# Patient Record
Sex: Male | Born: 1960 | Race: Black or African American | Hispanic: No | Marital: Married | Smoking: Never smoker
Health system: Southern US, Community
[De-identification: ages and names within clinical notes are randomized; demographics above are authoritative.]

## PROBLEM LIST (undated history)

## (undated) DIAGNOSIS — N400 Enlarged prostate without lower urinary tract symptoms: Secondary | ICD-10-CM

## (undated) DIAGNOSIS — G43909 Migraine, unspecified, not intractable, without status migrainosus: Secondary | ICD-10-CM

## (undated) DIAGNOSIS — C801 Malignant (primary) neoplasm, unspecified: Secondary | ICD-10-CM

---

## 1998-01-07 ENCOUNTER — Encounter: Payer: Self-pay | Admitting: Family Medicine

## 1998-01-07 ENCOUNTER — Ambulatory Visit (HOSPITAL_COMMUNITY): Admission: RE | Admit: 1998-01-07 | Discharge: 1998-01-07 | Payer: Self-pay | Admitting: Family Medicine

## 1998-01-17 ENCOUNTER — Ambulatory Visit (HOSPITAL_COMMUNITY): Admission: RE | Admit: 1998-01-17 | Discharge: 1998-01-17 | Payer: Self-pay | Admitting: Family Medicine

## 1998-01-17 ENCOUNTER — Encounter: Payer: Self-pay | Admitting: Family Medicine

## 1999-02-05 ENCOUNTER — Ambulatory Visit (HOSPITAL_COMMUNITY): Admission: RE | Admit: 1999-02-05 | Discharge: 1999-02-05 | Payer: Self-pay | Admitting: Family Medicine

## 2018-09-27 ENCOUNTER — Inpatient Hospital Stay (HOSPITAL_COMMUNITY)
Admission: EM | Admit: 2018-09-27 | Discharge: 2018-10-01 | DRG: 025 | Disposition: A | Discharge status: Home / Self Care | Payer: BC Managed Care – PPO | Attending: Neurosurgery | Admitting: Neurosurgery

## 2018-09-27 ENCOUNTER — Emergency Department (HOSPITAL_COMMUNITY): Payer: BC Managed Care – PPO

## 2018-09-27 ENCOUNTER — Encounter (HOSPITAL_COMMUNITY): Payer: Self-pay | Admitting: *Deleted

## 2018-09-27 ENCOUNTER — Inpatient Hospital Stay (HOSPITAL_COMMUNITY): Payer: BC Managed Care – PPO

## 2018-09-27 ENCOUNTER — Other Ambulatory Visit: Payer: Self-pay

## 2018-09-27 DIAGNOSIS — C719 Malignant neoplasm of brain, unspecified: Secondary | ICD-10-CM | POA: Diagnosis present

## 2018-09-27 DIAGNOSIS — Z20828 Contact with and (suspected) exposure to other viral communicable diseases: Secondary | ICD-10-CM | POA: Diagnosis present

## 2018-09-27 DIAGNOSIS — H53461 Homonymous bilateral field defects, right side: Secondary | ICD-10-CM | POA: Diagnosis present

## 2018-09-27 DIAGNOSIS — Z803 Family history of malignant neoplasm of breast: Secondary | ICD-10-CM

## 2018-09-27 DIAGNOSIS — N400 Enlarged prostate without lower urinary tract symptoms: Secondary | ICD-10-CM | POA: Diagnosis present

## 2018-09-27 DIAGNOSIS — C714 Malignant neoplasm of occipital lobe: Secondary | ICD-10-CM | POA: Diagnosis present

## 2018-09-27 DIAGNOSIS — G936 Cerebral edema: Secondary | ICD-10-CM | POA: Diagnosis present

## 2018-09-27 DIAGNOSIS — D496 Neoplasm of unspecified behavior of brain: Secondary | ICD-10-CM

## 2018-09-27 HISTORY — DX: Benign prostatic hyperplasia without lower urinary tract symptoms: N40.0

## 2018-09-27 LAB — APTT: aPTT: 33 seconds (ref 24–36)

## 2018-09-27 LAB — CBC WITH DIFFERENTIAL/PLATELET
Abs Immature Granulocytes: 0.04 10*3/uL (ref 0.00–0.07)
Basophils Absolute: 0 10*3/uL (ref 0.0–0.1)
Basophils Relative: 0 %
Eosinophils Absolute: 0 10*3/uL (ref 0.0–0.5)
Eosinophils Relative: 0 %
HCT: 42.4 % (ref 39.0–52.0)
Hemoglobin: 14.5 g/dL (ref 13.0–17.0)
Immature Granulocytes: 0 %
Lymphocytes Relative: 9 %
Lymphs Abs: 1 10*3/uL (ref 0.7–4.0)
MCH: 28.7 pg (ref 26.0–34.0)
MCHC: 34.2 g/dL (ref 30.0–36.0)
MCV: 83.8 fL (ref 80.0–100.0)
Monocytes Absolute: 0.6 10*3/uL (ref 0.1–1.0)
Monocytes Relative: 5 %
Neutro Abs: 9.1 10*3/uL — ABNORMAL HIGH (ref 1.7–7.7)
Neutrophils Relative %: 86 %
Platelets: 173 10*3/uL (ref 150–400)
RBC: 5.06 MIL/uL (ref 4.22–5.81)
RDW: 13.2 % (ref 11.5–15.5)
WBC: 10.7 10*3/uL — ABNORMAL HIGH (ref 4.0–10.5)
nRBC: 0 % (ref 0.0–0.2)

## 2018-09-27 LAB — BASIC METABOLIC PANEL WITH GFR
Anion gap: 11 (ref 5–15)
BUN: 11 mg/dL (ref 6–20)
CO2: 28 mmol/L (ref 22–32)
Calcium: 9 mg/dL (ref 8.9–10.3)
Chloride: 102 mmol/L (ref 98–111)
Creatinine, Ser: 1.28 mg/dL — ABNORMAL HIGH (ref 0.61–1.24)
GFR calc Af Amer: >60 mL/min
GFR calc non Af Amer: >60 mL/min
Glucose, Bld: 108 mg/dL — ABNORMAL HIGH (ref 70–99)
Potassium: 4.3 mmol/L (ref 3.5–5.1)
Sodium: 141 mmol/L (ref 135–145)

## 2018-09-27 LAB — C-REACTIVE PROTEIN: CRP: <0.8 mg/dL

## 2018-09-27 LAB — PROTIME-INR
INR: 1.2 (ref 0.8–1.2)
Prothrombin Time: 15.4 seconds — ABNORMAL HIGH (ref 11.4–15.2)

## 2018-09-27 LAB — SEDIMENTATION RATE: Sed Rate: 3 mm/hr (ref 0–16)

## 2018-09-27 IMAGING — CT CT CHEST WITH CONTRAST
2 of 5 series · 14 of 36 positions shown, 17 images · IV contrast (omnipaque)
Comparison: None.

CLINICAL DATA: Headache.  Left occipital mass.

EXAM:
CT CHEST, ABDOMEN, AND PELVIS WITH CONTRAST
TECHNIQUE: Multidetector CT imaging of the chest, abdomen and pelvis was
performed following the standard protocol during bolus
administration of intravenous contrast.
CONTRAST:  100mL OMNIPAQUE IOHEXOL 300 MG/ML  SOLN

[Series 3: cap with · axial · 0.73mm/px · z∈[-854,-254]mm · 11 of 137 slices shown, 14 images]
[im 9/137  mediastinal]
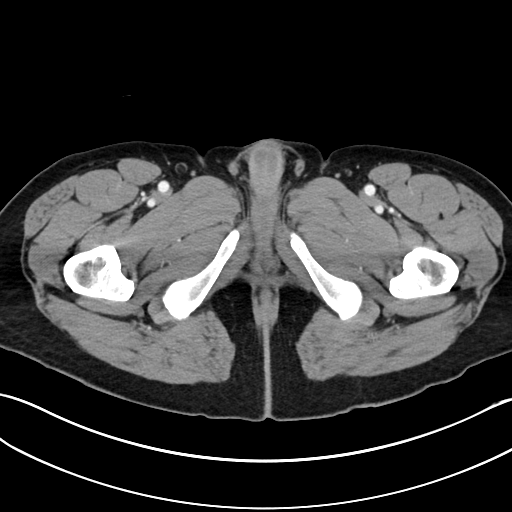
[im 9/137  lung]
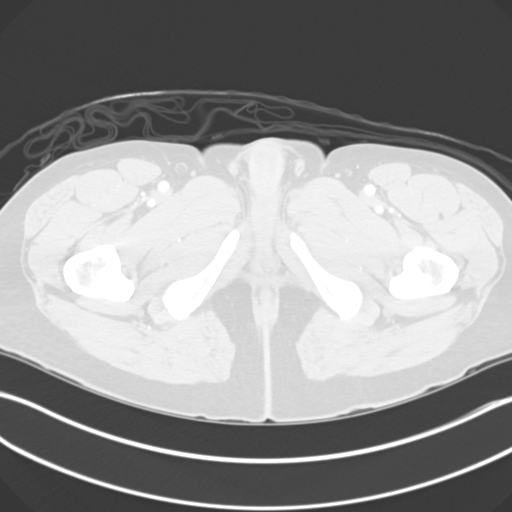
[im 25/137  lung]
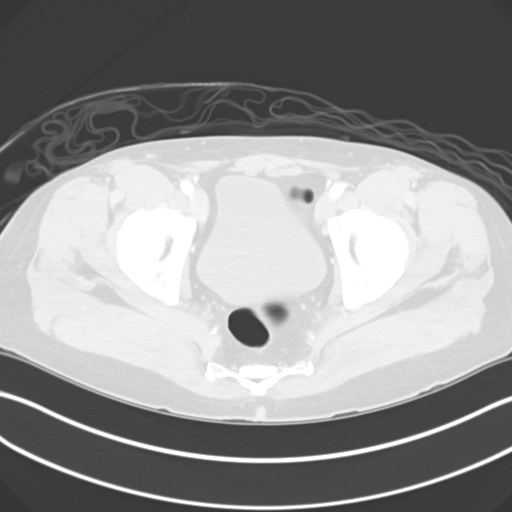
[im 33/137  lung]
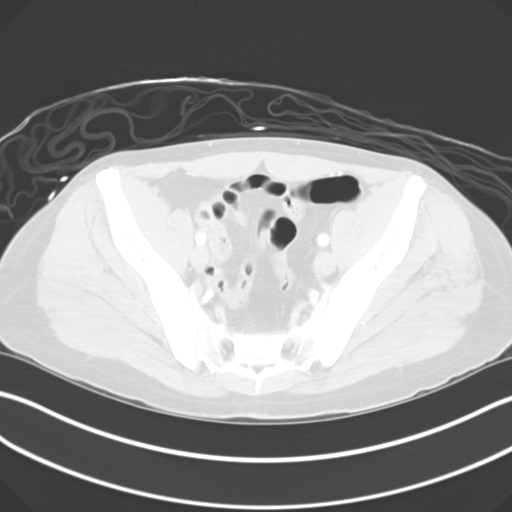
[im 49/137  lung]
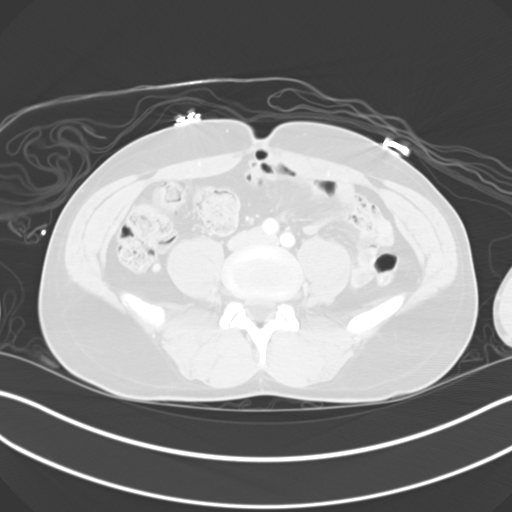
[im 57/137  mediastinal]
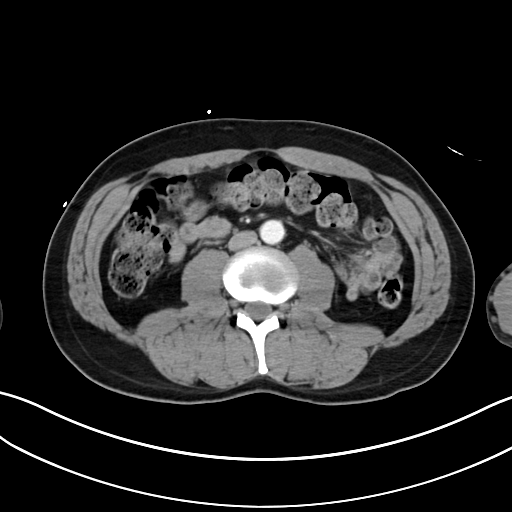
[im 57/137  lung]
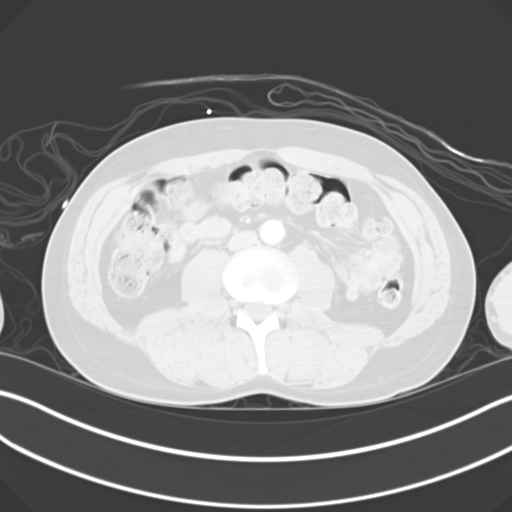
[im 73/137  lung]
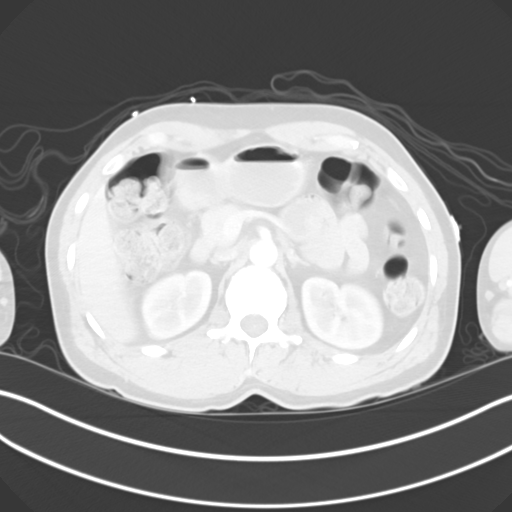
[im 81/137  lung]
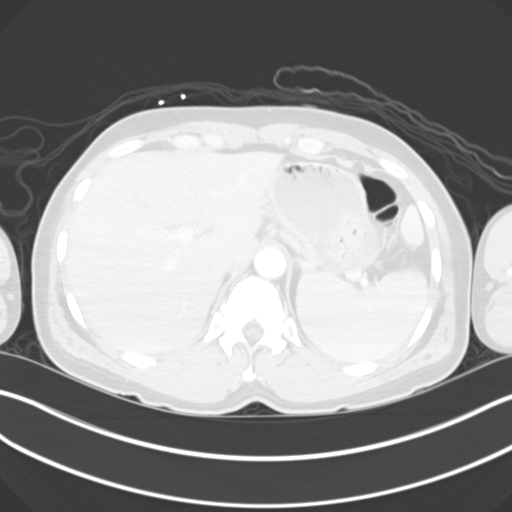
[im 89/137  lung]
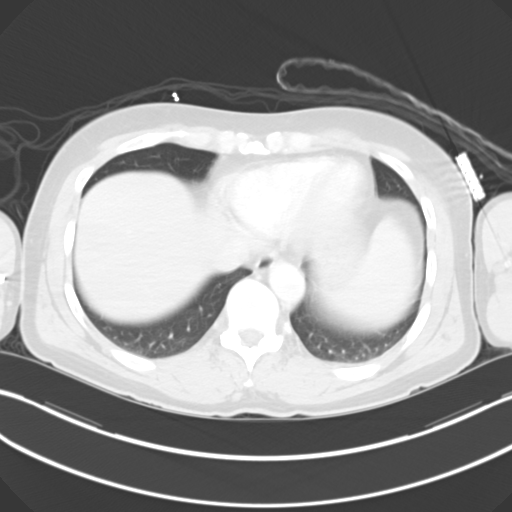
[im 105/137  mediastinal]
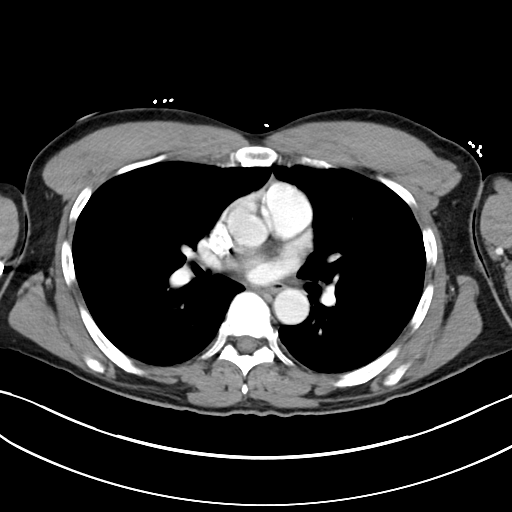
[im 105/137  lung]
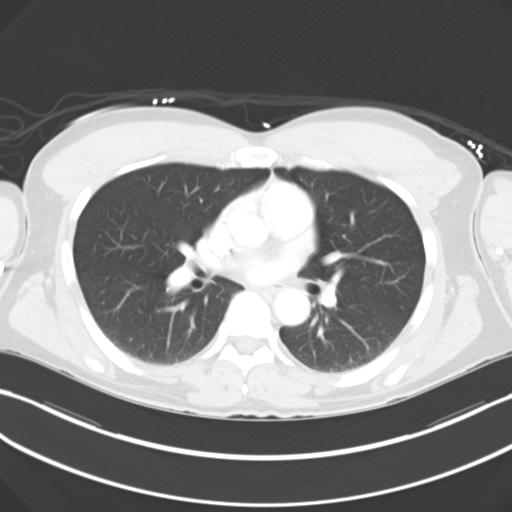
[im 113/137  lung]
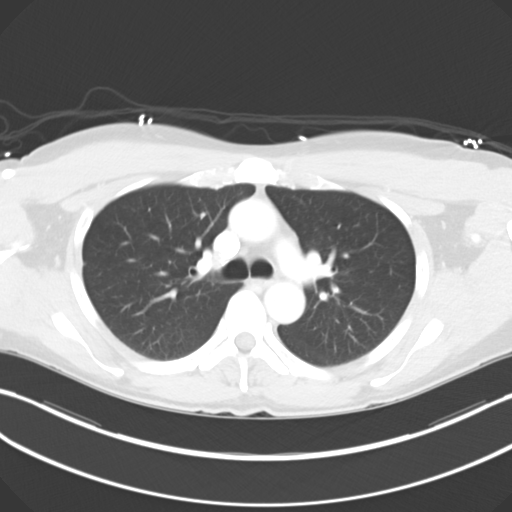
[im 129/137  lung]
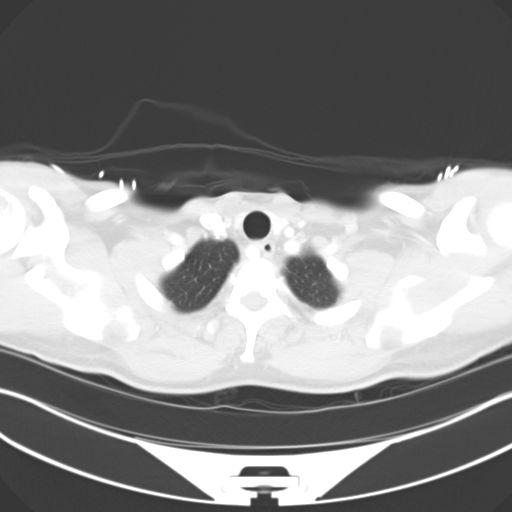

[Series 6: cor · coronal · 0.73mm/px · 3 of 88 slices shown]
[im 18/88  lung]
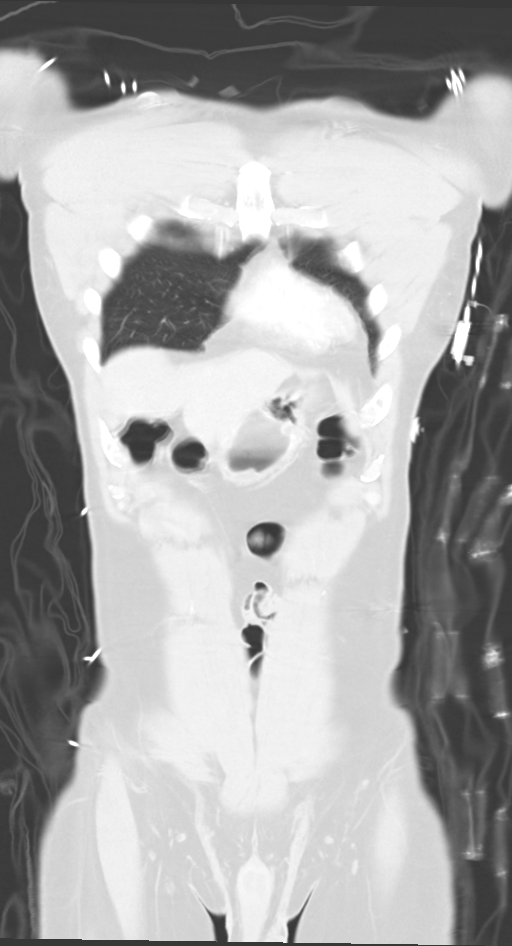
[im 35/88  lung]
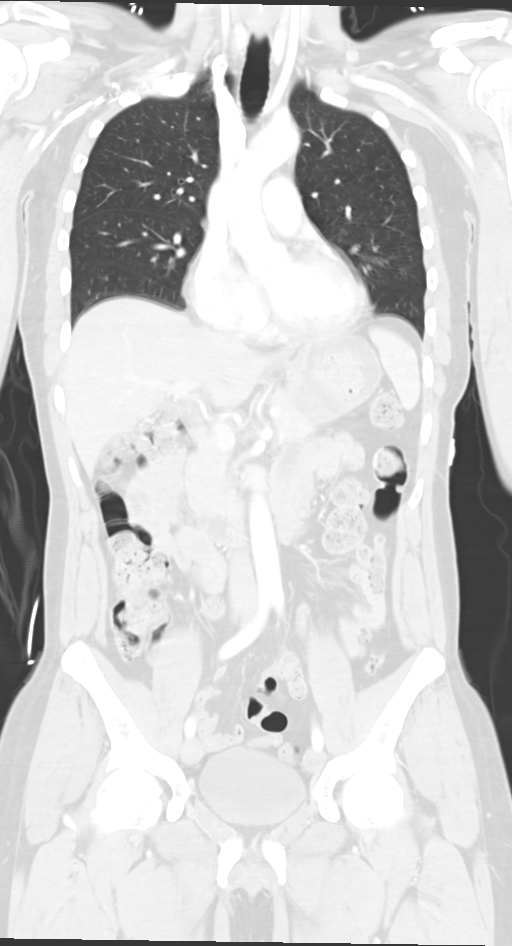
[im 53/88  lung]
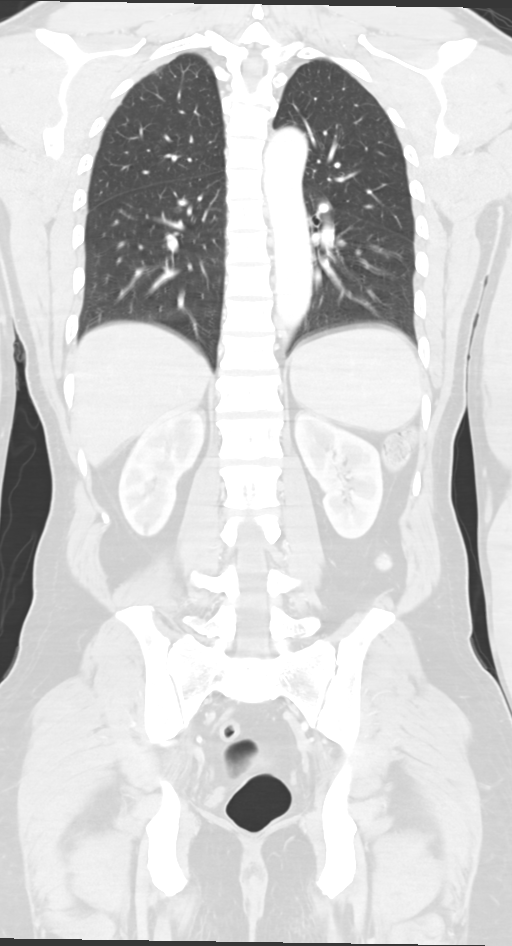

[14 of 36 positions shown; findings below may reference images not displayed]

FINDINGS: CT CHEST FINDINGS

Cardiovascular: Normal heart size.  No abnormal vascular finding.

Mediastinum/Nodes: No mass or adenopathy.

Lungs/Pleura: No pleural effusion. 3 mm subpleural nodule right
upper lobe image 13. 2 mm subpleural nodule right upper lobe image
26. No significant pulmonary finding.

Musculoskeletal: Normal

CT ABDOMEN PELVIS FINDINGS

Hepatobiliary: Normal

Pancreas: Normal

Spleen: Normal

Adrenals/Urinary Tract: Adrenal glands are normal. Kidneys are
normal. Bladder is normal.

Stomach/Bowel: No abnormal bowel finding.

Vascular/Lymphatic: Normal.  No atherosclerotic disease.

Reproductive: Normal

Other: No free fluid or air.

Musculoskeletal: Normal
IMPRESSION: Normal CT scan of the chest abdomen and pelvis.

## 2018-09-27 IMAGING — CT CT ABDOMEN AND PELVIS WITH CONTRAST
2 of 5 series · 15 of 46 positions shown, 17 images · IV contrast (omnipaque)
Comparison: None.

CLINICAL DATA: Headache.  Left occipital mass.

EXAM:
CT CHEST, ABDOMEN, AND PELVIS WITH CONTRAST
TECHNIQUE: Multidetector CT imaging of the chest, abdomen and pelvis was
performed following the standard protocol during bolus
administration of intravenous contrast.
CONTRAST:  100mL OMNIPAQUE IOHEXOL 300 MG/ML  SOLN

[Series 3: cap with · axial · 0.73mm/px · z∈[-844,-269]mm · 12 of 137 slices shown, 14 images]
[im 11/137  soft-tissue]
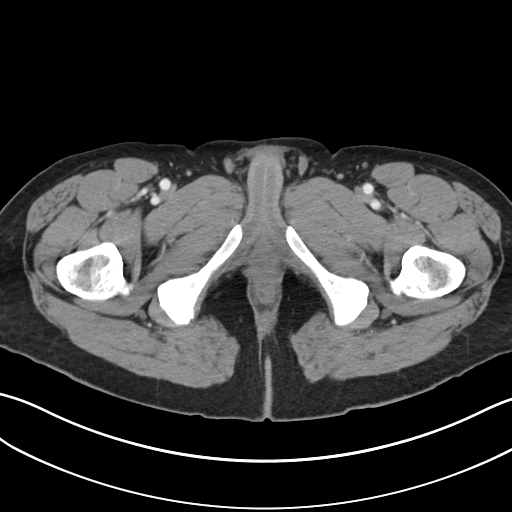
[im 11/137  bone]
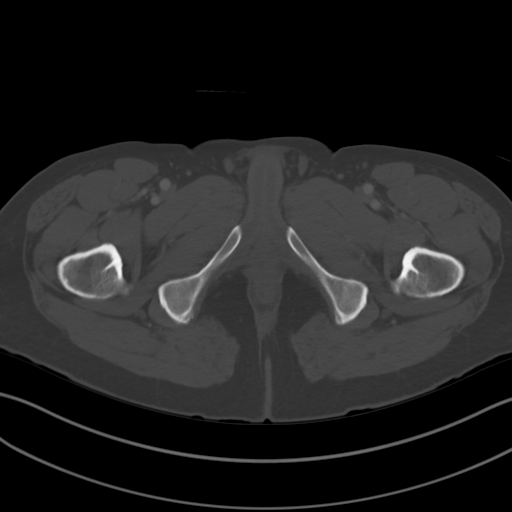
[im 21/137  soft-tissue]
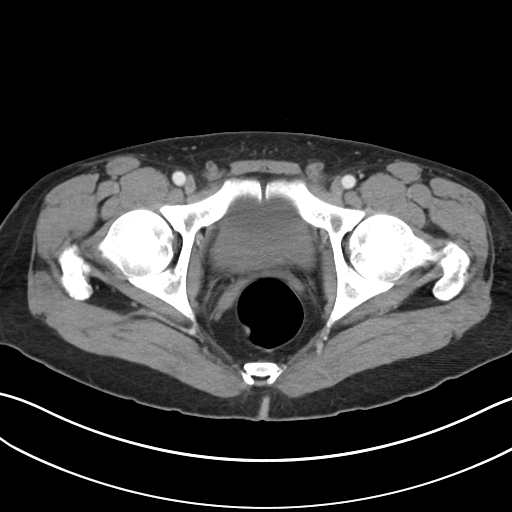
[im 32/137  soft-tissue]
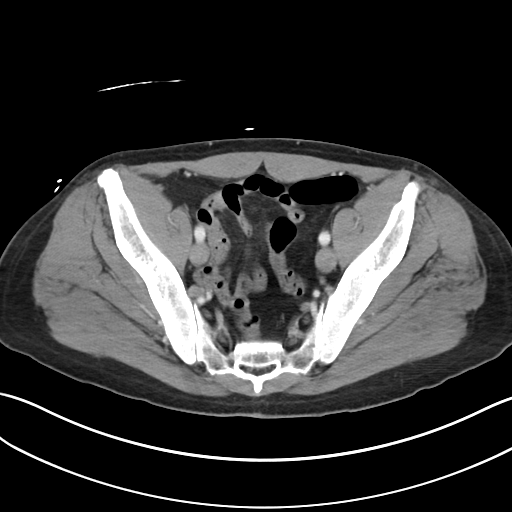
[im 42/137  soft-tissue]
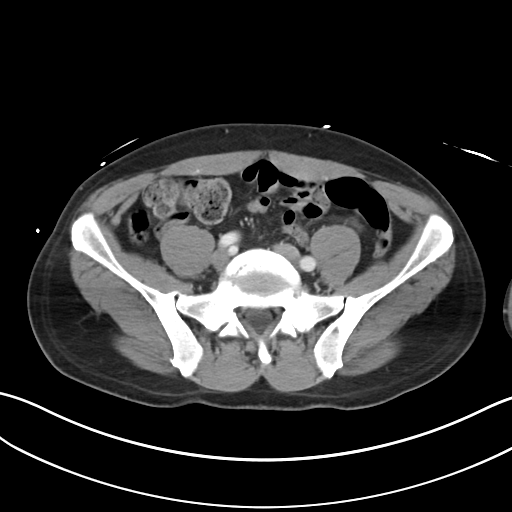
[im 53/137  soft-tissue]
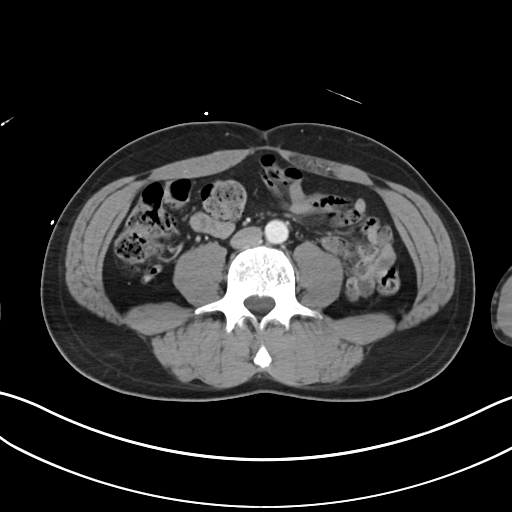
[im 63/137  soft-tissue]
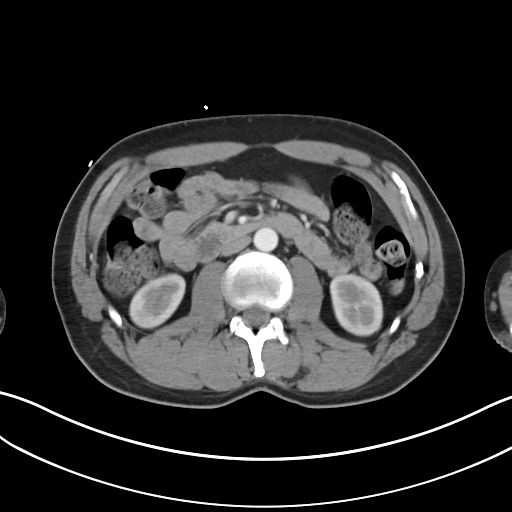
[im 74/137  soft-tissue]
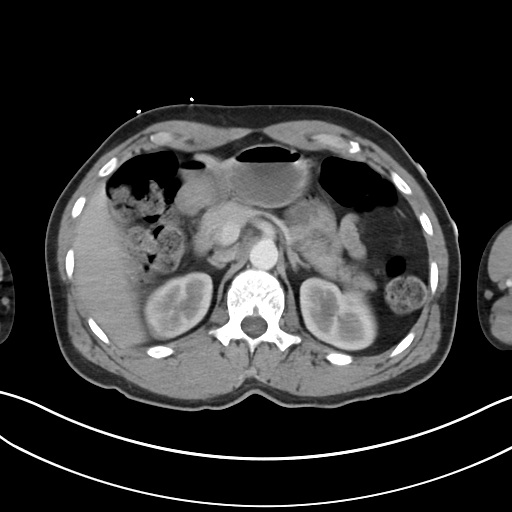
[im 84/137  soft-tissue]
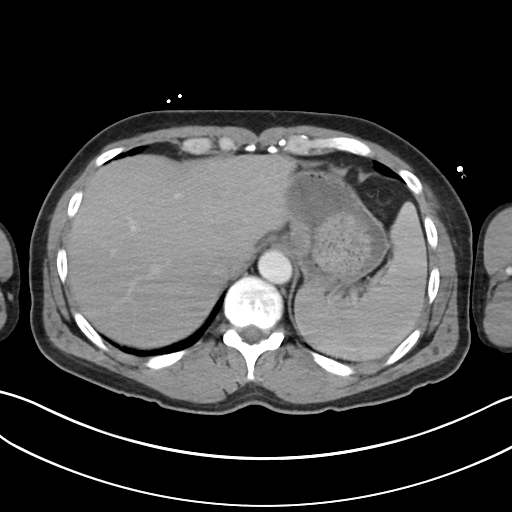
[im 95/137  soft-tissue]
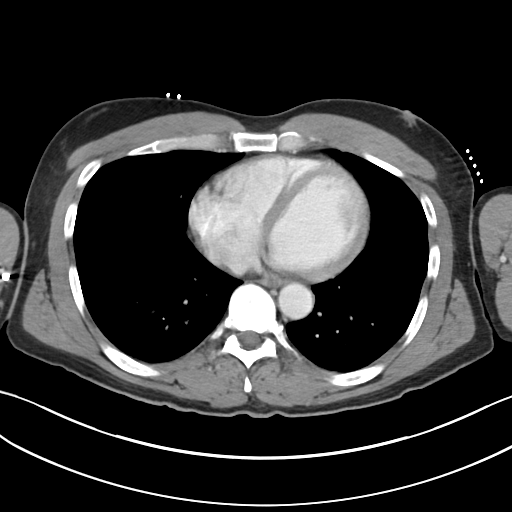
[im 95/137  bone]
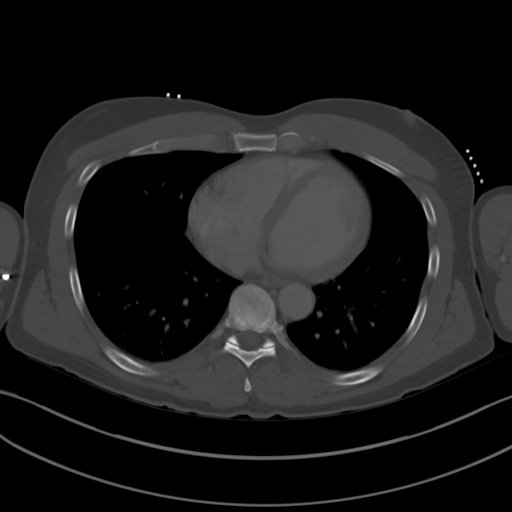
[im 105/137  soft-tissue]
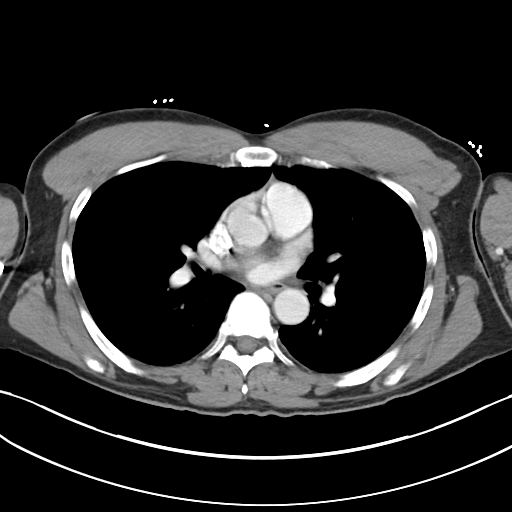
[im 116/137  soft-tissue]
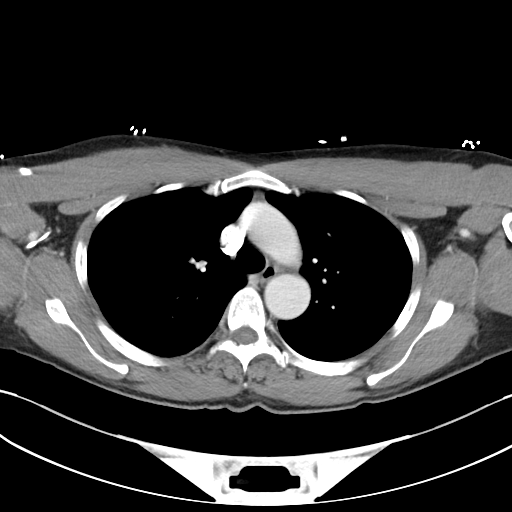
[im 126/137  soft-tissue]
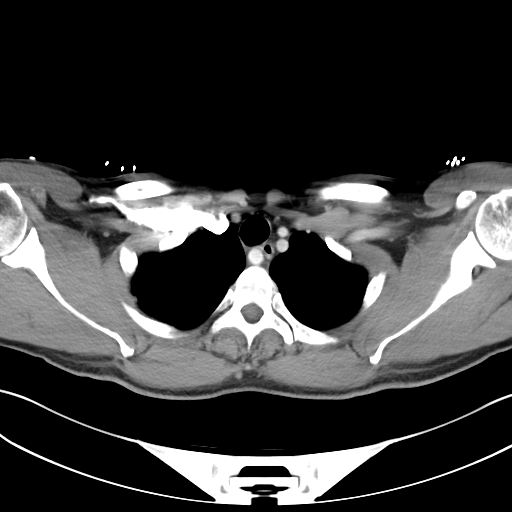

[Series 6: cor · coronal · 0.73mm/px · 3 of 88 slices shown]
[im 30/88  soft-tissue]
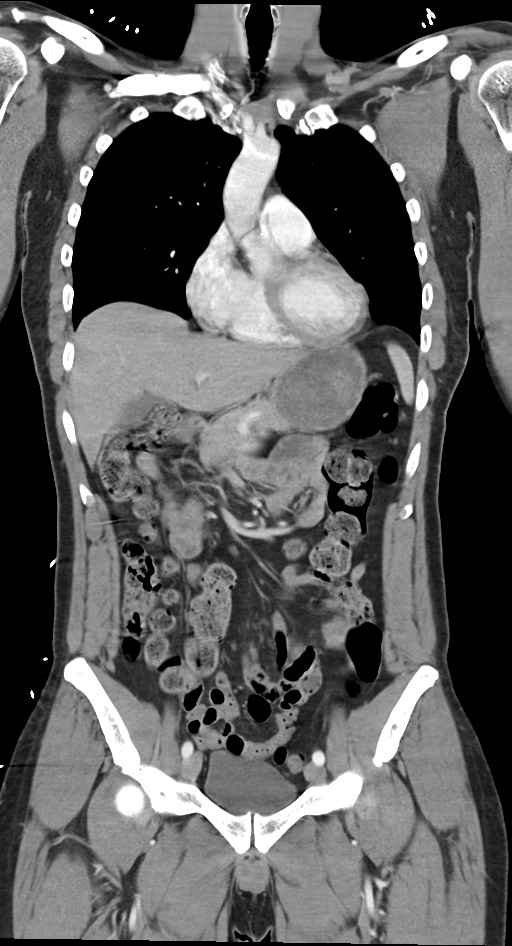
[im 39/88  soft-tissue]
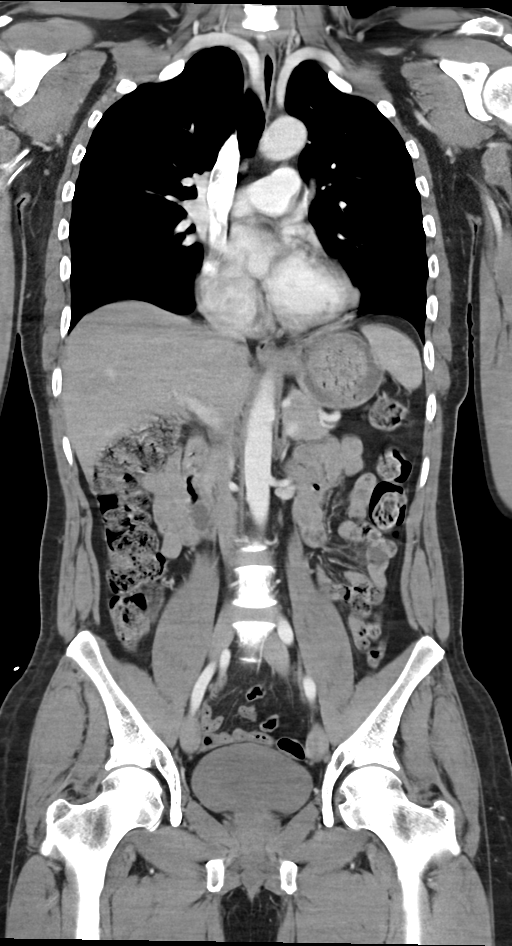
[im 49/88  soft-tissue]
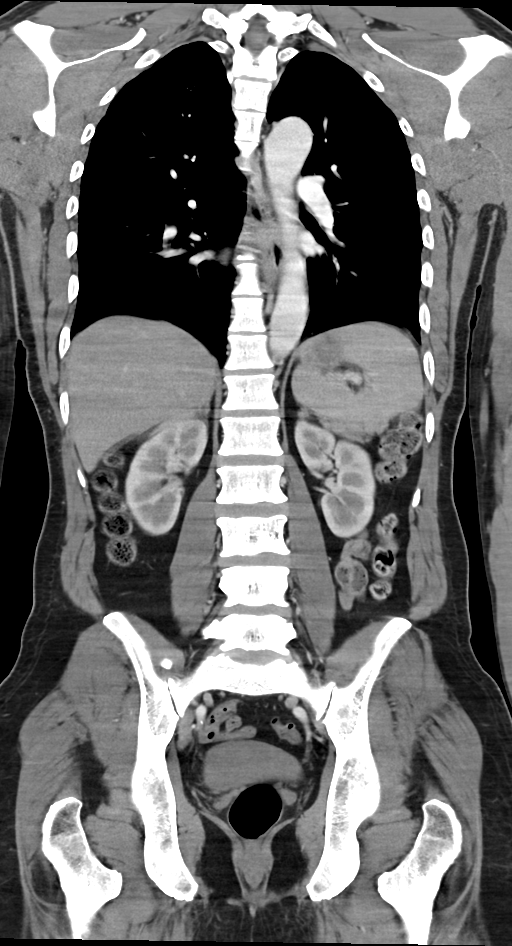

[15 of 46 positions shown; findings below may reference images not displayed]

FINDINGS: CT CHEST FINDINGS

Cardiovascular: Normal heart size.  No abnormal vascular finding.

Mediastinum/Nodes: No mass or adenopathy.

Lungs/Pleura: No pleural effusion. 3 mm subpleural nodule right
upper lobe image 13. 2 mm subpleural nodule right upper lobe image
26. No significant pulmonary finding.

Musculoskeletal: Normal

CT ABDOMEN PELVIS FINDINGS

Hepatobiliary: Normal

Pancreas: Normal

Spleen: Normal

Adrenals/Urinary Tract: Adrenal glands are normal. Kidneys are
normal. Bladder is normal.

Stomach/Bowel: No abnormal bowel finding.

Vascular/Lymphatic: Normal.  No atherosclerotic disease.

Reproductive: Normal

Other: No free fluid or air.

Musculoskeletal: Normal
IMPRESSION: Normal CT scan of the chest abdomen and pelvis.

## 2018-09-27 IMAGING — CT CT HEAD WITHOUT CONTRAST
4 series · 16 of 47 positions shown, 18 images · non-contrast
Comparison: Earlier today, [DATE]

CLINICAL DATA: Headache.  Neoplasm, surgical planning

EXAM:
CT HEAD WITHOUT CONTRAST
TECHNIQUE: Contiguous axial images were obtained from the base of the skull
through the vertex without intravenous contrast.

[Series 3: head wo · axial · 0.47mm/px · z∈[-102,+18]mm · 7 of 34 slices shown, 9 images]
[im 5/34  brain]
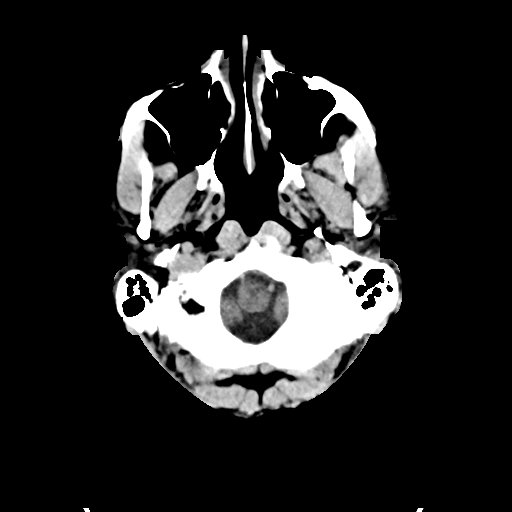
[im 5/34  bone]
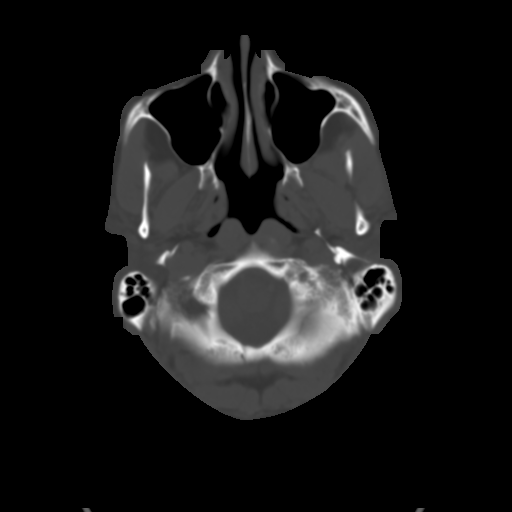
[im 9/34  brain]
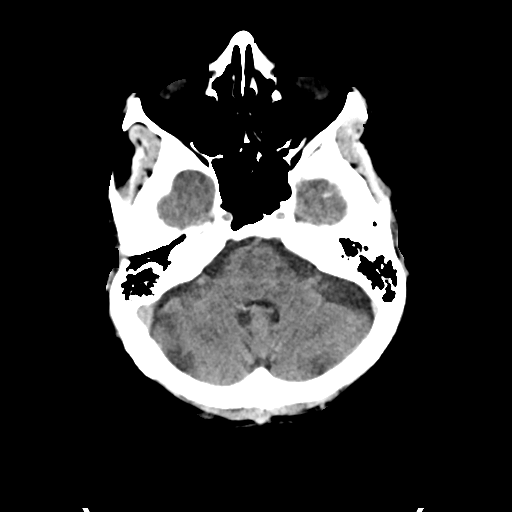
[im 13/34  brain]
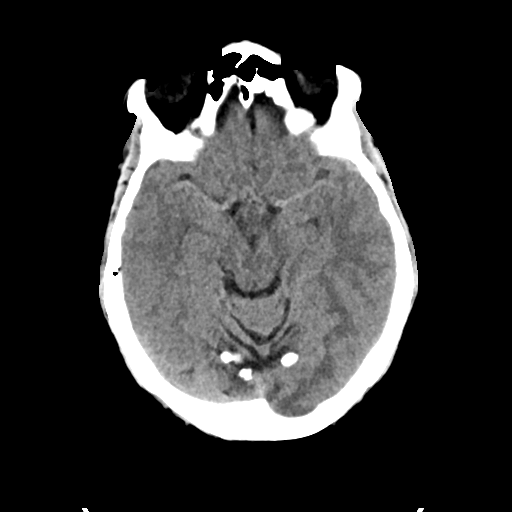
[im 17/34  brain]
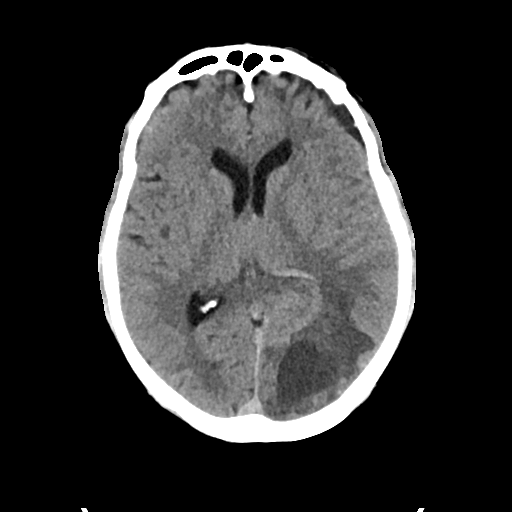
[im 21/34  brain]
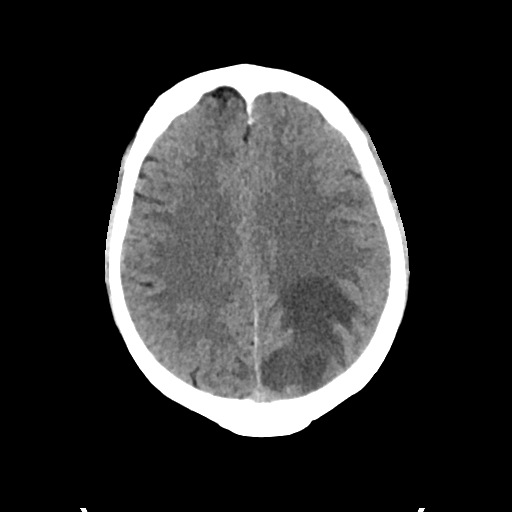
[im 21/34  bone]
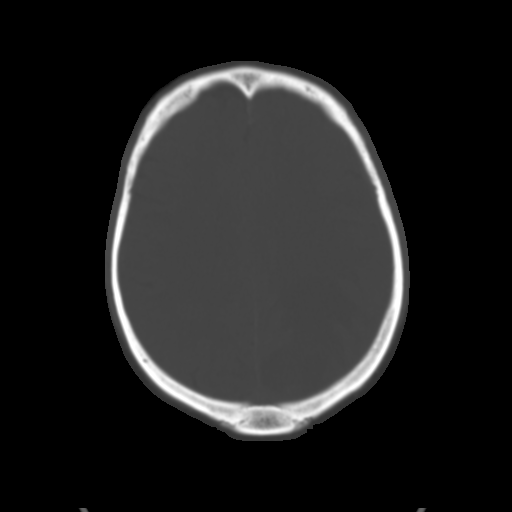
[im 25/34  brain]
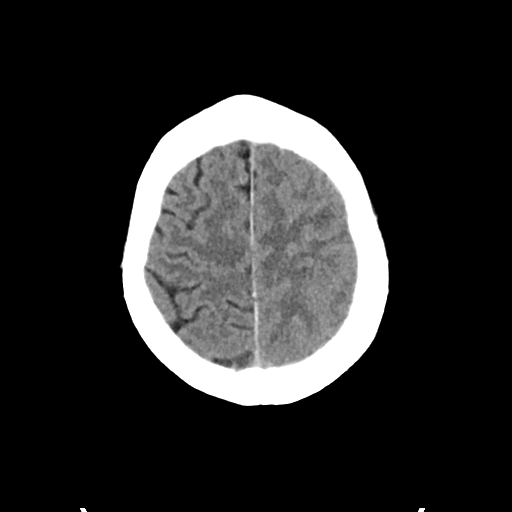
[im 29/34  brain]
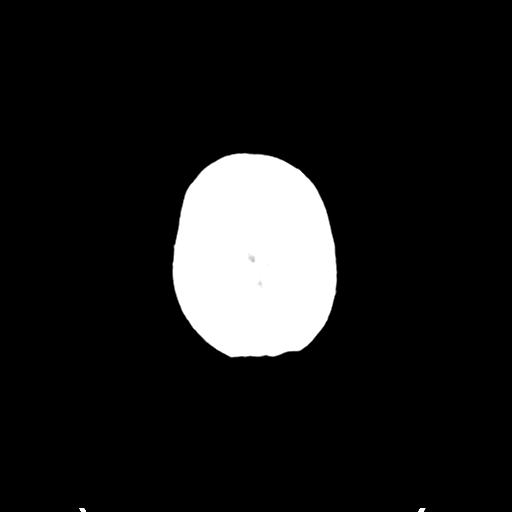

[Series 4: head bone · axial · 0.47mm/px · z∈[-106,-74]mm · 3 of 84 slices shown]
[im 9/84  bone]
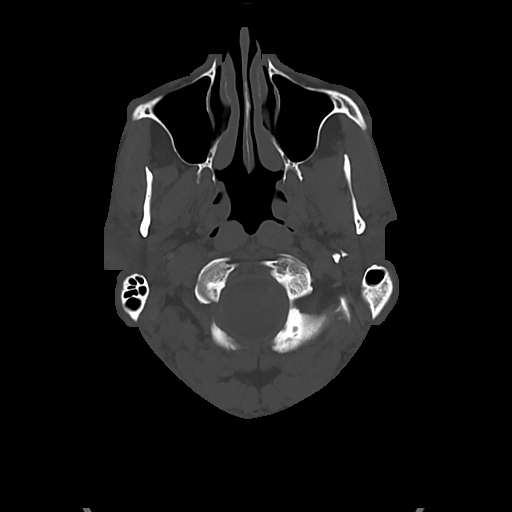
[im 17/84  bone]
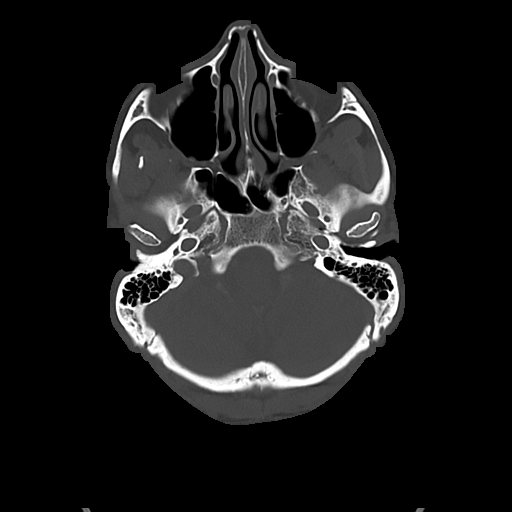
[im 25/84  bone]
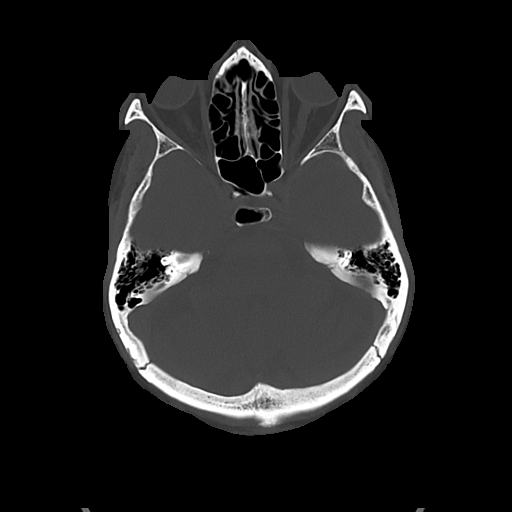

[Series 5: cor soft · coronal · 0.33mm/px · 3 of 75 slices shown]
[im 25/75  brain]
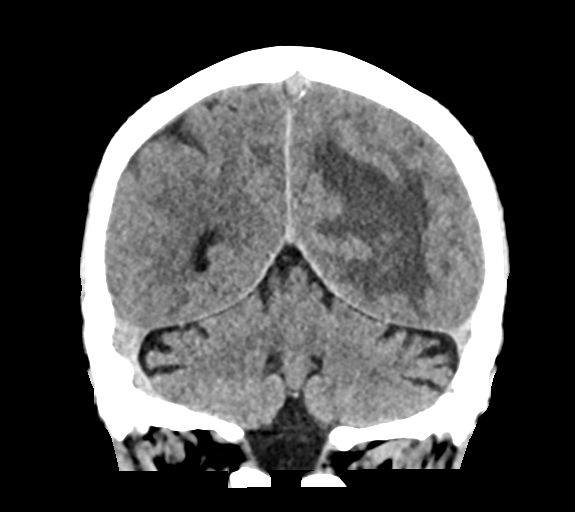
[im 33/75  brain]
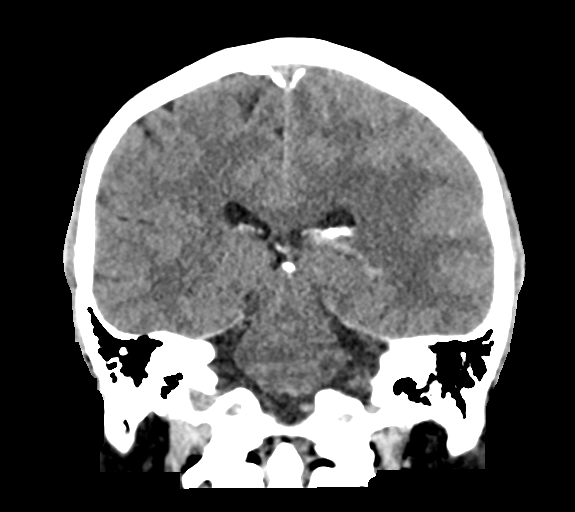
[im 42/75  brain]
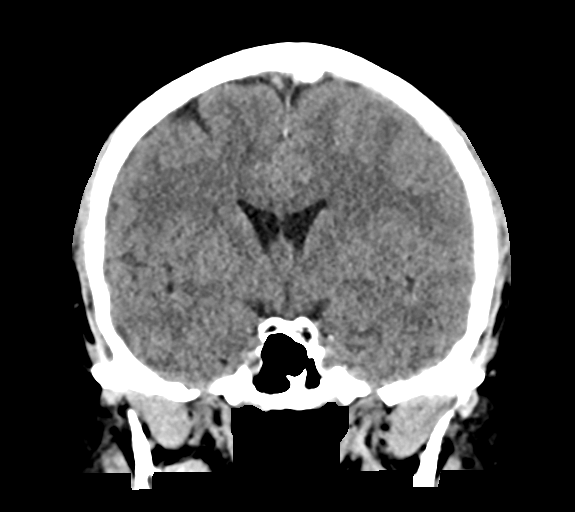

[Series 6: sag soft · sagittal · 0.32mm/px · 3 of 63 slices shown]
[im 21/63  brain]
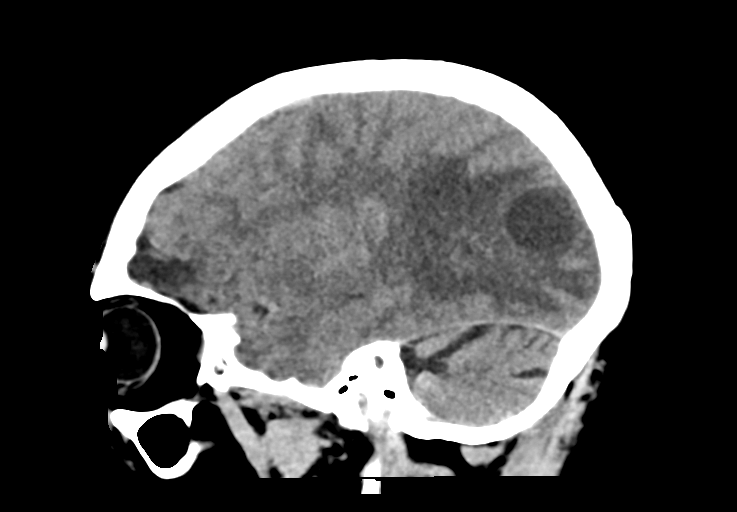
[im 32/63  brain]
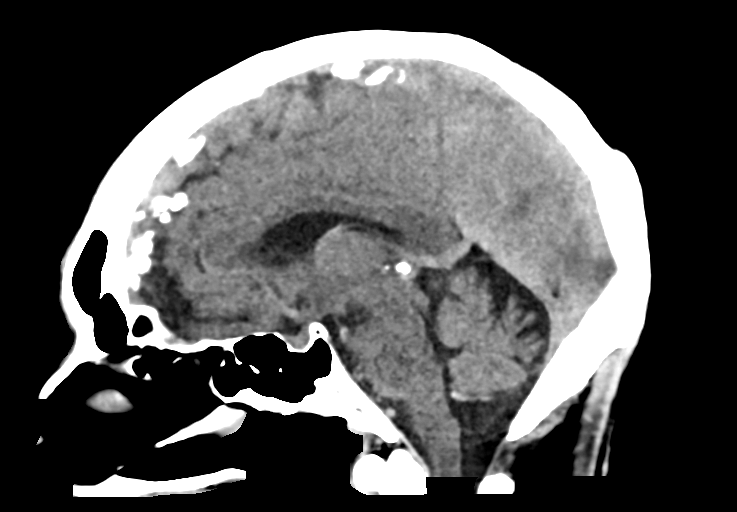
[im 42/63  brain]
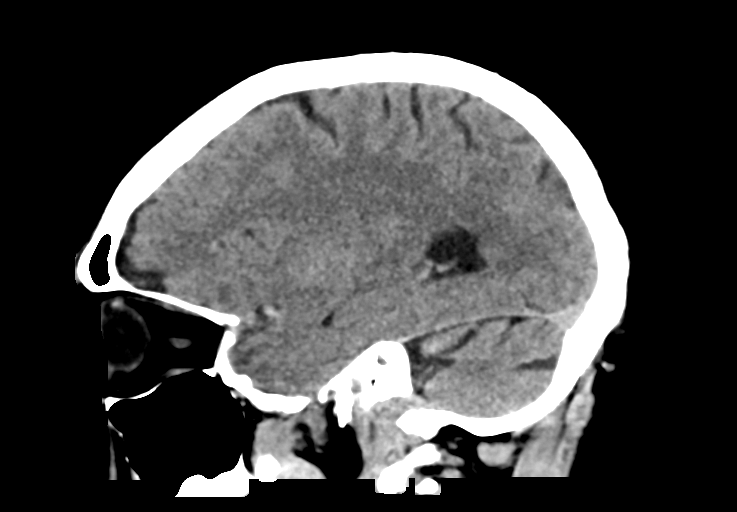

[16 of 47 positions shown; findings below may reference images not displayed]

FINDINGS: Brain: Again noted is the large necrotic mass in the left occipital
lobe, unchanged since prior study. No hemorrhage. Extensive
surrounding vasogenic edema. Mass effect on the occipital horn of
the left lateral ventricle. No acute infarct or hydrocephalus. No
hemorrhage. No acute infarct. No hydrocephalus.

Vascular: No hyperdense vessel or unexpected calcification.

Skull: No acute calvarial abnormality.

Sinuses/Orbits: Visualized paranasal sinuses and mastoids clear.
Orbital soft tissues unremarkable.

Other: None
IMPRESSION: Stable appearance of the necrotic mass in the left occipital lobe
with extensive surrounding vasogenic edema.

## 2018-09-27 IMAGING — CT CT HEAD WITHOUT CONTRAST
4 series · 15 of 47 positions shown, 17 images · non-contrast
Comparison: None.

CLINICAL DATA: Headache and dizziness

EXAM:
CT HEAD WITHOUT CONTRAST
TECHNIQUE: Contiguous axial images were obtained from the base of the skull
through the vertex without intravenous contrast.

[Series 3: head without · axial · non-contrast · 0.44mm/px · z∈[-70,+50]mm · 7 of 32 slices shown, 9 images]
[im 4/32  brain]
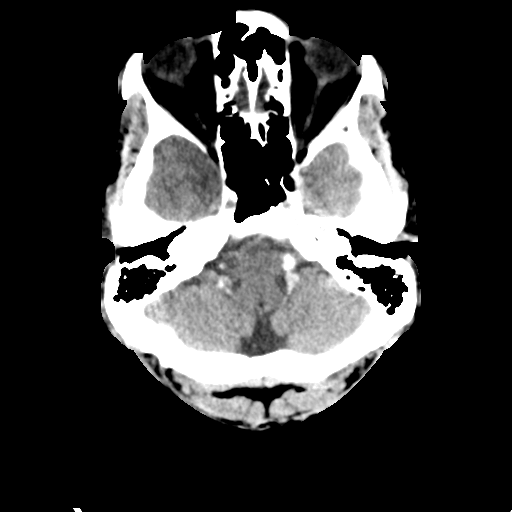
[im 4/32  bone]
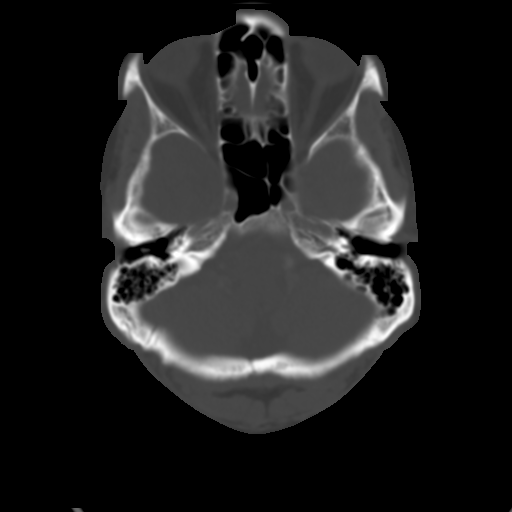
[im 8/32  brain]
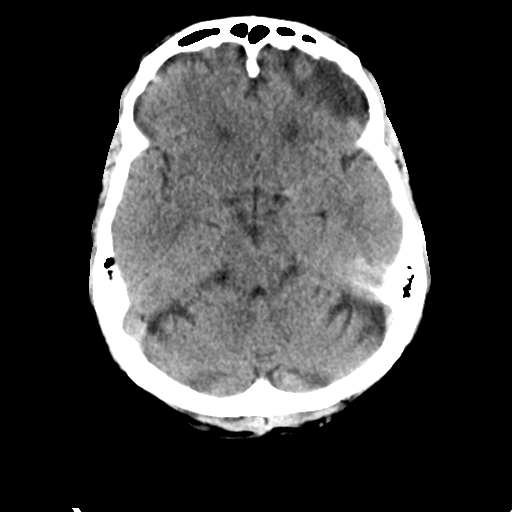
[im 12/32  brain]
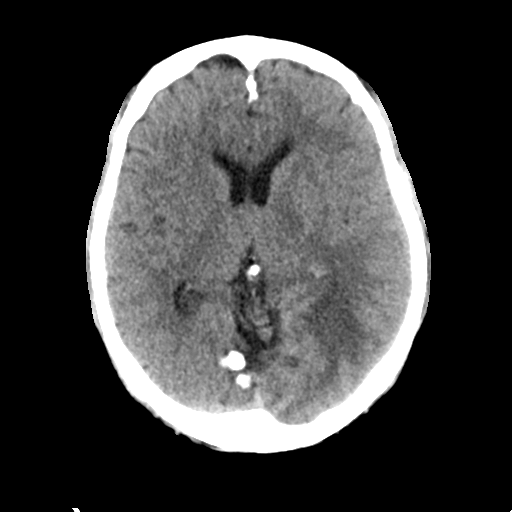
[im 16/32  brain]
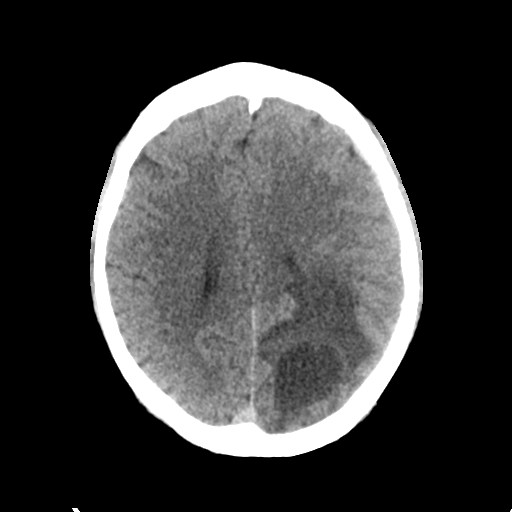
[im 20/32  brain]
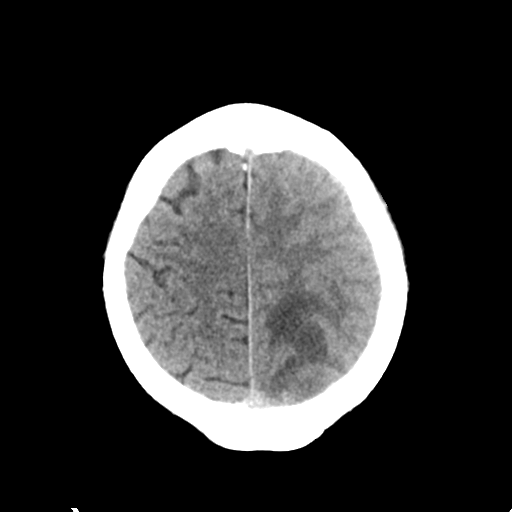
[im 20/32  bone]
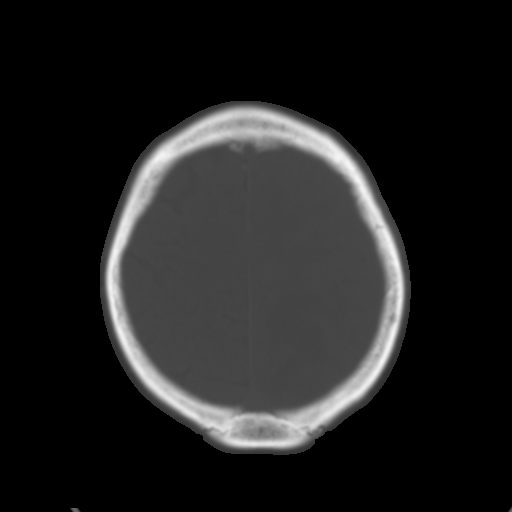
[im 24/32  brain]
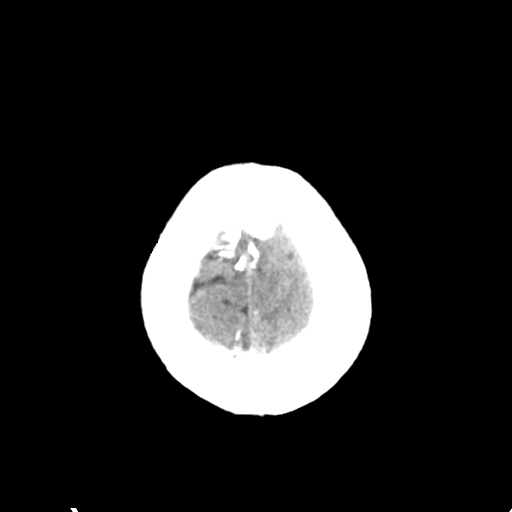
[im 28/32  brain]
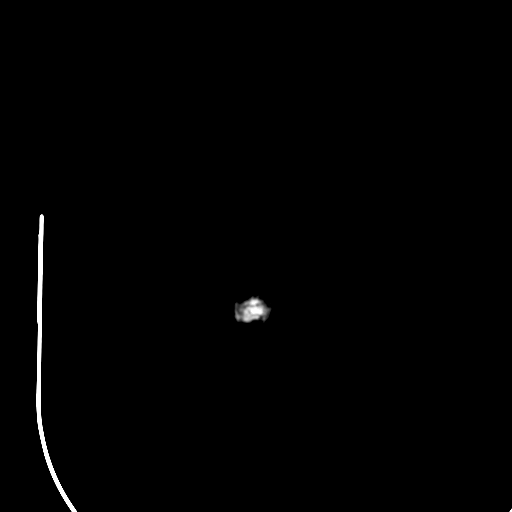

[Series 4: head bone · axial · 0.44mm/px · z∈[-71,-55]mm · 2 of 78 slices shown]
[im 8/78  bone]
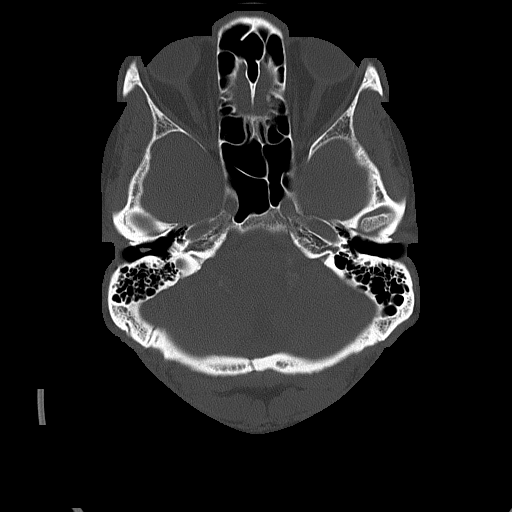
[im 16/78  bone]
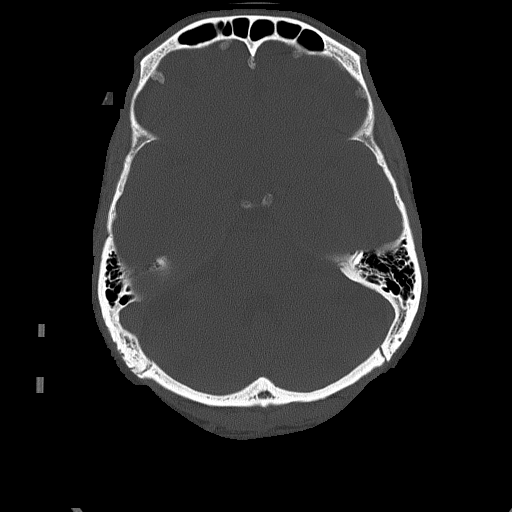

[Series 5: head without cor · coronal · non-contrast · 0.30mm/px · 3 of 77 slices shown]
[im 26/77  brain]
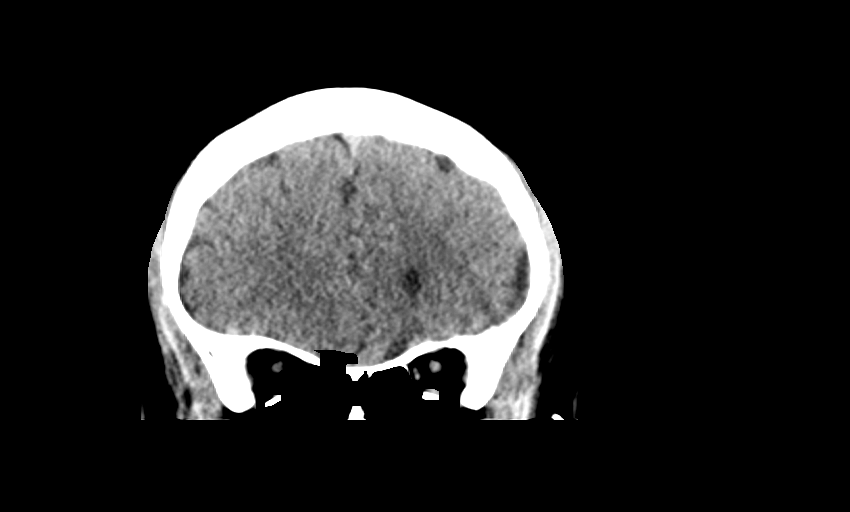
[im 34/77  brain]
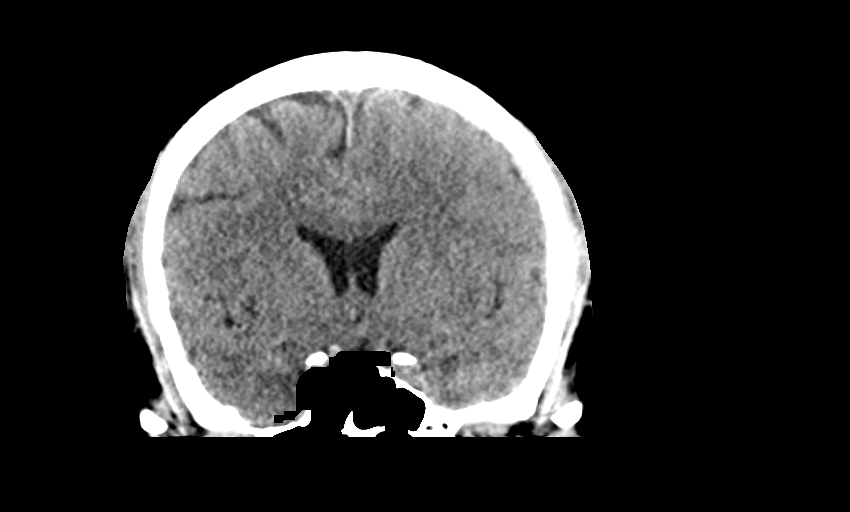
[im 43/77  brain]
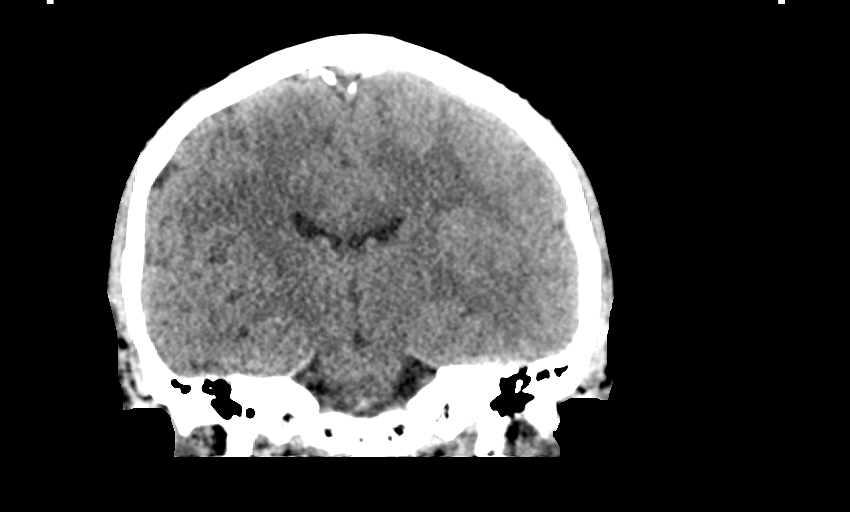

[Series 6: head without sag · sagittal · non-contrast · 0.40mm/px · 3 of 67 slices shown]
[im 23/67  brain]
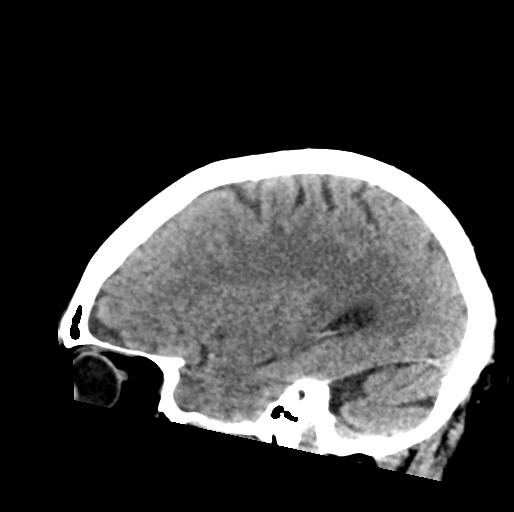
[im 34/67  brain]
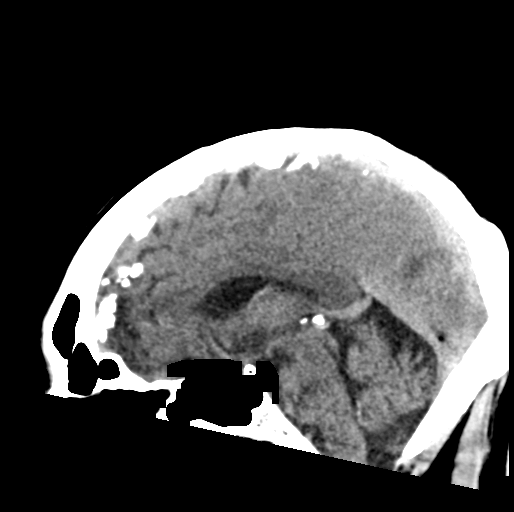
[im 45/67  brain]
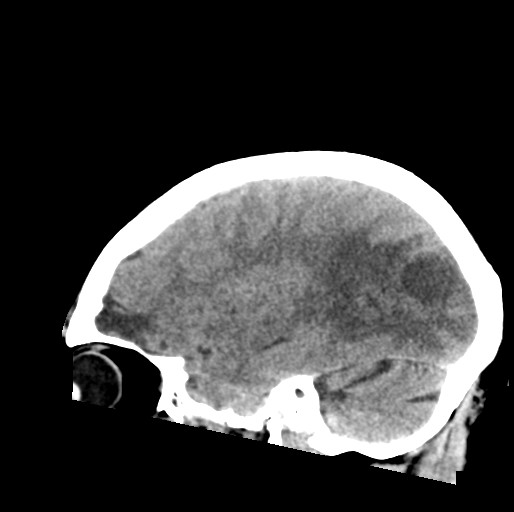

[15 of 47 positions shown; findings below may reference images not displayed]

FINDINGS: Brain: The ventricles appear normal in size and configuration. There
is extensive vasogenic edema throughout much of the left occipital
and inferior to mid parietal lobe region. There is a somewhat cystic
appearing area within this area of vasogenic edema measuring 3.2 x
2.8 cm. There is apparent edema throughout the superior left frontal
and parietal lobes with relative of effacement of sulci in these
areas compared to the normal-appearing right side. There is no
midline shift. No subdural or epidural fluid collections. No acute
hemorrhage evident.

There is decreased attenuation in the left anterior inferior frontal
lobe, a likely prior infarct.

Vascular: No hyperdense vessel. No vascular calcifications are
evident.

Skull: Bony calvarium appears intact.

Sinuses/Orbits: There is mucosal thickening in several ethmoid air
cells. Other visualized paranasal sinuses are clear. Visualized
orbits appear symmetric bilaterally.

Other: Visualized mastoid air cells are clear.
IMPRESSION: 1. Cystic appearing area in the left occipital lobe posteriorly with
extensive vasogenic edema in this area. Suspect mass or possible
abscess in this area. Advise pre and post-contrast brain MRI to
further evaluate.

2. Apparent edema throughout the superior left frontal and parietal
lobes with loss of sulci. No well-defined mass in this area. Again,
MR pre and postcontrast may be helpful for further assessment this
region, in particular to assess for potential additional masslike
lesions.

3. Apparent prior infarct in the anterior inferior left frontal
lobe.

4.  No acute hemorrhage evident.  No midline shift.

5.  Mucosal thickening noted in several ethmoid air cells.

These results were called by telephone at the time of interpretation
on [DATE] at [DATE] to BRIJESH, PA , who verbally
acknowledged these results.

## 2018-09-27 MED ORDER — ZOLPIDEM TARTRATE 5 MG PO TABS: 5.0000 mg | ORAL_TABLET | Freq: Every evening | ORAL | Status: DC | PRN

## 2018-09-27 MED ORDER — MORPHINE SULFATE (PF) 2 MG/ML IV SOLN
2.0000 mg | Freq: Once | INTRAVENOUS | Status: AC
  Administered 2018-09-27: 2 mg via INTRAVENOUS
  Filled 2018-09-27: qty 1

## 2018-09-27 MED ORDER — SODIUM CHLORIDE 0.9% FLUSH: 3.0000 mL | INTRAVENOUS | Status: DC | PRN

## 2018-09-27 MED ORDER — FINASTERIDE 5 MG PO TABS
5.0000 mg | ORAL_TABLET | Freq: Every day | ORAL | Status: DC
  Administered 2018-09-28 – 2018-10-01 (×4): 5 mg via ORAL
  Filled 2018-09-27 (×5): qty 1

## 2018-09-27 MED ORDER — DIPHENHYDRAMINE HCL 50 MG/ML IJ SOLN
12.5000 mg | Freq: Once | INTRAMUSCULAR | Status: AC
  Administered 2018-09-27: 12.5 mg via INTRAVENOUS
  Filled 2018-09-27: qty 1

## 2018-09-27 MED ORDER — SODIUM CHLORIDE 0.9 % IV SOLN: 250.0000 mL | INTRAVENOUS | Status: DC | PRN

## 2018-09-27 MED ORDER — SODIUM CHLORIDE 0.9 % IV SOLN
INTRAVENOUS | Status: DC
  Administered 2018-09-27: 22:00:00 via INTRAVENOUS

## 2018-09-27 MED ORDER — FLEET ENEMA 7-19 GM/118ML RE ENEM: 1.0000 | ENEMA | Freq: Once | RECTAL | Status: DC | PRN

## 2018-09-27 MED ORDER — SENNOSIDES-DOCUSATE SODIUM 8.6-50 MG PO TABS: 1.0000 | ORAL_TABLET | Freq: Every evening | ORAL | Status: DC | PRN

## 2018-09-27 MED ORDER — DOCUSATE SODIUM 100 MG PO CAPS
100.0000 mg | ORAL_CAPSULE | Freq: Two times a day (BID) | ORAL | Status: DC
  Administered 2018-10-01: 08:00:00 100 mg via ORAL
  Filled 2018-09-27 (×3): qty 1

## 2018-09-27 MED ORDER — LORAZEPAM 2 MG/ML IJ SOLN
1.0000 mg | Freq: Once | INTRAMUSCULAR | Status: AC
  Administered 2018-09-27: 1 mg via INTRAVENOUS
  Filled 2018-09-27: qty 1

## 2018-09-27 MED ORDER — GADOBUTROL 1 MMOL/ML IV SOLN
10.0000 mL | Freq: Once | INTRAVENOUS | Status: AC | PRN
  Administered 2018-09-27: 18:00:00 10 mL via INTRAVENOUS

## 2018-09-27 MED ORDER — IOHEXOL 300 MG/ML  SOLN
100.0000 mL | Freq: Once | INTRAMUSCULAR | Status: AC | PRN
  Administered 2018-09-27: 22:00:00 100 mL via INTRAVENOUS

## 2018-09-27 MED ORDER — METOCLOPRAMIDE HCL 5 MG/ML IJ SOLN
10.0000 mg | Freq: Once | INTRAMUSCULAR | Status: AC
  Administered 2018-09-27: 10 mg via INTRAVENOUS
  Filled 2018-09-27: qty 2

## 2018-09-27 MED ORDER — ONDANSETRON HCL 4 MG PO TABS: 4.0000 mg | ORAL_TABLET | Freq: Four times a day (QID) | ORAL | Status: DC | PRN

## 2018-09-27 MED ORDER — ACETAMINOPHEN 325 MG PO TABS: 650.0000 mg | ORAL_TABLET | Freq: Four times a day (QID) | ORAL | Status: DC | PRN

## 2018-09-27 MED ORDER — ACETAMINOPHEN 650 MG RE SUPP: 650.0000 mg | Freq: Four times a day (QID) | RECTAL | Status: DC | PRN

## 2018-09-27 MED ORDER — DEXAMETHASONE SODIUM PHOSPHATE 10 MG/ML IJ SOLN
10.0000 mg | Freq: Once | INTRAMUSCULAR | Status: AC
  Administered 2018-09-29: 17:00:00 10 mg via INTRAVENOUS
  Filled 2018-09-27 (×2): qty 1

## 2018-09-27 MED ORDER — DEXAMETHASONE SODIUM PHOSPHATE 4 MG/ML IJ SOLN
4.0000 mg | Freq: Four times a day (QID) | INTRAMUSCULAR | Status: DC
  Administered 2018-09-27 – 2018-10-01 (×14): 4 mg via INTRAVENOUS
  Filled 2018-09-27 (×15): qty 1

## 2018-09-27 MED ORDER — BISACODYL 5 MG PO TBEC: 5.0000 mg | DELAYED_RELEASE_TABLET | Freq: Every day | ORAL | Status: DC | PRN

## 2018-09-27 MED ORDER — SODIUM CHLORIDE 0.9% FLUSH
3.0000 mL | Freq: Two times a day (BID) | INTRAVENOUS | Status: DC
  Administered 2018-09-28 – 2018-10-01 (×4): 3 mL via INTRAVENOUS

## 2018-09-27 MED ORDER — TAMSULOSIN HCL 0.4 MG PO CAPS
0.4000 mg | ORAL_CAPSULE | Freq: Every day | ORAL | Status: DC
  Administered 2018-09-27 – 2018-10-01 (×5): 0.4 mg via ORAL
  Filled 2018-09-27 (×5): qty 1

## 2018-09-27 MED ORDER — ONDANSETRON HCL 4 MG/2ML IJ SOLN
4.0000 mg | Freq: Four times a day (QID) | INTRAMUSCULAR | Status: DC | PRN
  Administered 2018-09-29: 4 mg via INTRAVENOUS

## 2018-09-27 MED ORDER — HYDROCODONE-ACETAMINOPHEN 5-325 MG PO TABS: 1.0000 | ORAL_TABLET | ORAL | Status: DC | PRN

## 2018-09-27 NOTE — ED Notes (Signed)
Pt ambulated to bathroom with no assistance.  

## 2018-09-27 NOTE — ED Triage Notes (Signed)
Pt reports severe headaches for several weeks. Has been seen by pcp and given prednisone, return of headache yesterday. Reports blurred vision with the headache but denies any n/v, fever or other neuro deficits.

## 2018-09-27 NOTE — Consult Note (Deleted)
Chief Complaint   Chief Complaint  Patient presents with   Headache    HPI   Consult requested by: [facility] Reason for consult: brain mass  HPI: Cody [name] is a 58 y.o. male with history of BPH who presented to ED with a severe headache. He started having headaches several months ago, which is abnormal for him. He went to his PCP and was rx steroid dose pack. While on steroids, he had complete resolution of headache. Once he completed the steroids, the headache gradually returned over the course of 1-2 weeks. Due to persistent pain, he followed back up with his PCP approx 3 weeks ago, and was rx a second round of steroids and oxycodone for break through pain. Again, while on steroids, HA completely resolved until about 1 week ago or so. Describes left sided occipitoparietal headache. Pain fluctuates in severity, although never interferes with ADLs. Associated with intermittent dizziness. No associated changes in vision, nausea/vomiting, weakness, N/T, gait instability. Because the headache was so severe, he decided to come to ED to be evaluated. He underwent head CT which revealed a left occipital cystic like mass with extensive vasogenic edema. NSY consultation requested.   He has no personal history of cancer. 2 Sisters have both had breast cancer. He is up to date on all routine health maintenance including colonoscopy. Not on blood thinning agents. Denies issues with memory, naming, etc.  There are no active problems to display for this patient.   PMH: Past Medical History:  Diagnosis Date   Enlarged prostate     PSH: History reviewed. No pertinent surgical history.  (Not in a hospital admission)   SH: Social History   Tobacco Use   Smoking status: Never Smoker  Substance Use Topics   Alcohol use: Never    Frequency: Never   Drug use: Never    MEDS: Prior to Admission medications   Not on File    ALLERGY: No Known Allergies  Social  History   Tobacco Use   Smoking status: Never Smoker  Substance Use Topics   Alcohol use: Never    Frequency: Never     History reviewed. No pertinent family history.   ROS   Review of Systems  Constitutional: Negative for fever, malaise/fatigue and weight loss.  HENT: Negative.  Negative for hearing loss and tinnitus.   Eyes: Negative.  Negative for blurred vision, double vision and photophobia.  Respiratory: Negative.   Cardiovascular: Negative.   Gastrointestinal: Negative for nausea and vomiting.  Genitourinary: Negative.   Musculoskeletal: Negative.   Skin: Negative.   Neurological: Positive for dizziness (dizziness) and headaches. Negative for tingling, tremors, sensory change, speech change, focal weakness, seizures, loss of consciousness and weakness.    Exam   Vitals:   09/27/18 1200 09/27/18 1356  BP: (!) 154/79 (!) 150/79  Pulse:  (!) 55  Resp: 11 15  Temp:    SpO2:  99%   General appearance: WDWN, NAD GCS: 15 Eyes: No scleral injection Cardiovascular: Regular rate and rhythm without murmurs, rubs, gallops. No edema or variciosities. Distal pulses normal. Pulmonary: Effort normal, non-labored breathing Musculoskeletal:     Muscle tone upper extremities: Normal    Muscle tone lower extremities: Normal    Motor exam: Upper Extremities Deltoid Bicep Tricep Grip  Right 5/5 5/5 5/5 5/5  Left 5/5 5/5 5/5 5/5   Lower Extremity IP Quad PF DF EHL  Right 5/5 5/5 5/5 5/5 5/5  Left 5/5 5/5 5/5 5/5  5/5   Neurological Mental Status:    - Patient is awake, alert, oriented to person, place, month, year, and situation    - Patient is able to give a clear and coherent history.    - No signs of aphasia or neglect Cranial Nerves    - II: Visual Fields are full. PERRL    - III/IV/VI: EOMI without ptosis or diploplia.     - V: Facial sensation is grossly normal    - VII: Facial movement is symmetric.     - VIII: hearing is intact to voice    - X: Uvula elevates  symmetrically    - XI: Shoulder shrug is symmetric.    - XII: tongue is midline without atrophy or fasciculations.  Sensory: Sensation grossly intact to LT Deep Tendon Reflexes    - 2+ and symmetric in the biceps and patellae.  Plantars   - Toes are downgoing bilaterally.  Cerebellar    - FNF and HKS are intact bilaterally   Results - Imaging/Labs   Results for orders placed or performed during the hospital encounter of 09/27/18 (from the past 48 hour(s))  CBC with Differential     Status: Abnormal   Collection Time: 09/27/18 12:12 PM  Result Value Ref Range   WBC 10.7 (H) 4.0 - 10.5 K/uL   RBC 5.06 4.22 - 5.81 MIL/uL   Hemoglobin 14.5 13.0 - 17.0 g/dL   HCT 42.4 39.0 - 52.0 %   MCV 83.8 80.0 - 100.0 fL   MCH 28.7 26.0 - 34.0 pg   MCHC 34.2 30.0 - 36.0 g/dL   RDW 13.2 11.5 - 15.5 %   Platelets 173 150 - 400 K/uL   nRBC 0.0 0.0 - 0.2 %   Neutrophils Relative % 86 %   Neutro Abs 9.1 (H) 1.7 - 7.7 K/uL   Lymphocytes Relative 9 %   Lymphs Abs 1.0 0.7 - 4.0 K/uL   Monocytes Relative 5 %   Monocytes Absolute 0.6 0.1 - 1.0 K/uL   Eosinophils Relative 0 %   Eosinophils Absolute 0.0 0.0 - 0.5 K/uL   Basophils Relative 0 %   Basophils Absolute 0.0 0.0 - 0.1 K/uL   Immature Granulocytes 0 %   Abs Immature Granulocytes 0.04 0.00 - 0.07 K/uL    Comment: Performed at [facility], [address] [place], [place] [id number]  Basic metabolic panel     Status: Abnormal   Collection Time: 09/27/18 12:12 PM  Result Value Ref Range   Sodium 141 135 - 145 mmol/L   Potassium 4.3 3.5 - 5.1 mmol/L   Chloride 102 98 - 111 mmol/L   CO2 28 22 - 32 mmol/L   Glucose, Bld 108 (H) 70 - 99 mg/dL   BUN 11 6 - 20 mg/dL   Creatinine, Ser 1.28 (H) 0.61 - 1.24 mg/dL   Calcium 9.0 8.9 - 10.3 mg/dL   GFR calc non Af Amer >60 >60 mL/min   GFR calc Af Amer >60 >60 mL/min   Anion gap 11 5 - 15    Comment: Performed at [facility], [place] [address] [place], [place] [address]  C-reactive  protein     Status: None   Collection Time: 09/27/18 12:12 PM  Result Value Ref Range   CRP <0.8 <1.0 mg/dL    Comment: Performed at [facility] [address] [place], [place] [address]  Sedimentation rate     Status: None   Collection  Time: 09/27/18 12:12 PM  Result Value Ref Range   Sed Rate 3 0 - 16 mm/hr    Comment: Performed at [facility] [address], [place], [place] [address]    Ct Head Wo Contrast  Result Date: 09/27/2018 CLINICAL DATA:  Headache and dizziness EXAM: CT HEAD WITHOUT CONTRAST TECHNIQUE: Contiguous axial images were obtained from the base of the skull through the vertex without intravenous contrast. COMPARISON:  None. FINDINGS: Brain: The ventricles appear normal in size and configuration. There is extensive vasogenic edema throughout much of the left occipital and inferior to mid parietal lobe region. There is a somewhat cystic appearing area within this area of vasogenic edema measuring 3.2 x 2.8 cm. There is apparent edema throughout the superior left frontal and parietal lobes with relative of effacement of sulci in these areas compared to the normal-appearing right side. There is no midline shift. No subdural or epidural fluid collections. No acute hemorrhage evident. There is decreased attenuation in the left anterior inferior frontal lobe, a likely prior infarct. Vascular: No hyperdense vessel. No vascular calcifications are evident. Skull: Bony calvarium appears intact. Sinuses/Orbits: There is mucosal thickening in several ethmoid air cells. Other visualized paranasal sinuses are clear. Visualized orbits appear symmetric bilaterally. Other: Visualized mastoid air cells are clear. IMPRESSION: 1. Cystic appearing area in the left occipital lobe posteriorly with extensive vasogenic edema in this area. Suspect mass or possible abscess in this area. Advise pre and post-contrast brain MRI to further evaluate. 2. Apparent edema throughout the superior left  frontal and parietal lobes with loss of sulci. No well-defined mass in this area. Again, MR pre and postcontrast may be helpful for further assessment this region, in particular to assess for potential additional masslike lesions. 3. 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 4.  No acute hemorrhage evident.  No midline shift. 5.  Mucosal thickening noted in several ethmoid air cells. These results were called by telephone at the time of interpretation on 09/27/2018 at 1:39 pm to [facility], [facility] , who verbally acknowledged these results. Electronically Signed   By: Lowella Grip III M.D.   On: 09/27/2018 13:40   Impression/Plan   58 y.o. male with several month history of headaches, found to have left occipital cystic mass with extensive vasogenic edema. No personal history of cancer. He is neurologically intact. MRI brain w/wo contrast pending. Will await results. Ordered decadron 10mg  to be given now, then 4mg  q 6 hours.  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 There is extension to occipital horn of the left lateral ventricle with ependymal enhancement. Small satellite enhancing focus in inferior occipital lobe. There is extensive vasogenic edema.  Lesion ?GBM. This will need to be resected, tentatively Thursday. Will plan to admit to ICU under NS service for further treatment and management. - Obtain CT chest/abd/pelvis to r/o other primary cancer - CT head wo with brain lab protocol for surgical planning - Continue decadron

## 2018-09-27 NOTE — H&P (Signed)
Chief Complaint   Chief Complaint  Patient presents with   Headache    HPI   Consult requested by: [facility] Reason for consult: brain mass  HPI: Cody [name] is a 58 y.o. male with history of BPH who presented to ED with a severe headache. He started having headaches several months ago, which is abnormal for him. He went to his PCP and was rx steroid dose pack. While on steroids, he had complete resolution of headache. Once he completed the steroids, the headache gradually returned over the course of 1-2 weeks. Due to persistent pain, he followed back up with his PCP approx 3 weeks ago, and was rx a second round of steroids and oxycodone for break through pain. Again, while on steroids, HA completely resolved until about 1 week ago or so. Describes left sided occipitoparietal headache. Pain fluctuates in severity, although never interferes with ADLs. Associated with intermittent dizziness. No associated changes in vision, nausea/vomiting, weakness, N/T, gait instability. Because the headache was so severe, he decided to come to ED to be evaluated. He underwent head CT which revealed a left occipital cystic like mass with extensive vasogenic edema. NSY consultation requested.   He has no personal history of cancer. 2 Sisters have both had breast cancer. He is up to date on all routine health maintenance including colonoscopy. Not on blood thinning agents. Denies issues with memory, naming, etc.  Patient Active Problem List   Diagnosis Date Noted   Brain tumor ([place]) 09/27/2018    PMH: Past Medical History:  Diagnosis Date   Enlarged prostate     PSH: History reviewed. No pertinent surgical history.  (Not in a hospital admission)   SH: Social History   Tobacco Use   Smoking status: Never Smoker  Substance Use Topics   Alcohol use: Never    Frequency: Never   Drug use: Never    MEDS: Prior to Admission medications   Medication Sig Start Date End  Date Taking? Authorizing Provider  finasteride (PROSCAR) 5 MG tablet Take 5 mg by mouth daily.   Yes [provider]  predniSONE (DELTASONE) 20 MG tablet Take 20 mg by mouth daily with breakfast. 20 mg for 10 days. On day 8 of therapy   Yes [provider]  tamsulosin (FLOMAX) 0.4 MG CAPS capsule Take 0.4 mg by mouth daily.   Yes [provider]    ALLERGY: No Known Allergies  Social History   Tobacco Use   Smoking status: Never Smoker  Substance Use Topics   Alcohol use: Never    Frequency: Never     History reviewed. No pertinent family history.   ROS   ROS  Constitutional: Negative for fever, malaise/fatigue and weight loss.  HENT: Negative.  Negative for hearing loss and tinnitus.   Eyes: Negative.  Negative for blurred vision, double vision and photophobia.  Respiratory: Negative.   Cardiovascular: Negative.   Gastrointestinal: Negative for nausea and vomiting.  Genitourinary: Negative.   Musculoskeletal: Negative.   Skin: Negative.   Neurological: Positive for dizziness (dizziness) and headaches. Negative for tingling, tremors, sensory change, speech change, focal weakness, seizures, loss of consciousness and weakness.   Exam   Vitals:   09/27/18 1500 09/27/18 1833  BP: (!) 145/100 (!) 141/86  Pulse: 91 70  Resp: 12 10  Temp:    SpO2: 100% 100%   General appearance: WDWN, NAD GCS: 15 Eyes: No scleral injection Cardiovascular: Regular rate and rhythm without murmurs, rubs, gallops.  No edema or variciosities. Distal pulses normal. Pulmonary: Effort normal, non-labored breathing Musculoskeletal:     Muscle tone upper extremities: Normal    Muscle tone lower extremities: Normal    Motor exam: Upper Extremities Deltoid Bicep Tricep Grip  Right 5/5 5/5 5/5 5/5  Left 5/5 5/5 5/5 5/5   Lower Extremity IP Quad PF DF EHL  Right 5/5 5/5 5/5 5/5 5/5  Left 5/5 5/5 5/5 5/5 5/5   Neurological Mental Status:    - Patient is awake,  alert, oriented to person, place, month, year, and situation    - Patient is able to give a clear and coherent history.    - No signs of aphasia or neglect Cranial Nerves    - II: Visual Fields are full. PERRL    - III/IV/VI: EOMI without ptosis or diploplia.     - V: Facial sensation is grossly normal    - VII: Facial movement is symmetric.     - VIII: hearing is intact to voice    - X: Uvula elevates symmetrically    - XI: Shoulder shrug is symmetric.    - XII: tongue is midline without atrophy or fasciculations.  Sensory: Sensation grossly intact to LT Deep Tendon Reflexes    - 2+ and symmetric in the biceps and patellae.  Plantars   - Toes are downgoing bilaterally. Cerebellar    - FNF and HKS are intact bilaterally   Results - Imaging/Labs   Results for orders placed or performed during the hospital encounter of 09/27/18 (from the past 48 hour(s))  CBC with Differential     Status: Abnormal   Collection Time: 09/27/18 12:12 PM  Result Value Ref Range   WBC 10.7 (H) 4.0 - 10.5 K/uL   RBC 5.06 4.22 - 5.81 MIL/uL   Hemoglobin 14.5 13.0 - 17.0 g/dL   HCT 42.4 39.0 - 52.0 %   MCV 83.8 80.0 - 100.0 fL   MCH 28.7 26.0 - 34.0 pg   MCHC 34.2 30.0 - 36.0 g/dL   RDW 13.2 11.5 - 15.5 %   Platelets 173 150 - 400 K/uL   nRBC 0.0 0.0 - 0.2 %   Neutrophils Relative % 86 %   Neutro Abs 9.1 (H) 1.7 - 7.7 K/uL   Lymphocytes Relative 9 %   Lymphs Abs 1.0 0.7 - 4.0 K/uL   Monocytes Relative 5 %   Monocytes Absolute 0.6 0.1 - 1.0 K/uL   Eosinophils Relative 0 %   Eosinophils Absolute 0.0 0.0 - 0.5 K/uL   Basophils Relative 0 %   Basophils Absolute 0.0 0.0 - 0.1 K/uL   Immature Granulocytes 0 %   Abs Immature Granulocytes 0.04 0.00 - 0.07 K/uL    Comment: Performed at [facility], [address], [place], [place] [id number]  Basic metabolic panel     Status: Abnormal   Collection Time: 09/27/18 12:12 PM  Result Value Ref Range   Sodium 141 135 - 145 mmol/L   Potassium 4.3  3.5 - 5.1 mmol/L   Chloride 102 98 - 111 mmol/L   CO2 28 22 - 32 mmol/L   Glucose, Bld 108 (H) 70 - 99 mg/dL   BUN 11 6 - 20 mg/dL   Creatinine, Ser 1.28 (H) 0.61 - 1.24 mg/dL   Calcium 9.0 8.9 - 10.3 mg/dL   GFR calc non Af Amer >60 >60 mL/min   GFR calc Af Amer >60 >60 mL/min   Anion gap 11 5 -  15    Comment: Performed at [facility], [place] [address] [place], [place] [address]  C-reactive protein     Status: None   Collection Time: 09/27/18 12:12 PM  Result Value Ref Range   CRP <0.8 <1.0 mg/dL    Comment: Performed at [facility] [address], [place], [place] [address]  Sedimentation rate     Status: None   Collection Time: 09/27/18 12:12 PM  Result Value Ref Range   Sed Rate 3 0 - 16 mm/hr    Comment: Performed at [facility] [address] [place], [place] [address]    Ct Head Wo Contrast  Result Date: 09/27/2018 CLINICAL DATA:  Headache and dizziness EXAM: CT HEAD WITHOUT CONTRAST TECHNIQUE: Contiguous axial images were obtained from the base of the skull through the vertex without intravenous contrast. COMPARISON:  None. FINDINGS: Brain: The ventricles appear normal in size and configuration. There is extensive vasogenic edema throughout much of the left occipital and inferior to mid parietal lobe region. There is a somewhat cystic appearing area within this area of vasogenic edema measuring 3.2 x 2.8 cm. There is apparent edema throughout the superior left frontal and parietal lobes with relative of effacement of sulci in these areas compared to the normal-appearing right side. There is no midline shift. No subdural or epidural fluid collections. No acute hemorrhage evident. There is decreased attenuation in the left anterior inferior frontal lobe, a likely prior infarct. Vascular: No hyperdense vessel. No vascular calcifications are evident. Skull: Bony calvarium appears intact. Sinuses/Orbits: There is mucosal thickening in several ethmoid air cells.  Other visualized paranasal sinuses are clear. Visualized orbits appear symmetric bilaterally. Other: Visualized mastoid air cells are clear. IMPRESSION: 1. Cystic appearing area in the left occipital lobe posteriorly with extensive vasogenic edema in this area. Suspect mass or possible abscess in this area. Advise pre and post-contrast brain MRI to further evaluate. 2. Apparent edema throughout the superior left frontal and parietal lobes with loss of sulci. No well-defined mass in this area. Again, MR pre and postcontrast may be helpful for further assessment this region, in particular to assess for potential additional masslike lesions. 3. 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 4.  No acute hemorrhage evident.  No midline shift. 5.  Mucosal thickening noted in several ethmoid air cells. These results were called by telephone at the time of interpretation on 09/27/2018 at 1:39 pm to [facility] , who verbally acknowledged these results. Electronically Signed   By: Lowella Grip III M.D.   On: 09/27/2018 13:40   Mr Jeri Cos And Wo Contrast  Result Date: 09/27/2018 CLINICAL DATA:  Headache and dizziness. Abnormal head CT with left hemisphere mass. EXAM: MRI HEAD WITHOUT AND WITH CONTRAST TECHNIQUE: Multiplanar, multiecho pulse sequences of the brain and surrounding structures were obtained without and with intravenous contrast. CONTRAST:  10 cc Gadavist COMPARISON:  Head CT same day FINDINGS: Brain: There is a background pattern of brain atrophy with chronic encephalomalacia and volume loss in the frontal lobes likely related to distant head trauma. There is a necrotic mass lesion in the left occipital lobe measuring 6 x 3 x 4 cm in size. Central portion of the mass is necrotic but does not show restricted diffusion to suggest abscess. There is continuous enhancement around the periphery of the lesion. Lesion extends to the surface of the brain in the occipital region. Tumor  extends to and involves the  occipital horn of the left lateral ventricle with ependymal enhancement. There is regional vasogenic edema and mass effect. There is left-to-right midline shift of 2 mm. Tiny satellite focus of enhancement at the inferior left occipital region. No evidence of vascular ischemic disease. No hydrocephalus. No extra-axial fluid collection. Vascular: Major vessels at the base of the brain show flow. Skull and upper cervical spine: Negative Sinuses/Orbits: Clear/normal Other: None IMPRESSION: 6 x 3 x 4 cm mass in the left occipital lobe with extensive necrosis. Extension to the occipital horn of the left lateral ventricle with ependymal enhancement. Regional vasogenic edema. Small satellite enhancing focus in the inferior occipital lobe. Mass effect with left-to-right shift of 2 mm. 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 Bifrontal atrophy and gliosis likely secondary to a distant history of close head injury. Electronically Signed   By: Nelson Chimes M.D.   On: 09/27/2018 19:27   Impression/Plan   58 y.o. male with several month history of headaches, found to have left occipital cystic mass with extensive vasogenic edema. No personal history of cancer. He is neurologically intact. MRI brain w/wo contrast pending. Will await results. Ordered decadron 10mg  to be given now, then 4mg  q 6 hours.  MRI reveals 6x3x4cm left occipital mass with central necrosis. There is extension to occipital horn of the left lateral ventricle with ependymal enhancement. Small satellite enhancing focus in inferior occipital lobe. There is extensive vasogenic edema.  Lesion ?GBM. This will need to be resected, tentatively Thursday. Will plan to admit to ICU under NS service for further treatment and management. - Obtain CT chest/abd/pelvis to r/o other primary cancer - CT head wo with brain lab protocol for surgical planning - Continue decadron  Ferne Reus, PA-C [facility]

## 2018-09-27 NOTE — ED Provider Notes (Signed)
[facility] EMERGENCY DEPARTMENT Provider Note   CSN: [id number] Arrival date & time: 09/27/18  1749     History   Chief Complaint Chief Complaint  Patient presents with   Headache    HPI Cody [name] is a 58 y.o. male with history of enlarged prostate, otherwise healthy presenting today for headache and visual changes.  Patient reports that he has had headache for the past 4 weeks, pain primarily occipital throbbing severe constant.  Patient reports that approximately 3 weeks ago he presented to his primary care provider and received prednisone taper which temporarily relieved his headache, he reports that on the last day of his prednisone taper his headache returned.  He then endured this headache for approximately 3 days before returning to his primary care provider and receiving another prednisone taper.  Patient reports that he is now on the sixth day of his second prednisone taper however pain has remained constant since the end of the first taper.  At time of my evaluation patient reports a severe constant throbbing occipital headache that will occasionally radiate towards the right forehead, no clear aggravating factors and no alleviating factors, he is also attempted Tylenol without relief.  Associated symptoms include visual changes, blurred vision that he reports and primarily his right eye that has been present for the past 3 days, unchanged since onset.  Additionally he reports confusion for 3 days difficulty completing tasks and following directions.  Patient reports that prior to 4 weeks ago he never had problems with headaches and/or migraines and he was otherwise healthy.  He denies any fever/chills, neck pain/stiffness, difficulty speaking, numbness/weakness, tingling, chest pain/shortness of breath, cough, fall/injury, history of cancer, abdominal pain, nausea/vomiting, difficulty walking, balance problems, back pain or any additional  concerns.  Unable to access patient's primary care records as they are with [facility] physicians group.    HPI  Past Medical History:  Diagnosis Date   Enlarged prostate     Patient Active Problem List   Diagnosis Date Noted   Brain tumor ([place]) 09/27/2018    History reviewed. No pertinent surgical history.      Home Medications    Prior to Admission medications   Medication Sig Start Date End Date Taking? Authorizing Provider  finasteride (PROSCAR) 5 MG tablet Take 5 mg by mouth daily.   Yes [provider]  predniSONE (DELTASONE) 20 MG tablet Take 20 mg by mouth daily with breakfast. 20 mg for 10 days. On day 8 of therapy   Yes [provider]  tamsulosin (FLOMAX) 0.4 MG CAPS capsule Take 0.4 mg by mouth daily.   Yes [provider]    Family History History reviewed. No pertinent family history.  Social History Social History   Tobacco Use   Smoking status: Never Smoker  Substance Use Topics   Alcohol use: Never    Frequency: Never   Drug use: Never     Allergies   Patient has no known allergies.   Review of Systems Review of Systems Ten systems are reviewed and are negative for acute change except as noted in the HPI  Physical Exam Updated Vital Signs BP (!) 141/86    Pulse 70    Temp 98.7 F (37.1 C)    Resp 10    SpO2 100%   Physical Exam Constitutional:      General: He is not in acute distress.    Appearance: Normal appearance. He is well-developed. He is not ill-appearing or diaphoretic.  HENT:     Head: Normocephalic and atraumatic. No raccoon eyes or Battle's sign.     Jaw: There is normal jaw occlusion. No trismus.     Right Ear: External ear normal.     Left Ear: External ear normal.     Nose: Nose normal. No rhinorrhea.     Mouth/Throat:     Mouth: Mucous membranes are moist.     Pharynx: Oropharynx is clear.  Eyes:     General: Gaze aligned appropriately.     Extraocular Movements: Extraocular movements  intact.     Conjunctiva/sclera: Conjunctivae normal.     Pupils: Pupils are equal, round, and reactive to light. Pupils are equal.     Funduscopic exam:    Right eye: No hemorrhage. Red reflex present.        Left eye: No hemorrhage. Red reflex present.    Slit lamp exam:    Right eye: Anterior chamber quiet. No hyphema, hypopyon or photophobia.     Left eye: Anterior chamber quiet. No hyphema, hypopyon or photophobia.     Comments: Right sided visual defect, bilateral eyes.  Neck:     Musculoskeletal: Full passive range of motion without pain, normal range of motion and neck supple.     Trachea: Trachea and phonation normal. No tracheal deviation.     Meningeal: Brudzinski's sign absent.  Pulmonary:     Effort: Pulmonary effort is normal. No accessory muscle usage or respiratory distress.     Breath sounds: Normal breath sounds and air entry.  Abdominal:     General: There is no distension.     Palpations: Abdomen is soft.     Tenderness: There is no abdominal tenderness. There is no guarding or rebound.  Musculoskeletal: Normal range of motion.     Comments: No midline C/T/L spinal tenderness to palpation, no paraspinal muscle tenderness, no deformity, crepitus, or step-off noted. No sign of injury to the neck or back.  Skin:    General: Skin is warm and dry.  Neurological:     Mental Status: He is alert.     GCS: GCS eye subscore is 4. GCS verbal subscore is 5. GCS motor subscore is 6.     Comments: Mental Status: Alert, oriented, thought content appropriate, able to give a coherent history. Speech fluent without evidence of aphasia. Able to follow 2 step commands without difficulty. Cranial Nerves:  II: Bilateral eyes with right-sided visual field defect, pupils equal, round, reactive to light  III,IV, VI: ptosis not present, extra-ocular motions intact bilaterally V,VII: smile symmetric, eyebrows raise symmetric, facial light touch sensation equal VIII: hearing grossly  normal to voice X: uvula elevates symmetrically XI: bilateral shoulder shrug symmetric and strong XII: midline tongue extension without fassiculations Motor: Normal tone. 5/5 strength in upper and lower extremities bilaterally including strong and equal grip strength and dorsiflexion/plantar flexion Sensory: Sensation intact to light touch in all extremities.Negative Romberg.  Deep Tendon Reflexes: 2+ and symmetric in patella Cerebellar: normal finger-to-nose with bilateral upper extremities. Normal heel-to -shin balance bilaterally of the lower extremity. No pronator drift.  Gait: normal gait and balance CV: distal pulses palpable throughout  Psychiatric:        Behavior: Behavior normal.      ED Treatments / Results  Labs (all labs ordered are listed, but only abnormal results are displayed) Labs Reviewed  CBC WITH DIFFERENTIAL/PLATELET - Abnormal; Notable for the following components:      Result Value   WBC 10.7 (*)  Neutro Abs 9.1 (*)    All other components within normal limits  BASIC METABOLIC PANEL - Abnormal; Notable for the following components:   Glucose, Bld 108 (*)    Creatinine, Ser 1.28 (*)    All other components within normal limits  SARS CORONAVIRUS 2 (TAT 6-12 HRS)  C-REACTIVE PROTEIN  SEDIMENTATION RATE    EKG None  Radiology Ct Head Wo Contrast  Result Date: 09/27/2018 CLINICAL DATA:  Headache and dizziness EXAM: CT HEAD WITHOUT CONTRAST TECHNIQUE: Contiguous axial images were obtained from the base of the skull through the vertex without intravenous contrast. COMPARISON:  None. FINDINGS: Brain: The ventricles appear normal in size and configuration. There is extensive vasogenic edema throughout much of the left occipital and inferior to mid parietal lobe region. There is a somewhat cystic appearing area within this area of vasogenic edema measuring 3.2 x 2.8 cm. There is apparent edema throughout the superior left frontal and parietal lobes with  relative of effacement of sulci in these areas compared to the normal-appearing right side. There is no midline shift. No subdural or epidural fluid collections. No acute hemorrhage evident. There is decreased attenuation in the left anterior inferior frontal lobe, a likely prior infarct. Vascular: No hyperdense vessel. No vascular calcifications are evident. Skull: Bony calvarium appears intact. Sinuses/Orbits: There is mucosal thickening in several ethmoid air cells. Other visualized paranasal sinuses are clear. Visualized orbits appear symmetric bilaterally. Other: Visualized mastoid air cells are clear. IMPRESSION: 1. Cystic appearing area in the left occipital lobe posteriorly with extensive vasogenic edema in this area. Suspect mass or possible abscess in this area. Advise pre and post-contrast brain MRI to further evaluate. 2. Apparent edema throughout the superior left frontal and parietal lobes with loss of sulci. No well-defined mass in this area. Again, MR pre and postcontrast may be helpful for further assessment this region, in particular to assess for potential additional masslike lesions. 3. 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 4.  No acute hemorrhage evident.  No midline shift. 5.  Mucosal thickening noted in several ethmoid air cells. These results were called by telephone at the time of interpretation on 09/27/2018 at 1:39 pm to [facility] , who verbally acknowledged these results. Electronically Signed   By: Lowella Grip III M.D.   On: 09/27/2018 13:40   Mr Jeri Cos And Wo Contrast  Result Date: 09/27/2018 CLINICAL DATA:  Headache and dizziness. Abnormal head CT with left hemisphere mass. EXAM: MRI HEAD WITHOUT AND WITH CONTRAST TECHNIQUE: Multiplanar, multiecho pulse sequences of the brain and surrounding structures were obtained without and with intravenous contrast. CONTRAST:  10 cc Gadavist COMPARISON:  Head CT same day FINDINGS: Brain: There is a  background pattern of brain atrophy with chronic encephalomalacia and volume loss in the frontal lobes likely related to distant head trauma. There is a necrotic mass lesion in the left occipital lobe measuring 6 x 3 x 4 cm in size. Central portion of the mass is necrotic but does not show restricted diffusion to suggest abscess. There is continuous enhancement around the periphery of the lesion. Lesion extends to the surface of the brain in the occipital region. Tumor extends to and involves the occipital horn of the left lateral ventricle with ependymal enhancement. There is regional vasogenic edema and mass effect. There is left-to-right midline shift of 2 mm. Tiny satellite focus of enhancement at the inferior left occipital region. No evidence of vascular ischemic disease. No hydrocephalus. No  extra-axial fluid collection. Vascular: Major vessels at the base of the brain show flow. Skull and upper cervical spine: Negative Sinuses/Orbits: Clear/normal Other: None IMPRESSION: 6 x 3 x 4 cm mass in the left occipital lobe with extensive necrosis. Extension to the occipital horn of the left lateral ventricle with ependymal enhancement. Regional vasogenic edema. Small satellite enhancing focus in the inferior occipital lobe. Mass effect with left-to-right shift of 2 mm. 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 Bifrontal atrophy and gliosis likely secondary to a distant history of close head injury. Electronically Signed   By: Nelson Chimes M.D.   On: 09/27/2018 19:27    Procedures .Critical Care Performed by: Deliah Boston, PA-C Authorized by: Deliah Boston, PA-C   Critical care provider statement:    Critical care time (minutes):  45   Critical care was necessary to treat or prevent imminent or life-threatening deterioration of the following conditions:  CNS failure or compromise (New intracranial lesion with neuro deficit)   Critical care was time spent personally by me on the following  activities:  Discussions with consultants, evaluation of patient's response to treatment, examination of patient, ordering and performing treatments and interventions, ordering and review of laboratory studies, ordering and review of radiographic studies, pulse oximetry, re-evaluation of patient's condition, obtaining history from patient or surrogate and review of old charts   (including critical care time)  Medications Ordered in ED Medications  dexamethasone (DECADRON) injection 10 mg (10 mg Intravenous Not Given 09/27/18 1537)  dexamethasone (DECADRON) injection 4 mg (has no administration in time range)  metoCLOPramide (REGLAN) injection 10 mg (10 mg Intravenous Given 09/27/18 1232)  diphenhydrAMINE (BENADRYL) injection 12.5 mg (12.5 mg Intravenous Given 09/27/18 1232)  LORazepam (ATIVAN) injection 1 mg (1 mg Intravenous Given 09/27/18 1721)  morphine 2 MG/ML injection 2 mg (2 mg Intravenous Given 09/27/18 1722)  gadobutrol (GADAVIST) 1 MMOL/ML injection 10 mL (10 mLs Intravenous Contrast Given 09/27/18 1800)     Initial Impression / Assessment and Plan / ED Course  I have reviewed the triage vital signs and the nursing notes.  Pertinent labs & imaging results that were available during my care of the patient were reviewed by me and considered in my medical decision making (see chart for details).    New headache and 58 year old male with concerning finding of right-sided visual field defect will need imaging and blood work.  Visual defect present for at least 3 days per patient, is not within stroke window.  No other neuro deficits on my examination.  Will add CRP and ESR for evaluation of possible arteritis as etiology of visual symptoms. - CBC with leukocytosis of 10.7 with left shift, patient without infectious-like symptoms, suspicion for infection at this time BMP with creatinine of 1.28, nonacute ESR within normal limits CRP within normal limits - CT Head:  IMPRESSION:  1.  Cystic appearing area in the left occipital lobe posteriorly with  extensive vasogenic edema in this area. Suspect mass or possible  abscess in this area. Advise pre and post-contrast brain MRI to  further evaluate.    2. Apparent edema throughout the superior left frontal and parietal  lobes with loss of sulci. No well-defined mass in this area. Again,  MR pre and postcontrast may be helpful for further assessment this  region, in particular to assess for potential additional masslike  lesions.    3. Apparent prior infarct in the anterior inferior left frontal  lobe.    4. No acute hemorrhage evident. No  midline shift.    5. Mucosal thickening noted in several ethmoid air cells.  ----------------------------- MRI brain with and without contrast ordered.  Patient reassessed he is resting comfortably and in no acute distress.  Reports improvement of his headache following Reglan/Benadryl today.  He has been advised of CT scan findings and states understanding, he is agreeable to MRI and further work-up at this time.  He has no questions at this time.  Consult placed to neurosurgery. - Discussed case with on-call neurosurgery will see patient in ER. - Patient reassessed resting comfortably no acute distress continues to await MRIs.  Will give additional pain medication as he reports headache is beginning to return.  Patient seen and evaluated by neurosurgery team. - MRI Brain:  IMPRESSION:  6 x 3 x 4 cm mass in the left occipital lobe with extensive  necrosis. Extension to the occipital horn of the left lateral  ventricle with ependymal enhancement. Regional vasogenic edema.  Small satellite enhancing focus in the inferior occipital lobe. Mass  effect with left-to-right shift of 2 mm. Likely diagnosis is  glioblastoma multiforme.    Bifrontal atrophy and gliosis likely secondary to a distant history  of close head injury.  - Patient has been admitted to neurosurgery  service for further evaluation and management.  Patient was seen and evaluated by Dr. Eulis Foster during this visit.  Note: Portions of this report may have been transcribed using voice recognition software. Every effort was made to ensure accuracy; however, inadvertent computerized transcription errors may still be present. Final Clinical Impressions(s) / ED Diagnoses   Final diagnoses:  Brain tumor [facility])    ED Discharge Orders    None       Gari Crown 09/27/18 1943    Daleen Bo, MD 09/29/18 1757

## 2018-09-27 NOTE — ED Provider Notes (Signed)
  Face-to-face evaluation   History: Complains of intermittent headache for 1 month, despite being treated with prednisone, x2 by his PCP.  He is also noticed some "blurred vision," which he describes as loss of vision on the right eye, on the right side.  Loss of vision has been present, constantly for 2 or 3 days.  2 days ago his headache became persistent, without relief.  He is ambulatory and came here by private vehicle.  He works as a Engineer, manufacturing systems.  He denies head trauma.  Physical exam: Alert, calm and cooperative.  No facial asymmetry.  No dysarthria or aphasia.  Right knee with normal vessels and disc bilaterally.  No apparent retinal abnormality or vitreous lesions.  Normal strength arms and legs bilaterally.  Medical screening examination/treatment/procedure(s) were conducted as a shared visit with non-physician practitioner(s) and myself.  I personally evaluated the patient during the encounter    Daleen Bo, MD 09/29/18 1757

## 2018-09-28 LAB — SURGICAL PCR SCREEN
MRSA, PCR: NEGATIVE
Staphylococcus aureus: NEGATIVE

## 2018-09-28 LAB — SARS CORONAVIRUS 2 (TAT 6-24 HRS): SARS Coronavirus 2: NEGATIVE

## 2018-09-28 LAB — MRSA PCR SCREENING: MRSA by PCR: NEGATIVE

## 2018-09-28 LAB — HIV ANTIBODY (ROUTINE TESTING W REFLEX): HIV Screen 4th Generation wRfx: NONREACTIVE

## 2018-09-28 MED ORDER — CHLORHEXIDINE GLUCONATE CLOTH 2 % EX PADS: 6.0000 | MEDICATED_PAD | Freq: Every day | CUTANEOUS | Status: DC

## 2018-09-28 MED ORDER — CEFAZOLIN SODIUM-DEXTROSE 2-4 GM/100ML-% IV SOLN
2.0000 g | INTRAVENOUS | Status: AC
  Administered 2018-09-29: 2 g via INTRAVENOUS
  Filled 2018-09-28: qty 100

## 2018-09-28 MED ORDER — MUPIROCIN 2 % EX OINT
1.0000 "application " | TOPICAL_OINTMENT | Freq: Two times a day (BID) | CUTANEOUS | Status: DC
  Administered 2018-09-29: 1 via NASAL
  Filled 2018-09-28: qty 22

## 2018-09-28 NOTE — Progress Notes (Signed)
Informed consent signed by patient after Kathyrn Sheriff explained the risks/benefits of the surgery.  Pt's wife was listening via speaker phone.  Neither the pt nor his wife had any questions at the time.

## 2018-09-28 NOTE — ED Notes (Signed)
ED TO INPATIENT HANDOFF REPORT  ED Nurse Name and Phone #: Annie Main [phone]  S Name/Age/Gender Cody [name] 58 y.o. male Room/Bed: 046C/046C  Code Status   Code Status: Full Code  Home/SNF/Other Home Patient oriented to: self, place, time and situation Is this baseline? Yes   Triage Complete: Triage complete  Chief Complaint ha  Triage Note Pt reports severe headaches for several weeks. Has been seen by pcp and given prednisone, return of headache yesterday. Reports blurred vision with the headache but denies any n/v, fever or other neuro deficits.   Allergies No Known Allergies  Level of Care/Admitting Diagnosis ED Disposition    ED Disposition Condition [facility] Area: [place] [100100]  Level of Care: ICU [6]  Covid Evaluation: Person Under Investigation (PUI)  Diagnosis: Brain tumor [facility]) [id number]  Admitting Physician: [name] [id number]  Attending Physician: [name] [id number]  Estimated length of stay: 5 - 7 days  Certification:: I certify this patient will need inpatient services for at least 2 midnights  Bed request comments: 4N  PT Class (Do Not Modify): Inpatient [101]  PT Acc Code (Do Not Modify): Private [1]       B Medical/Surgery History Past Medical History:  Diagnosis Date  . Enlarged prostate    History reviewed. No pertinent surgical history.   A IV Location/Drains/Wounds Patient Lines/Drains/Airways Status   Active Line/Drains/Airways    Name:   Placement date:   Placement time:   Site:   Days:   Peripheral IV 09/27/18 Right Antecubital   09/27/18    2132    Antecubital   1          Intake/Output Last 24 hours No intake or output data in the 24 hours ending 09/28/18 0021  Labs/Imaging Results for orders placed or performed during the hospital encounter of 09/27/18 (from the past 48 hour(s))  CBC with Differential     Status: Abnormal   Collection Time: 09/27/18 12:12 PM   Result Value Ref Range   WBC 10.7 (H) 4.0 - 10.5 K/uL   RBC 5.06 4.22 - 5.81 MIL/uL   Hemoglobin 14.5 13.0 - 17.0 g/dL   HCT 42.4 39.0 - 52.0 %   MCV 83.8 80.0 - 100.0 fL   MCH 28.7 26.0 - 34.0 pg   MCHC 34.2 30.0 - 36.0 g/dL   RDW 13.2 11.5 - 15.5 %   Platelets 173 150 - 400 K/uL   nRBC 0.0 0.0 - 0.2 %   Neutrophils Relative % 86 %   Neutro Abs 9.1 (H) 1.7 - 7.7 K/uL   Lymphocytes Relative 9 %   Lymphs Abs 1.0 0.7 - 4.0 K/uL   Monocytes Relative 5 %   Monocytes Absolute 0.6 0.1 - 1.0 K/uL   Eosinophils Relative 0 %   Eosinophils Absolute 0.0 0.0 - 0.5 K/uL   Basophils Relative 0 %   Basophils Absolute 0.0 0.0 - 0.1 K/uL   Immature Granulocytes 0 %   Abs Immature Granulocytes 0.04 0.00 - 0.07 K/uL    Comment: Performed at [facility], [address] [place], [place] [id number]  Basic metabolic panel     Status: Abnormal   Collection Time: 09/27/18 12:12 PM  Result Value Ref Range   Sodium 141 135 - 145 mmol/L   Potassium 4.3 3.5 - 5.1 mmol/L   Chloride 102 98 - 111 mmol/L   CO2 28 22 - 32 mmol/L   Glucose, Bld 108 (  H) 70 - 99 mg/dL   BUN 11 6 - 20 mg/dL   Creatinine, Ser 1.28 (H) 0.61 - 1.24 mg/dL   Calcium 9.0 8.9 - 10.3 mg/dL   GFR calc non Af Amer >60 >60 mL/min   GFR calc Af Amer >60 >60 mL/min   Anion gap 11 5 - 15    Comment: Performed at [facility] [address], [place], [place] [address]  C-reactive protein     Status: None   Collection Time: 09/27/18 12:12 PM  Result Value Ref Range   CRP <0.8 <1.0 mg/dL    Comment: Performed at [facility] [address] [place], [place] [address]  Sedimentation rate     Status: None   Collection Time: 09/27/18 12:12 PM  Result Value Ref Range   Sed Rate 3 0 - 16 mm/hr    Comment: Performed at [facility] [address], [place], [place] [address]  APTT     Status: None   Collection Time: 09/27/18  8:39 PM  Result Value Ref Range   aPTT 33 24 - 36 seconds    Comment: Performed at  [facility] [address], [place], [place] [address]  Protime-INR     Status: Abnormal   Collection Time: 09/27/18  8:39 PM  Result Value Ref Range   Prothrombin Time 15.4 (H) 11.4 - 15.2 seconds   INR 1.2 0.8 - 1.2    Comment: (NOTE) INR goal varies based on device and disease states. Performed at [facility], [place] [address] [place],  [address]    Ct Head Wo Contrast  Result Date: 09/27/2018 CLINICAL DATA: Headache.  Neoplasm, surgical planning EXAM: CT HEAD WITHOUT CONTRAST TECHNIQUE: Contiguous axial images were obtained from the base of the skull through the vertex without intravenous contrast. COMPARISON:  Earlier today, 09/27/2018 FINDINGS: Brain: Again noted is the large necrotic mass in the left occipital lobe, unchanged since prior study. No hemorrhage. Extensive surrounding vasogenic edema. Mass effect on the occipital horn of the left lateral ventricle. No acute infarct or hydrocephalus. No hemorrhage. No acute infarct. No hydrocephalus. Vascular: No hyperdense vessel or unexpected calcification. Skull: No acute calvarial abnormality. Sinuses/Orbits: Visualized paranasal sinuses and mastoids clear. Orbital soft tissues unremarkable. Other: None IMPRESSION: Stable appearance of the necrotic mass in the left occipital lobe with extensive surrounding vasogenic edema. Electronically Signed   By: Rolm Baptise M.D.   On: 09/27/2018 21:50   Ct Head Wo Contrast  Result Date: 09/27/2018 CLINICAL DATA:  Headache and dizziness EXAM: CT HEAD WITHOUT CONTRAST TECHNIQUE: Contiguous axial images were obtained from the base of the skull through the vertex without intravenous contrast. COMPARISON:  None. FINDINGS: Brain: The ventricles appear normal in size and configuration. There is extensive vasogenic edema throughout much of the left occipital and inferior to mid parietal lobe region. There is a somewhat cystic appearing area within this area of vasogenic edema measuring 3.2  x 2.8 cm. There is apparent edema throughout the superior left frontal and parietal lobes with relative of effacement of sulci in these areas compared to the normal-appearing right side. There is no midline shift. No subdural or epidural fluid collections. No acute hemorrhage evident. There is decreased attenuation in the left anterior inferior frontal lobe, a likely prior infarct. Vascular: No hyperdense vessel. No vascular calcifications are evident. Skull: Bony calvarium appears intact. Sinuses/Orbits: There is mucosal thickening in several ethmoid air cells. Other visualized paranasal  sinuses are clear. Visualized orbits appear symmetric bilaterally. Other: Visualized mastoid air cells are clear. IMPRESSION: 1. Cystic appearing area in the left occipital lobe posteriorly with extensive vasogenic edema in this area. Suspect mass or possible abscess in this area. Advise pre and post-contrast brain MRI to further evaluate. 2. Apparent edema throughout the superior left frontal and parietal lobes with loss of sulci. No well-defined mass in this area. Again, MR pre and postcontrast may be helpful for further assessment this region, in particular to assess for potential additional masslike lesions. 3. 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 4.  No acute hemorrhage evident.  No midline shift. 5.  Mucosal thickening noted in several ethmoid air cells. These results were called by telephone at the time of interpretation on 09/27/2018 at 1:39 pm to [facility] , who verbally acknowledged these results. Electronically Signed   By: Lowella Grip III M.D.   On: 09/27/2018 13:40   Ct Chest W Contrast  Result Date: 09/27/2018 CLINICAL DATA:  Headache.  Left occipital mass. EXAM: CT CHEST, ABDOMEN, AND PELVIS WITH CONTRAST TECHNIQUE: Multidetector CT imaging of the chest, abdomen and pelvis was performed following the standard protocol during bolus administration of intravenous  contrast. CONTRAST:  111mL OMNIPAQUE IOHEXOL 300 MG/ML  SOLN COMPARISON:  None. FINDINGS: CT CHEST FINDINGS Cardiovascular: Normal heart size.  No abnormal vascular finding. Mediastinum/Nodes: No mass or adenopathy. Lungs/Pleura: No pleural effusion. 3 mm subpleural nodule right upper lobe image 13. 2 mm subpleural nodule right upper lobe image 26. No significant pulmonary finding. Musculoskeletal: Normal CT ABDOMEN PELVIS FINDINGS Hepatobiliary: Normal Pancreas: Normal Spleen: Normal Adrenals/Urinary Tract: Adrenal glands are normal. Kidneys are normal. Bladder is normal. Stomach/Bowel: No abnormal bowel finding. Vascular/Lymphatic: Normal.  No atherosclerotic disease. Reproductive: Normal Other: No free fluid or air. Musculoskeletal: Normal IMPRESSION: Normal CT scan of the chest abdomen and pelvis. Electronically Signed   By: Nelson Chimes M.D.   On: 09/27/2018 21:51   Mr Jeri Cos And Wo Contrast  Result Date: 09/27/2018 CLINICAL DATA:  Headache and dizziness. Abnormal head CT with left hemisphere mass. EXAM: MRI HEAD WITHOUT AND WITH CONTRAST TECHNIQUE: Multiplanar, multiecho pulse sequences of the brain and surrounding structures were obtained without and with intravenous contrast. CONTRAST:  10 cc Gadavist COMPARISON:  Head CT same day FINDINGS: Brain: There is a background pattern of brain atrophy with chronic encephalomalacia and volume loss in the frontal lobes likely related to distant head trauma. There is a necrotic mass lesion in the left occipital lobe measuring 6 x 3 x 4 cm in size. Central portion of the mass is necrotic but does not show restricted diffusion to suggest abscess. There is continuous enhancement around the periphery of the lesion. Lesion extends to the surface of the brain in the occipital region. Tumor extends to and involves the occipital horn of the left lateral ventricle with ependymal enhancement. There is regional vasogenic edema and mass effect. There is left-to-right midline  shift of 2 mm. Tiny satellite focus of enhancement at the inferior left occipital region. No evidence of vascular ischemic disease. No hydrocephalus. No extra-axial fluid collection. Vascular: Major vessels at the base of the brain show flow. Skull and upper cervical spine: Negative Sinuses/Orbits: Clear/normal Other: None IMPRESSION: 6 x 3 x 4 cm mass in the left occipital lobe with extensive necrosis. Extension to the occipital horn of the left lateral ventricle with ependymal enhancement. Regional vasogenic edema. Small satellite enhancing focus in the inferior  occipital lobe. Mass effect with left-to-right shift of 2 mm. 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 Bifrontal atrophy and gliosis likely secondary to a distant history of close head injury. Electronically Signed   By: Nelson Chimes M.D.   On: 09/27/2018 19:27   Ct Abdomen Pelvis W Contrast  Result Date: 09/27/2018 CLINICAL DATA:  Headache.  Left occipital mass. EXAM: CT CHEST, ABDOMEN, AND PELVIS WITH CONTRAST TECHNIQUE: Multidetector CT imaging of the chest, abdomen and pelvis was performed following the standard protocol during bolus administration of intravenous contrast. CONTRAST:  176mL OMNIPAQUE IOHEXOL 300 MG/ML  SOLN COMPARISON:  None. FINDINGS: CT CHEST FINDINGS Cardiovascular: Normal heart size.  No abnormal vascular finding. Mediastinum/Nodes: No mass or adenopathy. Lungs/Pleura: No pleural effusion. 3 mm subpleural nodule right upper lobe image 13. 2 mm subpleural nodule right upper lobe image 26. No significant pulmonary finding. Musculoskeletal: Normal CT ABDOMEN PELVIS FINDINGS Hepatobiliary: Normal Pancreas: Normal Spleen: Normal Adrenals/Urinary Tract: Adrenal glands are normal. Kidneys are normal. Bladder is normal. Stomach/Bowel: No abnormal bowel finding. Vascular/Lymphatic: Normal.  No atherosclerotic disease. Reproductive: Normal Other: No free fluid or air. Musculoskeletal: Normal IMPRESSION: Normal CT scan of the chest  abdomen and pelvis. Electronically Signed   By: Nelson Chimes M.D.   On: 09/27/2018 21:51    Pending Labs Unresulted Labs (From admission, onward)    Start     Ordered   09/27/18 2133  SARS CORONAVIRUS 2 (TAT 6-12 HRS) Nasal Swab Aptima Multi Swab  (Asymptomatic/Tier 2 Patients Labs)  Once,   STAT    Question Answer Comment  Is this test for diagnosis or screening Screening   Symptomatic for COVID-19 as defined by CDC No   Hospitalized for COVID-19 No   Admitted to ICU for COVID-19 No   Previously tested for COVID-19 No   Resident in a congregate (group) care setting No   Employed in healthcare setting No      09/27/18 2133   09/27/18 2020  HIV antibody (Routine Testing)  Once,   STAT     09/27/18 2021          Vitals/Pain Today's Vitals   09/27/18 1445 09/27/18 1500 09/27/18 1833 09/27/18 1845  BP: (!) 149/73 (!) 145/100 (!) 141/86   Pulse: 77 91 70   Resp: 16 12 10    Temp:      TempSrc:      SpO2: 100% 100% 100%   PainSc:    6     Isolation Precautions No active isolations  Medications Medications  dexamethasone (DECADRON) injection 10 mg (10 mg Intravenous Not Given 09/27/18 1537)  dexamethasone (DECADRON) injection 4 mg (4 mg Intravenous Given 09/27/18 2153)  finasteride (PROSCAR) tablet 5 mg (has no administration in time range)  tamsulosin (FLOMAX) capsule 0.4 mg (0.4 mg Oral Given 09/27/18 2113)  ondansetron (ZOFRAN) tablet 4 mg (has no administration in time range)    Or  ondansetron (ZOFRAN) injection 4 mg (has no administration in time range)  sodium phosphate (FLEET) 7-19 GM/118ML enema 1 enema (has no administration in time range)  bisacodyl (DULCOLAX) EC tablet 5 mg (has no administration in time range)  senna-docusate (Senokot-S) tablet 1 tablet (has no administration in time range)  docusate sodium (COLACE) capsule 100 mg (has no administration in time range)  zolpidem (AMBIEN) tablet 5 mg (has no administration in time range)  HYDROcodone-acetaminophen  (NORCO/VICODIN) 5-325 MG per tablet 1-2 tablet (has no administration in time range)  acetaminophen (TYLENOL) tablet 650 mg (has no administration in  time range)    Or  acetaminophen (TYLENOL) suppository 650 mg (has no administration in time range)  0.9 %  sodium chloride infusion ( Intravenous New Bag/Given 09/27/18 2152)  sodium chloride flush (NS) 0.9 % injection 3 mL (3 mLs Intravenous Not Given 09/27/18 2208)  sodium chloride flush (NS) 0.9 % injection 3 mL (has no administration in time range)  0.9 %  sodium chloride infusion (has no administration in time range)  metoCLOPramide (REGLAN) injection 10 mg (10 mg Intravenous Given 09/27/18 1232)  diphenhydrAMINE (BENADRYL) injection 12.5 mg (12.5 mg Intravenous Given 09/27/18 1232)  LORazepam (ATIVAN) injection 1 mg (1 mg Intravenous Given 09/27/18 1721)  morphine 2 MG/ML injection 2 mg (2 mg Intravenous Given 09/27/18 1722)  gadobutrol (GADAVIST) 1 MMOL/ML injection 10 mL (10 mLs Intravenous Contrast Given 09/27/18 1800)  iohexol (OMNIPAQUE) 300 MG/ML solution 100 mL (100 mLs Intravenous Contrast Given 09/27/18 2142)    Mobility walks Low fall risk   Focused Assessments Neuro Assessment Handoff:  Swallow screen pass? Deferred Cardiac Rhythm: Normal sinus rhythm NIH Stroke Scale ( + Modified Stroke Scale Criteria)  LOC Questions (1b. )   +: Answers both questions correctly LOC Commands (1c. )   + : Performs both tasks correctly Best Gaze (2. )  +: Normal Visual (3. )  +: No visual loss Motor Arm, Left (5a. )   +: No drift Motor Arm, Right (5b. )   +: No drift Motor Leg, Left (6a. )   +: No drift Motor Leg, Right (6b. )   +: No drift Sensory (8. )   +: Normal, no sensory loss Best Language (9. )   +: No aphasia Extinction/Inattention (11.)   +: No Abnormality Modified SS Total  +: 0     Neuro Assessment:   Neuro Checks:      Last Documented NIHSS Modified Score: 0 (09/27/18 1156) Has TPA been given? No If patient is a Neuro  Trauma and patient is going to OR before floor call report to [facility] nurse: [phone] or [phone]     R Recommendations: See Admitting Provider Note  Report given to:   Additional Notes:

## 2018-09-29 ENCOUNTER — Inpatient Hospital Stay (HOSPITAL_COMMUNITY): Payer: BC Managed Care – PPO | Admitting: Certified Registered Nurse Anesthetist

## 2018-09-29 ENCOUNTER — Encounter (HOSPITAL_COMMUNITY)
Admission: EM | Disposition: A | Discharge status: Home / Self Care | Payer: Self-pay | Source: Home / Self Care | Attending: Neurosurgery

## 2018-09-29 HISTORY — PX: CRANIOTOMY: SHX93

## 2018-09-29 HISTORY — PX: APPLICATION OF CRANIAL NAVIGATION: SHX6578

## 2018-09-29 LAB — ABO/RH: ABO/RH(D): O POS

## 2018-09-29 LAB — TYPE AND SCREEN
ABO/RH(D): O POS
Antibody Screen: NEGATIVE

## 2018-09-29 SURGERY — CRANIOTOMY TUMOR EXCISION
Anesthesia: General | Site: Head | Laterality: Left

## 2018-09-29 MED ORDER — CEFAZOLIN SODIUM-DEXTROSE 2-4 GM/100ML-% IV SOLN: 2.0000 g | Freq: Three times a day (TID) | INTRAVENOUS | Status: DC

## 2018-09-29 MED ORDER — ONDANSETRON HCL 4 MG/2ML IJ SOLN: 4.0000 mg | INTRAMUSCULAR | Status: DC | PRN

## 2018-09-29 MED ORDER — BISACODYL 5 MG PO TBEC: 5.0000 mg | DELAYED_RELEASE_TABLET | Freq: Every day | ORAL | Status: DC | PRN

## 2018-09-29 MED ORDER — ACETAMINOPHEN 500 MG PO TABS: 1000.0000 mg | ORAL_TABLET | Freq: Once | ORAL | Status: DC | PRN

## 2018-09-29 MED ORDER — HEMOSTATIC AGENTS (NO CHARGE) OPTIME
TOPICAL | Status: DC | PRN
  Administered 2018-09-29: 1 via TOPICAL

## 2018-09-29 MED ORDER — BACITRACIN ZINC 500 UNIT/GM EX OINT
TOPICAL_OINTMENT | CUTANEOUS | Status: AC
  Filled 2018-09-29: qty 28.35

## 2018-09-29 MED ORDER — SENNA 8.6 MG PO TABS
1.0000 | ORAL_TABLET | Freq: Two times a day (BID) | ORAL | Status: DC
  Administered 2018-10-01: 8.6 mg via ORAL
  Filled 2018-09-29 (×2): qty 1

## 2018-09-29 MED ORDER — LIDOCAINE-EPINEPHRINE 1 %-1:100000 IJ SOLN
INTRAMUSCULAR | Status: DC | PRN
  Administered 2018-09-29: 5 mL

## 2018-09-29 MED ORDER — SODIUM CHLORIDE 0.9 % IV SOLN
INTRAVENOUS | Status: DC | PRN
  Administered 2018-09-29: 20 ug/min via INTRAVENOUS

## 2018-09-29 MED ORDER — THROMBIN 20000 UNITS EX SOLR
CUTANEOUS | Status: AC
  Filled 2018-09-29: qty 20000

## 2018-09-29 MED ORDER — SODIUM CHLORIDE 0.9 % IV SOLN
0.0500 ug/kg/min | INTRAVENOUS | Status: AC
  Administered 2018-09-29: 17:00:00 .1 ug/kg/min via INTRAVENOUS
  Filled 2018-09-29: qty 4000

## 2018-09-29 MED ORDER — ACETAMINOPHEN 160 MG/5ML PO SOLN: 1000.0000 mg | Freq: Once | ORAL | Status: DC | PRN

## 2018-09-29 MED ORDER — FENTANYL CITRATE (PF) 100 MCG/2ML IJ SOLN: 25.0000 ug | INTRAMUSCULAR | Status: DC | PRN

## 2018-09-29 MED ORDER — PROPOFOL 10 MG/ML IV BOLUS
INTRAVENOUS | Status: AC
  Filled 2018-09-29: qty 20

## 2018-09-29 MED ORDER — ACETAMINOPHEN 10 MG/ML IV SOLN: 1000.0000 mg | Freq: Once | INTRAVENOUS | Status: DC | PRN

## 2018-09-29 MED ORDER — CEFAZOLIN SODIUM-DEXTROSE 1-4 GM/50ML-% IV SOLN
1.0000 g | Freq: Three times a day (TID) | INTRAVENOUS | Status: AC
  Administered 2018-09-29 – 2018-09-30 (×2): 1 g via INTRAVENOUS
  Filled 2018-09-29 (×2): qty 50

## 2018-09-29 MED ORDER — ONDANSETRON HCL 4 MG PO TABS: 4.0000 mg | ORAL_TABLET | ORAL | Status: DC | PRN

## 2018-09-29 MED ORDER — OXYCODONE HCL 5 MG/5ML PO SOLN: 5.0000 mg | Freq: Once | ORAL | Status: DC | PRN

## 2018-09-29 MED ORDER — PROPOFOL 10 MG/ML IV BOLUS
INTRAVENOUS | Status: DC | PRN
  Administered 2018-09-29: 120 mg via INTRAVENOUS
  Administered 2018-09-29: 80 mg via INTRAVENOUS

## 2018-09-29 MED ORDER — FLEET ENEMA 7-19 GM/118ML RE ENEM: 1.0000 | ENEMA | Freq: Once | RECTAL | Status: DC | PRN

## 2018-09-29 MED ORDER — OXYCODONE HCL 5 MG PO TABS: 5.0000 mg | ORAL_TABLET | Freq: Once | ORAL | Status: DC | PRN

## 2018-09-29 MED ORDER — LIDOCAINE 2% (20 MG/ML) 5 ML SYRINGE
INTRAMUSCULAR | Status: DC | PRN
  Administered 2018-09-29: 80 mg via INTRAVENOUS

## 2018-09-29 MED ORDER — NALOXONE HCL 0.4 MG/ML IJ SOLN: 0.0800 mg | INTRAMUSCULAR | Status: DC | PRN

## 2018-09-29 MED ORDER — ACETAMINOPHEN 325 MG PO TABS
650.0000 mg | ORAL_TABLET | ORAL | Status: DC | PRN
  Administered 2018-09-29 – 2018-09-30 (×2): 650 mg via ORAL
  Filled 2018-09-29 (×2): qty 2

## 2018-09-29 MED ORDER — BUPIVACAINE HCL (PF) 0.5 % IJ SOLN
INTRAMUSCULAR | Status: AC
  Filled 2018-09-29: qty 30

## 2018-09-29 MED ORDER — ROCURONIUM BROMIDE 10 MG/ML (PF) SYRINGE
PREFILLED_SYRINGE | INTRAVENOUS | Status: AC
  Filled 2018-09-29: qty 10

## 2018-09-29 MED ORDER — MIDAZOLAM HCL 2 MG/2ML IJ SOLN
INTRAMUSCULAR | Status: AC
  Filled 2018-09-29: qty 2

## 2018-09-29 MED ORDER — SODIUM CHLORIDE 0.9 % IV SOLN
INTRAVENOUS | Status: DC | PRN
  Administered 2018-09-29: 17:00:00 via INTRAVENOUS

## 2018-09-29 MED ORDER — PANTOPRAZOLE SODIUM 40 MG IV SOLR
40.0000 mg | Freq: Every day | INTRAVENOUS | Status: DC
  Administered 2018-09-29 – 2018-09-30 (×2): 40 mg via INTRAVENOUS
  Filled 2018-09-29 (×2): qty 40

## 2018-09-29 MED ORDER — LABETALOL HCL 5 MG/ML IV SOLN: 10.0000 mg | INTRAVENOUS | Status: DC | PRN

## 2018-09-29 MED ORDER — POLYETHYLENE GLYCOL 3350 17 G PO PACK: 17.0000 g | PACK | Freq: Every day | ORAL | Status: DC | PRN

## 2018-09-29 MED ORDER — BACITRACIN ZINC 500 UNIT/GM EX OINT
TOPICAL_OINTMENT | CUTANEOUS | Status: DC | PRN
  Administered 2018-09-29: 1 via TOPICAL

## 2018-09-29 MED ORDER — FENTANYL CITRATE (PF) 250 MCG/5ML IJ SOLN
INTRAMUSCULAR | Status: DC | PRN
  Administered 2018-09-29: 150 ug via INTRAVENOUS

## 2018-09-29 MED ORDER — PROMETHAZINE HCL 12.5 MG PO TABS
12.5000 mg | ORAL_TABLET | ORAL | Status: DC | PRN
  Filled 2018-09-29: qty 2

## 2018-09-29 MED ORDER — ONDANSETRON HCL 4 MG/2ML IJ SOLN
INTRAMUSCULAR | Status: AC
  Filled 2018-09-29: qty 2

## 2018-09-29 MED ORDER — 0.9 % SODIUM CHLORIDE (POUR BTL) OPTIME
TOPICAL | Status: DC | PRN
  Administered 2018-09-29 (×3): 1000 mL

## 2018-09-29 MED ORDER — THROMBIN 20000 UNITS EX SOLR
CUTANEOUS | Status: DC | PRN
  Administered 2018-09-29: 20 mL via TOPICAL

## 2018-09-29 MED ORDER — SODIUM CHLORIDE 0.9 % IV SOLN
INTRAVENOUS | Status: DC
  Administered 2018-09-29 – 2018-10-01 (×2): via INTRAVENOUS

## 2018-09-29 MED ORDER — LEVETIRACETAM IN NACL 500 MG/100ML IV SOLN
500.0000 mg | Freq: Two times a day (BID) | INTRAVENOUS | Status: DC
  Administered 2018-09-29 – 2018-10-01 (×4): 500 mg via INTRAVENOUS
  Filled 2018-09-29 (×4): qty 100

## 2018-09-29 MED ORDER — HYDROMORPHONE HCL 1 MG/ML IJ SOLN: 0.5000 mg | INTRAMUSCULAR | Status: DC | PRN

## 2018-09-29 MED ORDER — SUGAMMADEX SODIUM 200 MG/2ML IV SOLN
INTRAVENOUS | Status: DC | PRN
  Administered 2018-09-29: 200 mg via INTRAVENOUS

## 2018-09-29 MED ORDER — ACETAMINOPHEN 650 MG RE SUPP: 650.0000 mg | RECTAL | Status: DC | PRN

## 2018-09-29 MED ORDER — LIDOCAINE-EPINEPHRINE 1 %-1:100000 IJ SOLN
INTRAMUSCULAR | Status: AC
  Filled 2018-09-29: qty 1

## 2018-09-29 MED ORDER — THROMBIN 5000 UNITS EX SOLR
OROMUCOSAL | Status: DC | PRN
  Administered 2018-09-29: 5 mL via TOPICAL

## 2018-09-29 MED ORDER — THROMBIN 5000 UNITS EX SOLR
CUTANEOUS | Status: AC
  Filled 2018-09-29: qty 5000

## 2018-09-29 MED ORDER — HYDROCODONE-ACETAMINOPHEN 5-325 MG PO TABS: 1.0000 | ORAL_TABLET | ORAL | Status: DC | PRN

## 2018-09-29 MED ORDER — LEVETIRACETAM IN NACL 1000 MG/100ML IV SOLN
1000.0000 mg | INTRAVENOUS | Status: AC
  Administered 2018-09-29: 17:00:00 1000 mg via INTRAVENOUS
  Filled 2018-09-29 (×2): qty 100

## 2018-09-29 MED ORDER — MIDAZOLAM HCL 2 MG/2ML IJ SOLN
INTRAMUSCULAR | Status: DC | PRN
  Administered 2018-09-29: 2 mg via INTRAVENOUS

## 2018-09-29 MED ORDER — ROCURONIUM BROMIDE 10 MG/ML (PF) SYRINGE
PREFILLED_SYRINGE | INTRAVENOUS | Status: DC | PRN
  Administered 2018-09-29: 30 mg via INTRAVENOUS
  Administered 2018-09-29: 70 mg via INTRAVENOUS
  Administered 2018-09-29: 30 mg via INTRAVENOUS

## 2018-09-29 MED ORDER — BUPIVACAINE HCL (PF) 0.5 % IJ SOLN
INTRAMUSCULAR | Status: DC | PRN
  Administered 2018-09-29: 5 mL

## 2018-09-29 MED ORDER — FENTANYL CITRATE (PF) 250 MCG/5ML IJ SOLN
INTRAMUSCULAR | Status: AC
  Filled 2018-09-29: qty 5

## 2018-09-29 SURGICAL SUPPLY — 103 items
BATTERY IQ STERILE (MISCELLANEOUS) ×2 IMPLANT
BENZOIN TINCTURE PRP APPL 2/3 (GAUZE/BANDAGES/DRESSINGS) IMPLANT
BLADE CLIPPER SURG (BLADE) ×4 IMPLANT
BLADE SAW GIGLI 16 STRL (MISCELLANEOUS) IMPLANT
BLADE SURG 15 STRL LF DISP TIS (BLADE) IMPLANT
BLADE SURG 15 STRL SS (BLADE)
BLADE ULTRA TIP 2M (BLADE) ×4 IMPLANT
BNDG GAUZE ELAST 4 BULKY (GAUZE/BANDAGES/DRESSINGS) IMPLANT
BNDG STRETCH 4X75 STRL LF (GAUZE/BANDAGES/DRESSINGS) IMPLANT
BUR ACORN 6.0 PRECISION (BURR) ×3 IMPLANT
BUR ACORN 6.0MM PRECISION (BURR) ×1
BUR ROUND FLUTED 4 SOFT TCH (BURR) ×1 IMPLANT
BUR ROUND FLUTED 4MM SOFT TCH (BURR) ×1
BUR SPIRAL ROUTER 2.3 (BUR) ×3 IMPLANT
BUR SPIRAL ROUTER 2.3MM (BUR) ×1
CANISTER SUCT 3000ML PPV (MISCELLANEOUS) ×8 IMPLANT
CARTRIDGE OIL MAESTRO DRILL (MISCELLANEOUS) ×2 IMPLANT
CATH VENTRIC 35X38 W/TROCAR LG (CATHETERS) IMPLANT
CLIP VESOCCLUDE MED 6/CT (CLIP) IMPLANT
CONT SPEC 4OZ CLIKSEAL STRL BL (MISCELLANEOUS) ×4 IMPLANT
COVER MAYO STAND STRL (DRAPES) IMPLANT
DECANTER SPIKE VIAL GLASS SM (MISCELLANEOUS) ×4 IMPLANT
DIFFUSER DRILL AIR PNEUMATIC (MISCELLANEOUS) ×4 IMPLANT
DRAIN SUBARACHNOID (WOUND CARE) IMPLANT
DRAPE HALF SHEET 40X57 (DRAPES) ×4 IMPLANT
DRAPE MICROSCOPE LEICA (MISCELLANEOUS) ×2 IMPLANT
DRAPE NEUROLOGICAL W/INCISE (DRAPES) ×4 IMPLANT
DRAPE STERI IOBAN 125X83 (DRAPES) ×2 IMPLANT
DRAPE SURG 17X23 STRL (DRAPES) IMPLANT
DRAPE WARM FLUID 44X44 (DRAPES) ×4 IMPLANT
DRSG ADAPTIC 3X8 NADH LF (GAUZE/BANDAGES/DRESSINGS) IMPLANT
DRSG TELFA 3X8 NADH (GAUZE/BANDAGES/DRESSINGS) ×4 IMPLANT
DURAPREP 6ML APPLICATOR 50/CS (WOUND CARE) ×4 IMPLANT
ELECT REM PT RETURN 9FT ADLT (ELECTROSURGICAL) ×4
ELECTRODE REM PT RTRN 9FT ADLT (ELECTROSURGICAL) ×2 IMPLANT
EVACUATOR 1/8 PVC DRAIN (DRAIN) IMPLANT
EVACUATOR SILICONE 100CC (DRAIN) IMPLANT
FORCEPS BIPOLAR SPETZLER 8 1.0 (NEUROSURGERY SUPPLIES) ×4 IMPLANT
GAUZE 4X4 16PLY RFD (DISPOSABLE) ×2 IMPLANT
GAUZE SPONGE 4X4 12PLY STRL (GAUZE/BANDAGES/DRESSINGS) ×4 IMPLANT
GLOVE BIO SURGEON STRL SZ7 (GLOVE) ×2 IMPLANT
GLOVE BIOGEL PI IND STRL 7.0 (GLOVE) IMPLANT
GLOVE BIOGEL PI IND STRL 7.5 (GLOVE) ×2 IMPLANT
GLOVE BIOGEL PI INDICATOR 7.0 (GLOVE) ×2
GLOVE BIOGEL PI INDICATOR 7.5 (GLOVE) ×2
GLOVE ECLIPSE 7.0 STRL STRAW (GLOVE) ×8 IMPLANT
GOWN STRL REUS W/ TWL LRG LVL3 (GOWN DISPOSABLE) ×4 IMPLANT
GOWN STRL REUS W/ TWL XL LVL3 (GOWN DISPOSABLE) IMPLANT
GOWN STRL REUS W/TWL 2XL LVL3 (GOWN DISPOSABLE) IMPLANT
GOWN STRL REUS W/TWL LRG LVL3 (GOWN DISPOSABLE) ×8
GOWN STRL REUS W/TWL XL LVL3 (GOWN DISPOSABLE) ×2
GRAFT DURAGEN MATRIX 3WX3L (Graft) ×2 IMPLANT
GRAFT DURAGEN MATRIX 3X3 SNGL (Graft) IMPLANT
HEMOSTAT POWDER KIT SURGIFOAM (HEMOSTASIS) ×4 IMPLANT
HEMOSTAT SURGICEL 2X14 (HEMOSTASIS) ×4 IMPLANT
HOOK DURA 1/2IN (MISCELLANEOUS) ×2 IMPLANT
IV NS 1000ML (IV SOLUTION) ×2
IV NS 1000ML BAXH (IV SOLUTION) ×2 IMPLANT
KIT BASIN OR (CUSTOM PROCEDURE TRAY) ×4 IMPLANT
KIT DRAIN CSF ACCUDRAIN (MISCELLANEOUS) IMPLANT
KIT TURNOVER KIT B (KITS) ×4 IMPLANT
KNIFE ARACHNOID DISP AM-24-S (MISCELLANEOUS) ×4 IMPLANT
MARKER SPHERE PSV REFLC 13MM (MARKER) ×8 IMPLANT
NEEDLE HYPO 22GX1.5 SAFETY (NEEDLE) ×4 IMPLANT
NS IRRIG 1000ML POUR BTL (IV SOLUTION) ×12 IMPLANT
OIL CARTRIDGE MAESTRO DRILL (MISCELLANEOUS) ×4
PACK CRANIOTOMY CUSTOM (CUSTOM PROCEDURE TRAY) ×4 IMPLANT
PAD DRESSING TELFA 3X8 NADH (GAUZE/BANDAGES/DRESSINGS) IMPLANT
PATTIES SURGICAL .25X.25 (GAUZE/BANDAGES/DRESSINGS) IMPLANT
PATTIES SURGICAL .5 X.5 (GAUZE/BANDAGES/DRESSINGS) IMPLANT
PATTIES SURGICAL .5 X3 (DISPOSABLE) IMPLANT
PATTIES SURGICAL 1/4 X 3 (GAUZE/BANDAGES/DRESSINGS) IMPLANT
PATTIES SURGICAL 1X1 (DISPOSABLE) IMPLANT
PIN MAYFIELD SKULL DISP (PIN) ×4 IMPLANT
PLATE 1.5/0.5 13MM BURR HOLE (Plate) ×4 IMPLANT
PLATE 1.5/0.5 18.5MM BURR HOLE (Plate) ×4 IMPLANT
RUBBERBAND STERILE (MISCELLANEOUS) ×4 IMPLANT
SCREW SELF DRILL HT 1.5/4MM (Screw) ×34 IMPLANT
SET CARTRIDGE AND TUBING (SET/KITS/TRAYS/PACK) ×2 IMPLANT
SPECIMEN JAR SMALL (MISCELLANEOUS) ×2 IMPLANT
SPONGE NEURO XRAY DETECT 1X3 (DISPOSABLE) IMPLANT
SPONGE SURGIFOAM ABS GEL 100 (HEMOSTASIS) ×4 IMPLANT
STAPLER VISISTAT 35W (STAPLE) ×4 IMPLANT
STOCKINETTE 6  STRL (DRAPES) ×2
STOCKINETTE 6 STRL (DRAPES) IMPLANT
SUT ETHILON 3 0 FSL (SUTURE) IMPLANT
SUT ETHILON 3 0 PS 1 (SUTURE) IMPLANT
SUT NURALON 4 0 TR CR/8 (SUTURE) ×10 IMPLANT
SUT SILK 0 TIES 10X30 (SUTURE) IMPLANT
SUT VIC AB 0 CT1 18XCR BRD8 (SUTURE) ×4 IMPLANT
SUT VIC AB 0 CT1 8-18 (SUTURE) ×4
SUT VIC AB 3-0 SH 8-18 (SUTURE) ×8 IMPLANT
TAPE CLOTH 1X10 TAN NS (GAUZE/BANDAGES/DRESSINGS) ×4 IMPLANT
TIP STANDARD 36KHZ (INSTRUMENTS) ×4
TIP STD 36KHZ (INSTRUMENTS) IMPLANT
TOWEL GREEN STERILE (TOWEL DISPOSABLE) ×4 IMPLANT
TOWEL GREEN STERILE FF (TOWEL DISPOSABLE) ×4 IMPLANT
TRAY FOLEY MTR SLVR 16FR STAT (SET/KITS/TRAYS/PACK) ×4 IMPLANT
TUBE CONNECTING 12'X1/4 (SUCTIONS) ×1
TUBE CONNECTING 12X1/4 (SUCTIONS) ×3 IMPLANT
UNDERPAD 30X30 (UNDERPADS AND DIAPERS) ×4 IMPLANT
WATER STERILE IRR 1000ML POUR (IV SOLUTION) ×4 IMPLANT
WRENCH TORQUE 36KHZ (INSTRUMENTS) ×2 IMPLANT

## 2018-09-29 NOTE — Transfer of Care (Signed)
Immediate Anesthesia Transfer of Care Note  Patient: Cody [name]  Procedure(s) Performed: Sterotactic Left craniotomy for resection of tumor (Left Head) APPLICATION OF CRANIAL NAVIGATION (Left )  Patient Location: PACU  Anesthesia Type:General  Level of Consciousness: oriented, sedated, drowsy, patient cooperative and responds to stimulation  Airway & Oxygen Therapy: Patient Spontanous Breathing and Patient connected to face mask oxygen  Post-op Assessment: Report given to RN, Post -op Vital signs reviewed and stable and Patient moving all extremities X 4  Post vital signs: Reviewed and stable  Last Vitals:  Vitals Value Taken Time  BP 143/70 09/29/18 1913  Temp    Pulse 65 09/29/18 1921  Resp 14 09/29/18 1921  SpO2 100 % 09/29/18 1921  Vitals shown include unvalidated device data.  Last Pain:  Vitals:   09/29/18 1200  TempSrc: Oral  PainSc: 0-No pain         Complications: No apparent anesthesia complications

## 2018-09-29 NOTE — Progress Notes (Signed)
Left arterial line connector broken upon arrival to PACU.  Unable to transduce arterial line.  Per Dr. Smith Robert, okay to remove.  Line removed, catheter intact.  Pressure held for 10 minutes, pressure dressing applied.  Will continue to monitor.

## 2018-09-29 NOTE — Anesthesia Procedure Notes (Signed)
Arterial Line Insertion Start/End8/27/2020 3:05 PM, 09/29/2018 3:15 PM Performed by: Moshe Salisbury, CRNA, CRNA  Patient location: Pre-op. Preanesthetic checklist: patient identified, IV checked, site marked, risks and benefits discussed, surgical consent, monitors and equipment checked, pre-op evaluation, timeout performed and anesthesia consent Lidocaine 1% used for infiltration Left, radial was placed Catheter size: 20 G Hand hygiene performed , maximum sterile barriers used  and Seldinger technique used Allen's test indicative of satisfactory collateral circulation Attempts: 1 Procedure performed without using ultrasound guided technique. Following insertion, dressing applied and Biopatch. Post procedure assessment: normal  Patient tolerated the procedure well with no immediate complications.

## 2018-09-29 NOTE — Op Note (Signed)
NEUROSURGERY OPERATIVE NOTE   PREOP DIAGNOSIS:  1. Left occipital tumor   POSTOP DIAGNOSIS: Same  PROCEDURE: 1. Stereotactic left occipital craniotomy for resection of tumor 2. Use of microscope for microdissection  SURGEON: Dr. [name], MD  ASSISTANT: Dr. Jovita Gamma, MD  ANESTHESIA: General Endotracheal  EBL: 50cc  SPECIMENS: Left occipital tumor for permanent pathology  DRAINS: None  COMPLICATIONS: None immediate  CONDITION: Hemodynamically stable to PACU  HISTORY: Cody [name] is a 58 y.o. male initially presented to the emergency department with headache and right-sided field deficit.  Work-up included CT scan which demonstrated a partially cystic left occipital mass.  Further work-up included MRI with and without contrast which confirmed the presence of a peripherally enhancing partially cystic left occipital lesion with enhancing tail tracking down to into the ependyma surrounding the left lateral ventricle.  CT chest abdomen pelvis did not demonstrate any suggestion of primary systemic malignancy.  Surgical resection for diagnosis and relief of mass-effect was therefore indicated.  The risks and benefits of the surgery were reviewed in detail with the patient and his wife.  After all questions were answered informed consent was obtained and witnessed.  PROCEDURE IN DETAIL: The patient was brought to the operating room. After induction of general anesthesia, the patient was positioned on the operative table in the right lateral decubitus position in the [place] head holder. All pressure points were meticulously padded.  The head was then turned to expose the left occipital scalp.  Utilizing the preoperative stereotactic MRI scan, surface markers were co-registered until a satisfactory accuracy was achieved.  The stereotactic system was then used to identify the surface projection of the superior sagittal sinus and transverse sinus.  Sigmoid shaped skin  incision was then planned out to allow access to the entire tumor, just lateral to the location of the superior sagittal sinus.  Skin incision was then marked out and prepped and draped in the usual sterile fashion.  After timeout was conducted, the incision was infiltrated with local anesthetic with epinephrine.  Incision was then made sharply and carried down through the galea.  The superior nuchal line was then identified, and Bovie electrocautery was used to incise the nuchal fascia.  Subperiosteal dissection was then carried out to expose the occipital bone.  Self-retaining retractors were then placed.  Stereotactic system was again used to identify the projection of the superior sagittal sinus in the midline.  Bur holes were then planned out to allow access to the tumor just lateral to the superior sagittal sinus.  These were created with a high-speed drill and connected with a craniotome.  Single piece craniotomy flap was then elevated.  Hemostasis was secured on the epidural surface using bipolar electrocautery.  At this point, the dura was incised in stellate fashion.  The underlying brain was noted to be under significant tension and began to herniate through the craniotomy defect.  At this point the microscope was draped sterilely and brought into the field and the remainder of the case was done under the microscope using microdissection technique.  The partially cystic tumor was clearly identifiable just below the knee the occipital pia.  Using a combination of bipolar electrocautery and the ultrasonic aspirator on very low settings, the peel was coagulated and incised, and the tumor was superficially dissected away from the surrounding white matter.  A fairly good pseudo-plane was identified.  This was developed initially in the lateral aspect of the tumor followed by the superior and inferior margins of  the tumor.  Finally, the deep margin of the tumor was dissected and carried towards the medial  border of the tumor towards the falx cerebrally.  In this manner the tumor was completely circumferentially dissected and removed en bloc and sent for permanent pathology.  At this point, the tumor bed was inspected, and a small amount of remaining tumor was identified medially abutting the falx.  This was again removed with bipolar electrocautery and the ultrasonic aspirator.  At this point the remainder of the tumor bed was identified to be free of any remaining tumor.    Hemostasis was then secured on the resection cavity using a combination of bipolar electrocautery and morselized Gelfoam with thrombin.  The dural leaflets were then reapproximated with interrupted 4-0 Nurolon stitches.  A collagen onlay graft was then placed.  The bone flap was replaced and plated with standard titanium plates and screws.  The nuchal fascia was then reapproximated with 0 Vicryl stitches.  The galea was reapproximated with interrupted 0 and 3-0 Vicryl stitches and surgical skin staples were placed.  Bacitracin ointment and sterile dressing was then applied.    At the end of the case all sponge, needle, instrument, and cottonoid counts were correct.  The patient was then transferred to the bed and extubated.  He was taken to the postanesthesia care unit in stable hemodynamic condition.

## 2018-09-29 NOTE — Anesthesia Procedure Notes (Signed)
Procedure Name: Intubation Date/Time: 09/29/2018 4:35 PM Performed by: Elayne Snare, CRNA Pre-anesthesia Checklist: Patient identified, Emergency Drugs available, Suction available and Patient being monitored Patient Re-evaluated:Patient Re-evaluated prior to induction Oxygen Delivery Method: Circle System Utilized Preoxygenation: Pre-oxygenation with 100% oxygen Induction Type: IV induction Ventilation: Mask ventilation without difficulty Laryngoscope Size: Mac and 4 Grade View: Grade I Tube type: Oral Tube size: 7.5 mm Number of attempts: 1 Airway Equipment and Method: Stylet Placement Confirmation: ETT inserted through vocal cords under direct vision,  positive ETCO2 and breath sounds checked- equal and bilateral Secured at: 23 cm Tube secured with: Tape Dental Injury: Teeth and Oropharynx as per pre-operative assessment

## 2018-09-29 NOTE — Anesthesia Preprocedure Evaluation (Signed)
Anesthesia Evaluation  Patient identified by MRN, date of birth, ID band Patient awake    Reviewed: Allergy & Precautions, NPO status , Patient's Chart, lab work & pertinent test results  History of Anesthesia Complications Negative for: history of anesthetic complications  Airway Mallampati: II  TM Distance: >3 FB Neck ROM: Full    Dental  (+) Dental Advisory Given   Pulmonary neg pulmonary ROS, neg recent URI,    breath sounds clear to auscultation       Cardiovascular negative cardio ROS   Rhythm:Regular     Neuro/Psych Brain tumor negative psych ROS   GI/Hepatic negative GI ROS, Neg liver ROS,   Endo/Other  negative endocrine ROS  Renal/GU negative Renal ROS     Musculoskeletal negative musculoskeletal ROS (+)   Abdominal   Peds  Hematology negative hematology ROS (+)   Anesthesia Other Findings   Reproductive/Obstetrics                             Anesthesia Physical Anesthesia Plan  ASA: II  Anesthesia Plan: General   Post-op Pain Management:    Induction: Intravenous  PONV Risk Score and Plan: 2 and Ondansetron and Dexamethasone  Airway Management Planned: Oral ETT  Additional Equipment: None  Intra-op Plan:   Post-operative Plan: Extubation in OR  Informed Consent: I have reviewed the patients History and Physical, chart, labs and discussed the procedure including the risks, benefits and alternatives for the proposed anesthesia with the patient or authorized representative who has indicated his/her understanding and acceptance.     Dental advisory given  Plan Discussed with: CRNA and Surgeon  Anesthesia Plan Comments:         Anesthesia Quick Evaluation

## 2018-09-29 NOTE — Progress Notes (Signed)
  NEUROSURGERY PROGRESS NOTE   No issues overnight. No complaints this am. Pt doing well sitting in chair, wife at bedside.  EXAM:  BP 122/65   Pulse (!) 50   Temp 98 F (36.7 C) (Oral)   Resp 17   Ht 6' (1.829 m)   Wt 81.5 kg   SpO2 98%   BMI 24.37 kg/m   Awake, alert, oriented  Speech fluent, appropriate  CN grossly intact x stable right field cut 5/5 BUE/BLE   IMPRESSION:  58 y.o. 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 All their questions today were answered. They are ready to proceed with surgery.

## 2018-09-29 NOTE — Progress Notes (Signed)
  NEUROSURGERY PROGRESS NOTE   No issues overnight. Mild HA currently, otherwise well.  EXAM:  BP 122/65   Pulse (!) 50   Temp 98 F (36.7 C) (Oral)   Resp 17   Ht 6' (1.829 m)   Wt 81.5 kg   SpO2 98%   BMI 24.37 kg/m   Awake, alert, oriented  Speech fluent, appropriate  CN grossly intact x right homonymous hemianopsia on confrontation testing. 5/5 BUE/BLE   IMAGING: MRI of the brain with and without contrast was reviewed.  This demonstrates evidence of prior left frontal trauma or ischemic event.  There is a relatively large partially cystic peripherally enhancing lesion in the left occipital lobe which abuts the distal portion of the superior sagittal sinus.  There is associated surrounding edema.  The lesion appears to track down towards the trigone of the left lateral ventricle.  CT chest abdomen pelvis was also reviewed which does not suggest the presence of any primary systemic malignancy.  IMPRESSION:  58 y.o. 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  PLAN: -We will plan on proceeding with stereotactic left occipital craniotomy for resection of the lesion.  I did review the imaging findings with the patient and his wife who was on speaker phone.  We discussed possible diagnoses including metastatic disease versus more likely glioma possibly high-grade glioma.  Treatment options were discussed including my recommendation for surgical resection for diagnosis and relief of mass-effect.  Risks of the procedure were reviewed in detail including the risk of worsening visual deficit, weakness, stroke, seizure, hydrocephalus, and bleeding.  General risks of anesthesia were also reviewed including heart attack, stroke, and blood clots.  We discussed expected postoperative course.  We also discussed the fact that further adjuvant treatment would be determined based on  pathology.  Patient and his wife appear to understand our discussion.  All their questions today were answered.  They are willing to proceed with surgery tomorrow.

## 2018-09-30 ENCOUNTER — Inpatient Hospital Stay (HOSPITAL_COMMUNITY): Payer: BC Managed Care – PPO

## 2018-09-30 ENCOUNTER — Other Ambulatory Visit: Payer: Self-pay | Admitting: Radiation Therapy

## 2018-09-30 ENCOUNTER — Encounter (HOSPITAL_COMMUNITY): Payer: Self-pay

## 2018-09-30 LAB — BASIC METABOLIC PANEL
Anion gap: 9 (ref 5–15)
BUN: 18 mg/dL (ref 6–20)
CO2: 25 mmol/L (ref 22–32)
Calcium: 8.6 mg/dL — ABNORMAL LOW (ref 8.9–10.3)
Chloride: 105 mmol/L (ref 98–111)
Creatinine, Ser: 1.39 mg/dL — ABNORMAL HIGH (ref 0.61–1.24)
GFR calc Af Amer: >60 mL/min (ref >60)
GFR calc non Af Amer: 57 mL/min — ABNORMAL LOW (ref >60)
Glucose, Bld: 148 mg/dL — ABNORMAL HIGH (ref 70–99)
Potassium: 4.9 mmol/L (ref 3.5–5.1)
Sodium: 139 mmol/L (ref 135–145)

## 2018-09-30 LAB — CBC
HCT: 39.3 % (ref 39.0–52.0)
Hemoglobin: 13.8 g/dL (ref 13.0–17.0)
MCH: 28.9 pg (ref 26.0–34.0)
MCHC: 35.1 g/dL (ref 30.0–36.0)
MCV: 82.4 fL (ref 80.0–100.0)
Platelets: 209 10*3/uL (ref 150–400)
RBC: 4.77 MIL/uL (ref 4.22–5.81)
RDW: 12.7 % (ref 11.5–15.5)
WBC: 23.1 10*3/uL — ABNORMAL HIGH (ref 4.0–10.5)
nRBC: 0 % (ref 0.0–0.2)

## 2018-09-30 IMAGING — MR MRI HEAD WITHOUT AND WITH CONTRAST
14 of 16 series · 40 of 48 positions shown · IV contrast (Gadavist)
Comparison: [DATE]

CLINICAL DATA: Postop day 1 status post left occipital tumor
resection.

EXAM:
MRI HEAD WITHOUT AND WITH CONTRAST
TECHNIQUE: Multiplanar, multiecho pulse sequences of the brain and surrounding
structures were obtained without and with intravenous contrast.
CONTRAST:  8 mL Gadavist

[Series 5: DWI · axial · 3.0mm · 0.88mm/px · z∈[-75,+69]mm · 6 of 100 slices shown (1 of 4)]
[im 1/100]
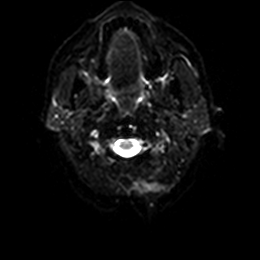
[im 20/100]
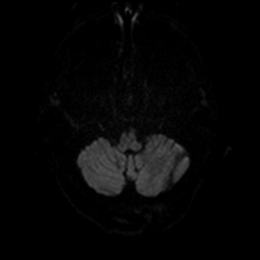
[im 40/100]
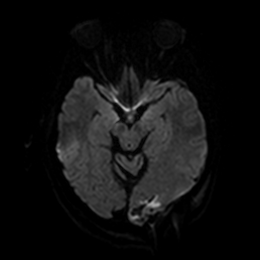
[im 60/100]
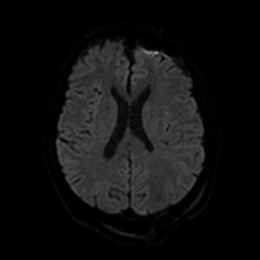
[im 80/100]
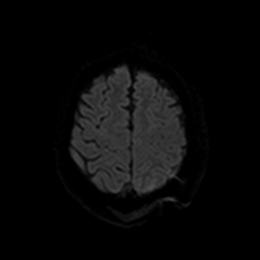
[im 100/100]
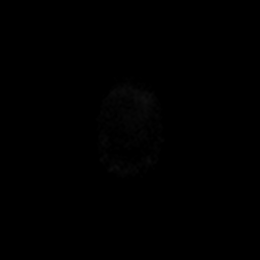

[Series 6: DWI · axial · 3.0mm · 0.88mm/px · z∈[-75,+69]mm · 3 of 50 slices shown (2 of 4)]
[im 1/50]
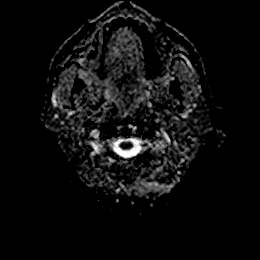
[im 25/50]
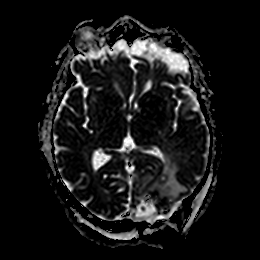
[im 50/50]
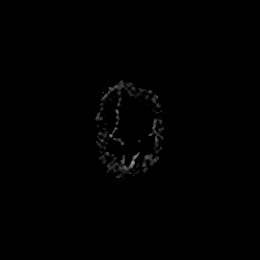

[Series 7: DWI · coronal · 4.0mm · 0.88mm/px · 4 of 68 slices shown (3 of 4)]
[im 1/68]
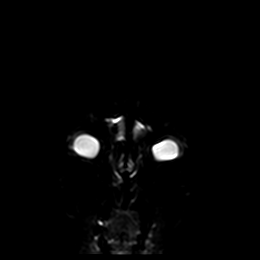
[im 23/68]
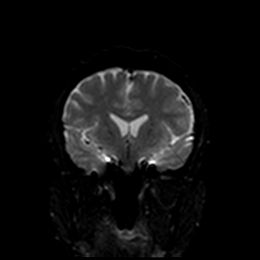
[im 45/68]
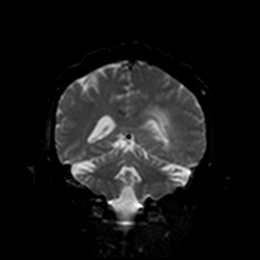
[im 68/68]
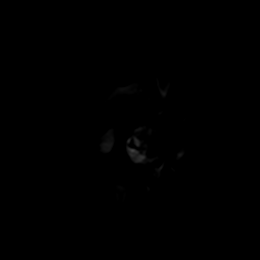

[Series 8: DWI · coronal · 4.0mm · 0.88mm/px · 2 of 34 slices shown (4 of 4)]
[im 1/34]
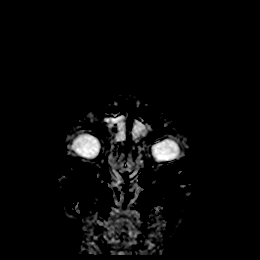
[im 34/34]
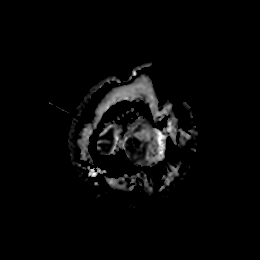

[Series 9: T1 · sagittal · 5.0mm · 0.75mm/px · 1 of 23 slices shown]
[im 1/23]
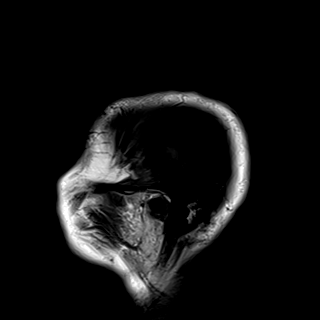

[Series 10: T2 · axial · 5.0mm · 0.72mm/px · z∈[-72,+69]mm · 2 of 25 slices shown]
[im 1/25]
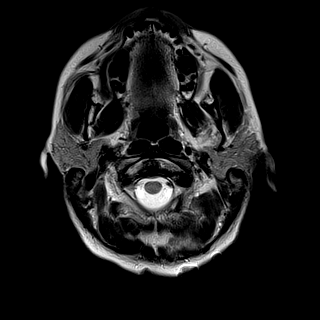
[im 25/25]
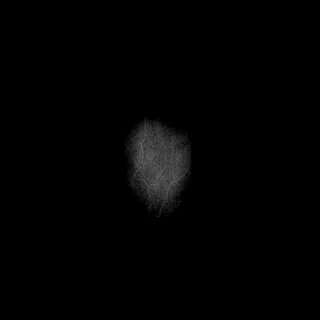

[Series 11: FLAIR · axial · 5.0mm · 0.45mm/px · z∈[-71,+70]mm · 2 of 25 slices shown]
[im 1/25]
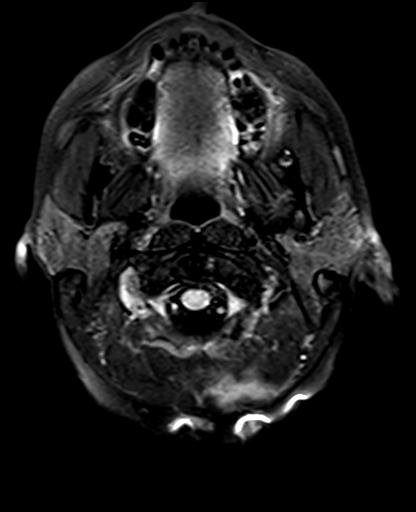
[im 25/25]
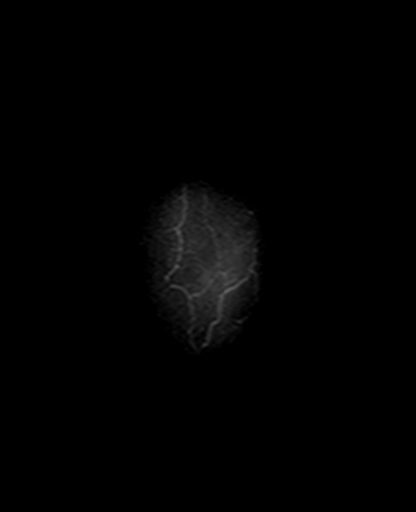

[Series 12: mag_images · axial · 3.0mm · 0.90mm/px · z∈[-88,+87]mm · 4 of 60 slices shown]
[im 1/60]
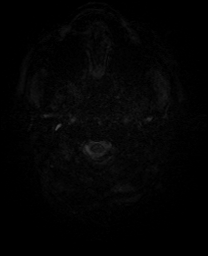
[im 20/60]
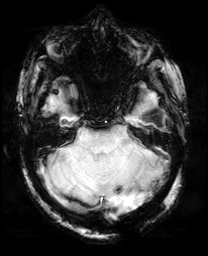
[im 40/60]
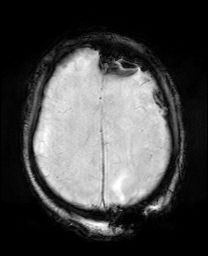
[im 60/60]
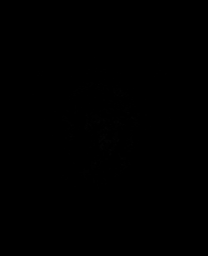

[Series 13: pha_images · axial · 3.0mm · 0.90mm/px · z∈[-88,+87]mm · 4 of 60 slices shown]
[im 1/60]
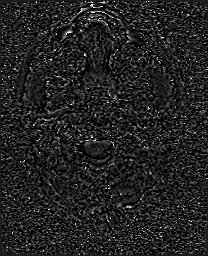
[im 20/60]
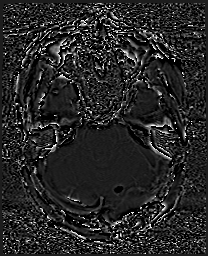
[im 40/60]
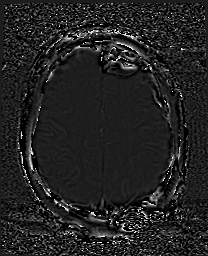
[im 60/60]
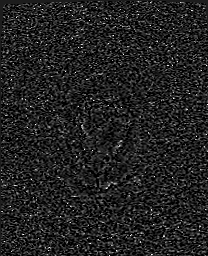

[Series 14: swi_images · axial · 3.0mm · 0.90mm/px · z∈[-88,+87]mm · 4 of 60 slices shown]
[im 1/60]
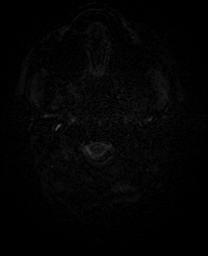
[im 20/60]
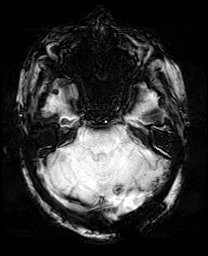
[im 40/60]
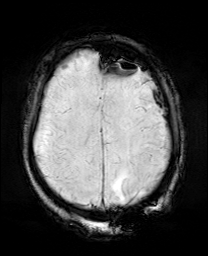
[im 60/60]
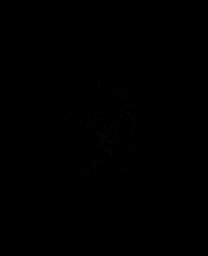

[Series 15: mip_images(sw) · axial · 24.0mm · 0.90mm/px · z∈[-77,+76]mm · 3 of 53 slices shown]
[im 1/53]
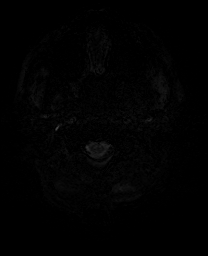
[im 27/53]
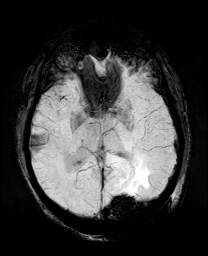
[im 53/53]
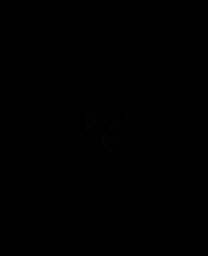

[Series 17: T2 post-contrast · coronal · 5.0mm · 0.72mm/px · 2 of 30 slices shown]
[im 1/30]
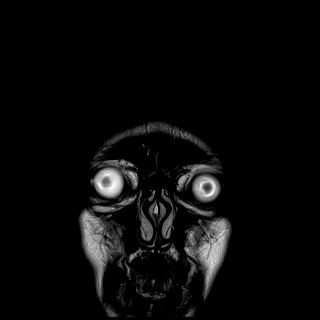
[im 30/30]
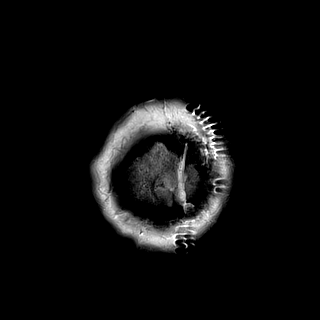

[Series 19: T1 post-contrast · coronal · 5.0mm · 0.34mm/px · 2 of 30 slices shown (1 of 2)]
[im 1/30]
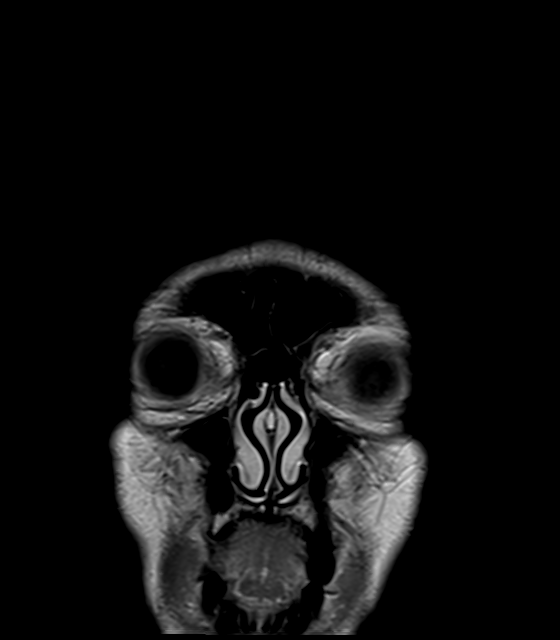
[im 30/30]
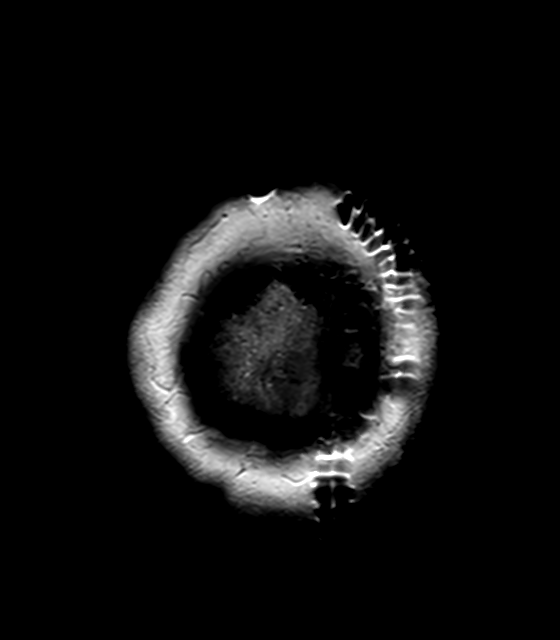

[Series 20: T1 post-contrast · sagittal · 5.0mm · 0.72mm/px · 1 of 23 slices shown (2 of 2)]
[im 1/23]
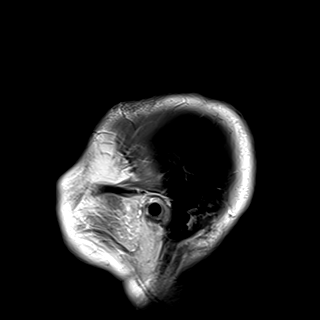

[40 of 48 positions shown; findings below may reference images not displayed]

FINDINGS: Brain: Sequelae of interval left occipital tumor resection are
identified with blood products in the resection bed. There is small
volume pneumocephalus. There is moderate residual edema in the left
occipital and parietal lobes with decreased regional mass effect.
The atrium of the left lateral ventricle remains partially effaced.

There is small volume nodular and curvilinear enhancement along the
margins of the resection cavity including a 4 mm nodular focus
anterosuperiorly (series 18, image 29). There is residual enhancing
tumor anterior to the resection cavity involving the occipital horn
of the left lateral ventricle with ependymal enhancement (series 18,
image 27). A 2 mm satellite focus of enhancement more inferiorly in
the left occipital lobe is unchanged (series 18, image 19), and
there is also a 3 mm focus of enhancement anterior and lateral to
the resection cavity in the occipital lobe (series 18, image 23).

An extra-axial fluid collection subjacent to the craniotomy measures
8 mm in thickness. Mild dural enhancement relatively diffusely over
the left cerebral convexity is likely postoperative. No sizable
acute infarct is evident. Encephalomalacia is again noted anteriorly
in the frontal lobes compatible with remote trauma.

Vascular: Major intracranial vascular flow voids are preserved.

Skull and upper cervical spine: Left occipital craniotomy with
overlying scalp swelling.

Sinuses/Orbits: Unremarkable orbits. Paranasal sinuses and mastoid
air cells are clear.

Other: None.
IMPRESSION: Interval left occipital tumor resection with decreased mass effect
and residual tumor as above, most notably involving the occipital
horn of the left lateral ventricle.

## 2018-09-30 MED ORDER — GADOBUTROL 1 MMOL/ML IV SOLN
10.0000 mL | Freq: Once | INTRAVENOUS | Status: AC | PRN
  Administered 2018-09-30: 09:00:00 8 mL via INTRAVENOUS

## 2018-09-30 NOTE — Evaluation (Signed)
Occupational Therapy Evaluation Patient Details Name: Cody [name] MRN: [id number] DOB: 18-Jun-1960 Today's Date: 09/30/2018    History of Present Illness 58 y.o. M with no significant PMH who presents with severe headache. MRI showing 6 x 3 x 4 cm mass in left occipital lobe with extensive necrosis. Extension to the occipital horn of left lateral ventricle with ependymal enhanacement. Regional vasogenic edema. Now s/p stereotactic left craniotomy 8/27.    Clinical Impression   Patient evaluated by Occupational Therapy with no further acute OT needs identified. All education has been completed and the patient has no further questions. See below for any follow-up Occupational Therapy or equipment needs. OT to sign off. Thank you for referral.   Recommending patient follow up with eye doctor of his choice regarding visual deficits and possible need for humphrey evaluation. Pt demonstrates R quadrant visual changes during OT session. Pt expressed being grateful for session to help him recognize the deficit. Pt is able to read fluently without errors at the same font size but when scanning is consistently making errors in the same area. Pt could benefit after a humphrey exam from outpatient OT to help with compensatory strategies related to work functions.     Follow Up Recommendations  Outpatient OT    Equipment Recommendations  None recommended by OT    Recommendations for Other Services       Precautions / Restrictions Precautions Precautions: None Restrictions Weight Bearing Restrictions: No      Mobility Bed Mobility Overal bed mobility: Independent                Transfers Overall transfer level: Independent Equipment used: None                  Balance Overall balance assessment: Independent                                         ADL either performed or assessed with clinical judgement   ADL Overall ADL's : Modified independent                                              Vision   Vision Assessment?: Yes Additional Comments: pt provided a visual scanning task with cancellation requirements. pt was consistently missing right quadrant errors. pt has a clear line downward of right upward quadrant of the left side of the page.      Perception     Praxis      Pertinent Vitals/Pain Pain Assessment: No/denies pain     Hand Dominance Right   Extremity/Trunk Assessment Upper Extremity Assessment Upper Extremity Assessment: Overall WFL for tasks assessed   Lower Extremity Assessment Lower Extremity Assessment: Overall WFL for tasks assessed   Cervical / Trunk Assessment Cervical / Trunk Assessment: Normal   Communication Communication Communication: No difficulties   Cognition Arousal/Alertness: Awake/alert Behavior During Therapy: WFL for tasks assessed/performed Overall Cognitive Status: Within Functional Limits for tasks assessed                                     General Comments       Exercises     Shoulder Instructions  Home Living Family/patient expects to be discharged to:: Private residence Living Arrangements: Spouse/significant other;Children(has 12 y.o., 33 y.o.) Available Help at Discharge: Family;Available PRN/intermittently Type of Home: House             Bathroom Shower/Tub: Walk-in Corporate treasurer Toilet: Standard     Home Equipment: None   Additional Comments: works as Hydrographic surveyor. wife works for an Therapist, art       Prior Functioning/Environment Level of Independence: Independent        Comments: Works as Building control surveyor Problem List:        OT Treatment/Interventions:      OT Goals(Current goals can be found in the care plan section) Acute Rehab OT Goals Patient Stated Goal: to follow up regarding his vision  OT Frequency:     Barriers to D/C:            Co-evaluation               AM-PAC OT "6 Clicks" Daily Activity     Outcome Measure Help from another person eating meals?: None Help from another person taking care of personal grooming?: None Help from another person toileting, which includes using toliet, bedpan, or urinal?: None Help from another person bathing (including washing, rinsing, drying)?: None Help from another person to put on and taking off regular upper body clothing?: None Help from another person to put on and taking off regular lower body clothing?: None 6 Click Score: 24   End of Session Nurse Communication: Mobility status;Precautions  Activity Tolerance: Patient tolerated treatment well Patient left: in bed;with call bell/phone within reach;with nursing/sitter in room;with family/visitor present  OT Visit Diagnosis: Other (comment)(vision deficits)                Time: 1100-1135 OT Time Calculation (min): 35 min Charges:  OT General Charges $OT Visit: 1 Visit OT Evaluation $OT Eval Moderate Complexity: 1 Mod   Jeri Modena, OTR/L  Acute Rehabilitation Services Pager: [phone] Office: [phone] .   Jeri Modena 09/30/2018, 3:39 PM

## 2018-09-30 NOTE — Anesthesia Postprocedure Evaluation (Signed)
Anesthesia Post Note  Patient: Cody [name]  Procedure(s) Performed: Sterotactic Left craniotomy for resection of tumor (Left Head) APPLICATION OF CRANIAL NAVIGATION (Left )     Patient location during evaluation: PACU Anesthesia Type: General Level of consciousness: awake and patient cooperative Pain management: pain level controlled Vital Signs Assessment: post-procedure vital signs reviewed and stable Respiratory status: spontaneous breathing, nonlabored ventilation, respiratory function stable and patient connected to nasal cannula oxygen Cardiovascular status: blood pressure returned to baseline and stable Postop Assessment: no apparent nausea or vomiting Anesthetic complications: no    Last Vitals:  Vitals:   09/30/18 0600 09/30/18 0700  BP: (!) 114/48 126/78  Pulse: (!) 49 (!) 52  Resp: 16 13  Temp:    SpO2: 98% 100%    Last Pain:  Vitals:   09/30/18 0400  TempSrc: Oral  PainSc: 0-No pain                 Gerrett Loman

## 2018-09-30 NOTE — Progress Notes (Signed)
  NEUROSURGERY PROGRESS NOTE   No issues overnight. Pt reports mild HA, different from preop. Reports subjective improvement in peripheral vision. Ambulating well, tolerating diet. Foley out and has voided.  EXAM:  BP 132/78 (BP Location: Left Arm)   Pulse (!) 119   Temp 98.5 F (36.9 C) (Oral)   Resp 18   Ht 6' (1.829 m)   Wt 81.5 kg   SpO2 94%   BMI 24.37 kg/m   Awake, alert, oriented  Speech fluent, appropriate  CN grossly intact  5/5 BUE/BLE   IMAGING: Postop MRI reviewed demonstrating small residual tumor within the ependyma of the left occipital horn/trigone. Surrounding edema is stable.  IMPRESSION:  58 y.o. male POD#1 s/p resection of left occipital peripherally enhancing tumor, likely HGG. Doing very well at neurologic baseline  PLAN: - Cont routine postop care, ambulate ad lib - Plan on d/c home tomorrow

## 2018-09-30 NOTE — Evaluation (Signed)
Physical Therapy Evaluation Patient Details Name: Cody [name] MRN: [id number] DOB: 07/18/1960 Today's Date: 09/30/2018   History of Present Illness  Pt is a 58 y.o. M with no significant PMH who presents with severe headache. MRI showing 6 x 3 x 4 cm mass in left occipital lobe with extensive necrosis. Extension to the occipital horn of left lateral ventricle with ependymal enhanacement. Regional vasogenic edema. Now s/p stereotactic left craniotomy 8/27.   Clinical Impression  Patient evaluated by Physical Therapy with no further acute PT needs identified. Pt reports he feels back to functional baseline other than mild headache. Ambulating block around unit independently. Able to way find, negotiate obstacles, and perform head turns without difficulty or dizziness. Visual field assessment normal. All education has been completed and the patient has no further questions. No follow-up Physical Therapy or equipment needs. PT is signing off. Thank you for this referral.     Follow Up Recommendations No PT follow up    Equipment Recommendations  None recommended by PT    Recommendations for Other Services       Precautions / Restrictions Precautions Precautions: None Restrictions Weight Bearing Restrictions: No      Mobility  Bed Mobility Overal bed mobility: Independent                Transfers Overall transfer level: Independent Equipment used: None                Ambulation/Gait Ambulation/Gait assistance: Independent   Assistive device: None          Stairs            Wheelchair Mobility    Modified Rankin (Stroke Patients Only)       Balance Overall balance assessment: Independent                                           Pertinent Vitals/Pain Pain Assessment: Faces Faces Pain Scale: Hurts a little bit Pain Location: mild headache Pain Descriptors / Indicators: Headache Pain Intervention(s): Monitored during  session    Home Living Family/patient expects to be discharged to:: Private residence Living Arrangements: Spouse/significant other;Children(has 12 y.o., 33 y.o.) Available Help at Discharge: Family;Available PRN/intermittently Type of Home: House         Home Equipment: None      Prior Function Level of Independence: Independent         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)        Extremity/Trunk Assessment   Upper Extremity Assessment Upper Extremity Assessment: Overall WFL for tasks assessed    Lower Extremity Assessment Lower Extremity Assessment: Overall WFL for tasks assessed    Cervical / Trunk Assessment Cervical / Trunk Assessment: Normal  Communication   Communication: No difficulties  Cognition Arousal/Alertness: Awake/alert Behavior During Therapy: WFL for tasks assessed/performed Overall Cognitive Status: Within Functional Limits for tasks assessed                                        General Comments      Exercises     Assessment/Plan    PT Assessment Patent does not need any further PT services  PT Problem List         PT Treatment Interventions  PT Goals (Current goals can be found in the Care Plan section)  Acute Rehab PT Goals Patient Stated Goal: none stated; agreeable to assessment PT Goal Formulation: All assessment and education complete, DC therapy    Frequency     Barriers to discharge        Co-evaluation               AM-PAC PT "6 Clicks" Mobility  Outcome Measure Help needed turning from your back to your side while in a flat bed without using bedrails?: None Help needed moving from lying on your back to sitting on the side of a flat bed without using bedrails?: None Help needed moving to and from a bed to a chair (including a wheelchair)?: None Help needed standing up from a chair using your arms (e.g., wheelchair or bedside chair)?: None Help needed to walk in  hospital room?: None Help needed climbing 3-5 steps with a railing? : None 6 Click Score: 24    End of Session   Activity Tolerance: Patient tolerated treatment well Patient left: in bed;with call bell/phone within reach Nurse Communication: Mobility status PT Visit Diagnosis: Unsteadiness on feet (R26.81)    Time: [id number] PT Time Calculation (min) (ACUTE ONLY): 16 min   Charges:   PT Evaluation $PT Eval Low Complexity: 1 Low          Ellamae Sia, PT, DPT Acute Rehabilitation Services Pager [phone] Office [phone]   Willy Eddy 09/30/2018, 11:06 AM

## 2018-10-01 ENCOUNTER — Encounter (HOSPITAL_COMMUNITY): Payer: Self-pay

## 2018-10-01 MED ORDER — LEVETIRACETAM 500 MG PO TABS: 500.0000 mg | ORAL_TABLET | Freq: Two times a day (BID) | ORAL | 3 refills | Status: DC

## 2018-10-01 MED ORDER — DEXAMETHASONE 1 MG PO TABS: ORAL_TABLET | ORAL | 0 refills | Status: DC

## 2018-10-01 NOTE — Progress Notes (Signed)
Spoke to NP for neuro surgery  Stated to removed the four staples in the corner of the dressing and place 4x4 gauze  Also stated to have the patient call the office Monday to schedule a follow up in 10 days Informed pt

## 2018-10-01 NOTE — Discharge Summary (Signed)
Physician Discharge Summary  Patient ID: Cody [name] MRN: [id number] DOB/AGE: 1960/04/07 58 y.o.  Admit date: 09/27/2018 Discharge date: 10/01/2018  Admission Diagnoses: Left parieto-occipital brain tumor  Discharge Diagnoses: Left parietal occipital brain tumor (glioma) Active Problems:   Brain tumor [facility])   Discharged Condition: good  Hospital Course: Patient was admitted to undergo surgical resection of a left parieto-occipital brain tumor.  He tolerated surgery well.  He has been ambulatory.  He is independent.  He is being discharged home  Consults: None  Significant Diagnostic Studies: None  Treatments: surgery: Left parieto-occipital craniotomy and resection of brain tumor  Discharge Exam: Blood pressure 123/72, pulse (!) 48, temperature 98.2 F (36.8 C), temperature source Oral, resp. rate 12, height 6' (1.829 m), weight 81.5 kg, SpO2 100 %. Incision is clean and dry and motor function is good vision does not demonstrate homonymous hemianopsia  Disposition: Discharge disposition: 01-Home or Self Care       Discharge Instructions    Call MD for:  redness, tenderness, or signs of infection (pain, swelling, redness, odor or green/yellow discharge around incision site)   Complete by: As directed    Call MD for:  severe uncontrolled pain   Complete by: As directed    Call MD for:  temperature >100.4   Complete by: As directed    Diet - low sodium heart healthy   Complete by: As directed    Increase activity slowly   Complete by: As directed      Allergies as of 10/01/2018   No Known Allergies     Medication List    STOP taking these medications   predniSONE 20 MG tablet Commonly known as: DELTASONE     TAKE these medications   dexamethasone 1 MG tablet Commonly known as: DECADRON 2 tablets twice daily for 2 days, one tablet twice daily for 2 days, one tablet daily for 2 days.   finasteride 5 MG tablet Commonly known as: PROSCAR Take 5 mg by  mouth daily.   levETIRAcetam 500 MG tablet Commonly known as: Keppra Take 1 tablet (500 mg total) by mouth 2 (two) times daily.   tamsulosin 0.4 MG Caps capsule Commonly known as: FLOMAX Take 0.4 mg by mouth daily.        Signed: Blanchie Dessert Rastus [name] 10/01/2018, 10:21 AM

## 2018-10-01 NOTE — Progress Notes (Signed)
Removed surrounding edge staples -- 5 total  Placed 4x4 gauze  Pt discharged via wheelchair with NT

## 2018-10-01 NOTE — Progress Notes (Signed)
Pt IVs removed, catheter intact and telemetry removed  Discharge education completed at bedside  Pt has all belongings  Awaiting pt transportation for discharge

## 2018-10-02 ENCOUNTER — Encounter (HOSPITAL_COMMUNITY): Payer: Self-pay | Admitting: Neurosurgery

## 2018-10-18 ENCOUNTER — Inpatient Hospital Stay: Payer: BC Managed Care – PPO | Attending: Internal Medicine | Admitting: Internal Medicine

## 2018-10-18 ENCOUNTER — Other Ambulatory Visit: Payer: Self-pay

## 2018-10-18 ENCOUNTER — Ambulatory Visit: Payer: BC Managed Care – PPO | Admitting: Internal Medicine

## 2018-10-18 VITALS — BP 132/79 | HR 61 | Temp 98.9°F | Resp 17 | Ht 72.0 in | Wt 186.8 lb

## 2018-10-18 DIAGNOSIS — N400 Enlarged prostate without lower urinary tract symptoms: Secondary | ICD-10-CM | POA: Diagnosis not present

## 2018-10-18 DIAGNOSIS — Z79899 Other long term (current) drug therapy: Secondary | ICD-10-CM | POA: Diagnosis not present

## 2018-10-18 DIAGNOSIS — C719 Malignant neoplasm of brain, unspecified: Secondary | ICD-10-CM | POA: Insufficient documentation

## 2018-10-18 NOTE — Progress Notes (Signed)
[facility] [place], [place] [address] [phone]   New Patient Evaluation  Date of Service: 10/18/18 Patient Name: Cody [name] Patient MRN: [id number] Patient DOB: 1960-04-23 Provider: Ventura Sellers, MD  Identifying Statement:  Cody [name] is a 58 y.o. male with left occipital glioblastoma who presents for initial consultation and evaluation.    Referring Provider: Maury Dus, MD [place],  [place] [address]  Oncologic History: Oncology History  Glioblastoma with isocitrate dehydrogenase gene wildtype [facility])  09/29/2018 Surgery   Craniotomy, resection with Dr. Kathyrn Sheriff     Biomarkers:  MGMT Unknown.  IDH 1/2 Unknown.  EGFR Unknown  TERT Unknown   History of Present Illness: The patient's records from the referring physician were obtained and reviewed and the patient interviewed to confirm this HPI.  Waynetta Sandy presented to medical attention in August 2020 with several weeks history of new onset headaches.  Progressive nature of symptoms led to an ED admission, where CNS imaging demonstrated enhancing mass in the left occipital lobe.  He underwent craniotomy and resection with Dr. Kathyrn Sheriff on 4/82/70 without complication.  He was discharged to home with small visual field impairment and otherwise functionally intact.  He denies headaches or any seizures since surgery, having completed decadron taper.    Medications: Current Outpatient Medications on File Prior to Visit  Medication Sig Dispense Refill   dexamethasone (DECADRON) 1 MG tablet 2 tablets twice daily for 2 days, one tablet twice daily for 2 days, one tablet daily for 2 days. 15 tablet 0   finasteride (PROSCAR) 5 MG tablet Take 5 mg by mouth daily.     levETIRAcetam (KEPPRA) 500 MG tablet Take 1 tablet (500 mg total) by mouth 2 (two) times daily. 60 tablet 3   tamsulosin (FLOMAX) 0.4 MG CAPS capsule  Take 0.4 mg by mouth daily.     No current facility-administered medications on file prior to visit.     Allergies: No Known Allergies Past Medical History:  Past Medical History:  Diagnosis Date   Enlarged prostate    Past Surgical History:  Past Surgical History:  Procedure Laterality Date   APPLICATION OF CRANIAL NAVIGATION Left 09/29/2018   Procedure: APPLICATION OF CRANIAL NAVIGATION;  Surgeon: [name], MD;  Location: [facility];  Service: Neurosurgery;  Laterality: Left;   CRANIOTOMY Left 09/29/2018   Procedure: Sterotactic Left craniotomy for resection of tumor;  Surgeon: [name], MD;  Location: [facility];  Service: Neurosurgery;  Laterality: Left;  Sterotactic Left craniotomy for resection of tumor   Social History:  Social History   Socioeconomic History   Marital status: Married    Spouse name: Not on file   Number of children: Not on file   Years of education: Not on file   Highest education level: Not on file  Occupational History   Not on file  Social Needs   Financial resource strain: Not on file   Food insecurity    Worry: Not on file    Inability: Not on file   Transportation needs    Medical: Not on file    Non-medical: Not on file  Tobacco Use   Smoking status: Never Smoker   Smokeless tobacco: Never Used  Substance and Sexual Activity   Alcohol use: Never    Frequency: Never   Drug use: Never   Sexual activity: Not on file  Lifestyle   Physical activity  Days per week: Not on file    Minutes per session: Not on file   Stress: Not on file  Relationships   Social connections    Talks on phone: Not on file    Gets together: Not on file    Attends religious service: Not on file    Active member of club or organization: Not on file    Attends meetings of clubs or organizations: Not on file    Relationship status: Not on file   Intimate partner violence    Fear of current or ex partner: Not on file     Emotionally abused: Not on file    Physically abused: Not on file    Forced sexual activity: Not on file  Other Topics Concern   Not on file  Social History Narrative   Not on file   Family History: No family history on file.  Review of Systems: Constitutional: Denies fevers, chills or abnormal weight loss Eyes: Denies blurriness of vision Ears, nose, mouth, throat, and face: Denies mucositis or sore throat Respiratory: Denies cough, dyspnea or wheezes Cardiovascular: Denies palpitation, chest discomfort or lower extremity swelling Gastrointestinal:  Denies nausea, constipation, diarrhea GU: Denies dysuria or incontinence Skin: Denies abnormal skin rashes Neurological: Per HPI Musculoskeletal: Denies joint pain, back or neck discomfort. No decrease in ROM Behavioral/Psych: Denies anxiety, disturbance in thought content, and mood instability  Physical Exam: Vitals:   10/18/18 1530  BP: 132/79  Pulse: 61  Resp: 17  Temp: 98.9 F (37.2 C)  SpO2: 99%   KPS: 80. General: Alert, cooperative, pleasant, in no acute distress Head: Craniotomy scar noted, dry and intact. EENT: No conjunctival injection or scleral icterus. Oral mucosa moist Lungs: Resp effort normal Cardiac: Regular rate and rhythm Abdomen: Soft, non-distended abdomen Skin: No rashes cyanosis or petechiae. Extremities: No clubbing or edema  Neurologic Exam: Mental Status: Awake, alert, attentive to examiner. Oriented to self and environment. Language is fluent with intact comprehension.  Cranial Nerves: Visual acuity is grossly normal. Right inferior quadrantanopia. Extra-ocular movements intact. No ptosis. Face is symmetric, tongue midline. Motor: Tone and bulk are normal. Power is full in both arms and legs. Reflexes are symmetric, no pathologic reflexes present. Intact finger to nose bilaterally Sensory: Intact to light touch and temperature Gait: Normal and tandem gait is impaired.   Labs: I have  reviewed the data as listed    Component Value Date/Time   NA 139 09/30/2018 0241   K 4.9 09/30/2018 0241   CL 105 09/30/2018 0241   CO2 25 09/30/2018 0241   GLUCOSE 148 (H) 09/30/2018 0241   BUN 18 09/30/2018 0241   CREATININE 1.39 (H) 09/30/2018 0241   CALCIUM 8.6 (L) 09/30/2018 0241   GFRNONAA 57 (L) 09/30/2018 0241   GFRAA >60 09/30/2018 0241   Lab Results  Component Value Date   WBC 23.1 (H) 09/30/2018   NEUTROABS 9.1 (H) 09/27/2018   HGB 13.8 09/30/2018   HCT 39.3 09/30/2018   MCV 82.4 09/30/2018   PLT 209 09/30/2018    Imaging:  Ct Head Wo Contrast  Result Date: 09/27/2018 CLINICAL DATA: Headache.  Neoplasm, surgical planning EXAM: CT HEAD WITHOUT CONTRAST TECHNIQUE: Contiguous axial images were obtained from the base of the skull through the vertex without intravenous contrast. COMPARISON:  Earlier today, 09/27/2018 FINDINGS: Brain: Again noted is the large necrotic mass in the left occipital lobe, unchanged since prior study. No hemorrhage. Extensive surrounding vasogenic edema. Mass effect on the occipital horn of  the left lateral ventricle. No acute infarct or hydrocephalus. No hemorrhage. No acute infarct. No hydrocephalus. Vascular: No hyperdense vessel or unexpected calcification. Skull: No acute calvarial abnormality. Sinuses/Orbits: Visualized paranasal sinuses and mastoids clear. Orbital soft tissues unremarkable. Other: None IMPRESSION: Stable appearance of the necrotic mass in the left occipital lobe with extensive surrounding vasogenic edema. Electronically Signed   By: Rolm Baptise M.D.   On: 09/27/2018 21:50   Ct Head Wo Contrast  Result Date: 09/27/2018 CLINICAL DATA:  Headache and dizziness EXAM: CT HEAD WITHOUT CONTRAST TECHNIQUE: Contiguous axial images were obtained from the base of the skull through the vertex without intravenous contrast. COMPARISON:  None. FINDINGS: Brain: The ventricles appear normal in size and configuration. There is extensive  vasogenic edema throughout much of the left occipital and inferior to mid parietal lobe region. There is a somewhat cystic appearing area within this area of vasogenic edema measuring 3.2 x 2.8 cm. There is apparent edema throughout the superior left frontal and parietal lobes with relative of effacement of sulci in these areas compared to the normal-appearing right side. There is no midline shift. No subdural or epidural fluid collections. No acute hemorrhage evident. There is decreased attenuation in the left anterior inferior frontal lobe, a likely prior infarct. Vascular: No hyperdense vessel. No vascular calcifications are evident. Skull: Bony calvarium appears intact. Sinuses/Orbits: There is mucosal thickening in several ethmoid air cells. Other visualized paranasal sinuses are clear. Visualized orbits appear symmetric bilaterally. Other: Visualized mastoid air cells are clear. IMPRESSION: 1. Cystic appearing area in the left occipital lobe posteriorly with extensive vasogenic edema in this area. Suspect mass or possible abscess in this area. Advise pre and post-contrast brain MRI to further evaluate. 2. Apparent edema throughout the superior left frontal and parietal lobes with loss of sulci. No well-defined mass in this area. Again, MR pre and postcontrast may be helpful for further assessment this region, in particular to assess for potential additional masslike lesions. 3. 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 4.  No acute hemorrhage evident.  No midline shift. 5.  Mucosal thickening noted in several ethmoid air cells. These results were called by telephone at the time of interpretation on 09/27/2018 at 1:39 pm to [facility] , who verbally acknowledged these results. Electronically Signed   By: Lowella Grip III M.D.   On: 09/27/2018 13:40   Ct Chest W Contrast  Result Date: 09/27/2018 CLINICAL DATA:  Headache.  Left occipital mass. EXAM: CT CHEST, ABDOMEN, AND  PELVIS WITH CONTRAST TECHNIQUE: Multidetector CT imaging of the chest, abdomen and pelvis was performed following the standard protocol during bolus administration of intravenous contrast. CONTRAST:  14m OMNIPAQUE IOHEXOL 300 MG/ML  SOLN COMPARISON:  None. FINDINGS: CT CHEST FINDINGS Cardiovascular: Normal heart size.  No abnormal vascular finding. Mediastinum/Nodes: No mass or adenopathy. Lungs/Pleura: No pleural effusion. 3 mm subpleural nodule right upper lobe image 13. 2 mm subpleural nodule right upper lobe image 26. No significant pulmonary finding. Musculoskeletal: Normal CT ABDOMEN PELVIS FINDINGS Hepatobiliary: Normal Pancreas: Normal Spleen: Normal Adrenals/Urinary Tract: Adrenal glands are normal. Kidneys are normal. Bladder is normal. Stomach/Bowel: No abnormal bowel finding. Vascular/Lymphatic: Normal.  No atherosclerotic disease. Reproductive: Normal Other: No free fluid or air. Musculoskeletal: Normal IMPRESSION: Normal CT scan of the chest abdomen and pelvis. Electronically Signed   By: MNelson ChimesM.D.   On: 09/27/2018 21:51   Mr BJeri CosWBTContrast  Result Date: 09/30/2018 CLINICAL DATA:  Postop day  1 status post left occipital tumor resection. EXAM: MRI HEAD WITHOUT AND WITH CONTRAST TECHNIQUE: Multiplanar, multiecho pulse sequences of the brain and surrounding structures were obtained without and with intravenous contrast. CONTRAST:  8 mL Gadavist COMPARISON:  09/27/2018 FINDINGS: Brain: Sequelae of interval left occipital tumor resection are identified with blood products in the resection bed. There is small volume pneumocephalus. There is moderate residual edema in the left occipital and parietal lobes with decreased regional mass effect. The atrium of the left lateral ventricle remains partially effaced. There is small volume nodular and curvilinear enhancement along the margins of the resection cavity including a 4 mm nodular focus anterosuperiorly (series 18, image 29). There is  residual enhancing tumor anterior to the resection cavity involving the occipital horn of the left lateral ventricle with ependymal enhancement (series 18, image 27). A 2 mm satellite focus of enhancement more inferiorly in the left occipital lobe is unchanged (series 18, image 19), and there is also a 3 mm focus of enhancement anterior and lateral to the resection cavity in the occipital lobe (series 18, image 23). An extra-axial fluid collection subjacent to the craniotomy measures 8 mm in thickness. Mild dural enhancement relatively diffusely over the left cerebral convexity is likely postoperative. No sizable acute infarct is evident. Encephalomalacia is again noted anteriorly in the frontal lobes compatible with remote trauma. Vascular: Major intracranial vascular flow voids are preserved. Skull and upper cervical spine: Left occipital craniotomy with overlying scalp swelling. Sinuses/Orbits: Unremarkable orbits. Paranasal sinuses and mastoid air cells are clear. Other: None. IMPRESSION: Interval left occipital tumor resection with decreased mass effect and residual tumor as above, most notably involving the occipital horn of the left lateral ventricle. Electronically Signed   By: Logan Bores M.D.   On: 09/30/2018 09:11   Mr Jeri Cos And Wo Contrast  Result Date: 09/27/2018 CLINICAL DATA:  Headache and dizziness. Abnormal head CT with left hemisphere mass. EXAM: MRI HEAD WITHOUT AND WITH CONTRAST TECHNIQUE: Multiplanar, multiecho pulse sequences of the brain and surrounding structures were obtained without and with intravenous contrast. CONTRAST:  10 cc Gadavist COMPARISON:  Head CT same day FINDINGS: Brain: There is a background pattern of brain atrophy with chronic encephalomalacia and volume loss in the frontal lobes likely related to distant head trauma. There is a necrotic mass lesion in the left occipital lobe measuring 6 x 3 x 4 cm in size. Central portion of the mass is necrotic but does not show  restricted diffusion to suggest abscess. There is continuous enhancement around the periphery of the lesion. Lesion extends to the surface of the brain in the occipital region. Tumor extends to and involves the occipital horn of the left lateral ventricle with ependymal enhancement. There is regional vasogenic edema and mass effect. There is left-to-right midline shift of 2 mm. Tiny satellite focus of enhancement at the inferior left occipital region. No evidence of vascular ischemic disease. No hydrocephalus. No extra-axial fluid collection. Vascular: Major vessels at the base of the brain show flow. Skull and upper cervical spine: Negative Sinuses/Orbits: Clear/normal Other: None IMPRESSION: 6 x 3 x 4 cm mass in the left occipital lobe with extensive necrosis. Extension to the occipital horn of the left lateral ventricle with ependymal enhancement. Regional vasogenic edema. Small satellite enhancing focus in the inferior occipital lobe. Mass effect with left-to-right shift of 2 mm. 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 Bifrontal atrophy and gliosis likely secondary to a distant history of close head injury. Electronically Signed  By: Nelson Chimes M.D.   On: 09/27/2018 19:27   Ct Abdomen Pelvis W Contrast  Result Date: 09/27/2018 CLINICAL DATA:  Headache.  Left occipital mass. EXAM: CT CHEST, ABDOMEN, AND PELVIS WITH CONTRAST TECHNIQUE: Multidetector CT imaging of the chest, abdomen and pelvis was performed following the standard protocol during bolus administration of intravenous contrast. CONTRAST:  152m OMNIPAQUE IOHEXOL 300 MG/ML  SOLN COMPARISON:  None. FINDINGS: CT CHEST FINDINGS Cardiovascular: Normal heart size.  No abnormal vascular finding. Mediastinum/Nodes: No mass or adenopathy. Lungs/Pleura: No pleural effusion. 3 mm subpleural nodule right upper lobe image 13. 2 mm subpleural nodule right upper lobe image 26. No significant pulmonary finding. Musculoskeletal: Normal CT ABDOMEN PELVIS  FINDINGS Hepatobiliary: Normal Pancreas: Normal Spleen: Normal Adrenals/Urinary Tract: Adrenal glands are normal. Kidneys are normal. Bladder is normal. Stomach/Bowel: No abnormal bowel finding. Vascular/Lymphatic: Normal.  No atherosclerotic disease. Reproductive: Normal Other: No free fluid or air. Musculoskeletal: Normal IMPRESSION: Normal CT scan of the chest abdomen and pelvis. Electronically Signed   By: MNelson ChimesM.D.   On: 09/27/2018 21:51    Pathology:    Assessment/Plan Glioblastoma with isocitrate dehydrogenase gene wildtype ([facility] [C71.9]  We appreciate the opportunity to participate in the care of Cody [name]  He has recovered well following surgery and had no issue weaning off dexamethasone.  He has notable visual impairment and some balance and cognitive issues.   Today we had an extensive discussion regarding his pathology and recommended treatment pathways for glioblastoma.  We discussed radiation, chemotherapy, and clinical trial options in addition to more general discussion of prognosis and goals of care.    We recommended initiating treatment with 6 weeks of intensity modulated radiation therapy and concurrent temozolomide dosed at 774mm2 daily.  Consultation will be placed with radiation oncology with recommended start date ~1 month from craniotomy.  We reviewed side effects of temozolomide including fatigue, constipation, nausea, cytopenias.  Chemotherapy should be held for the following:  ANC less than 1,000  Platelets less than 100,000  LFT or creatinine greater than 2x ULN  If clinical concerns/contraindications develop  He may discontinue post-operative Keppra.  Screening for potential clinical trials was performed and discussed using eligibility criteria for active protocols [facility], as well as national database available on [email or web address]   The patient is a candidate and is interested in pursuing a  research protocol.  Referral will be placed for discussion with research team regarding [id number] Ramipril to Prevent Memory Loss in People With Glioblastoma".  We also recommended further tissue analysis with whole exome sequencing through [facility].  This will help identify molecular signature which could allow for tailored systemic therapy in the upfront or recurrent stages.  We spent twenty additional minutes teaching regarding the natural history, biology, and historical experience in the treatment of brain tumors. We then discussed in detail the current recommendations for therapy focusing on the mode of administration, mechanism of action, anticipated toxicities, and quality of life issues associated with this plan. We also provided teaching sheets for the patient to take home as an additional resource.  He will return for follow up during weeks 2, 4 and 6 of IMRT with labs for evaluation.  All questions were answered. The patient knows to call the clinic with any problems, questions or concerns. No barriers to learning were detected.  The total time spent in the encounter was 60 minutes and more than 50% was on counseling and review of test results  Ventura Sellers, MD Medical Director of Neuro-Oncology [facility] [place] 10/18/18 3:30 PM

## 2018-10-19 NOTE — Progress Notes (Signed)
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  Patient presented to the ED on 09/27/18 with symptoms of:  Severe headaches for several weeks. He also reported loss of vision/ blurred vision to his right eye on the right side.   Past or anticipated interventions, if any, per neurosurgery:  09/29/18 PROCEDURE: 1. Stereotactic left occipital craniotomy for resection of tumor 2. Use of microscope for microdissection SURGEON: Dr. [name], MD  Past or anticipated interventions, if any, per medical oncology:  10/18/18 Dr. Mickeal Skinner We recommended initiating treatment with 6 weeks of intensity modulated radiation therapy and concurrent temozolomide dosed at 75mg /m2 daily.  Consultation will be placed with radiation oncology with recommended start date ~1 month from craniotomy.  We reviewed side effects of temozolomide including fatigue, constipation, nausea, cytopenias.   He will return for follow up during weeks 2, 4 and 6 of IMRT with labs for evaluation  Dose of Decadron, if applicable: N/A  Recent neurologic symptoms, if any:   Seizures: No  Headaches: Prior to diagnosis. He is having current headaches that are very faint and don't last long. He says they are different than the original headaches he experiences.   Nausea: No  Dizziness/ataxia: No  Difficulty with hand coordination: No  Focal numbness/weakness: No  Visual deficits/changes: Right eye.   Confusion/Memory deficits: No  Painful bone metastases at present, if any: N/A  SAFETY ISSUES:  Prior radiation? No  Pacemaker/ICD? No  Possible current pregnancy? N/A  Is the patient on methotrexate? No  Additional Complaints / other details:

## 2018-10-20 ENCOUNTER — Telehealth: Payer: Self-pay

## 2018-10-20 ENCOUNTER — Telehealth: Payer: Self-pay | Admitting: Radiation Oncology

## 2018-10-20 NOTE — Telephone Encounter (Signed)
Confirmed webex appt on 9/18 and verified demographics

## 2018-10-20 NOTE — Telephone Encounter (Signed)
Introduced self as Electrical engineer to patient. Patient was referred for the [facility] [id number] Ramipril study by Dr. Mickeal Skinner. Explained to the patient the reason for the study and some of the assessments that would be required. Patient provided email so that this RN could email the informed consent and authorization forms for patient to review. Pt stated that it would be ok for this RN to call him next week to see if he had any questions or concerns. Told patient to call or email this RN if he had any questions before then. Johney Maine RN, BSN Clinical Research  10/20/18 11:50 AM

## 2018-10-21 ENCOUNTER — Other Ambulatory Visit: Payer: Self-pay

## 2018-10-21 ENCOUNTER — Ambulatory Visit
Admission: RE | Admit: 2018-10-21 | Discharge: 2018-10-21 | Disposition: A | Discharge status: Home / Self Care | Payer: BC Managed Care – PPO | Source: Ambulatory Visit | Attending: Radiation Oncology | Admitting: Radiation Oncology

## 2018-10-21 ENCOUNTER — Telehealth: Payer: Self-pay

## 2018-10-21 ENCOUNTER — Encounter: Payer: Self-pay | Admitting: Radiation Oncology

## 2018-10-21 ENCOUNTER — Other Ambulatory Visit: Payer: Self-pay | Admitting: Internal Medicine

## 2018-10-21 DIAGNOSIS — C714 Malignant neoplasm of occipital lobe: Secondary | ICD-10-CM | POA: Insufficient documentation

## 2018-10-21 DIAGNOSIS — C719 Malignant neoplasm of brain, unspecified: Secondary | ICD-10-CM

## 2018-10-21 DIAGNOSIS — C711 Malignant neoplasm of frontal lobe: Secondary | ICD-10-CM

## 2018-10-21 MED ORDER — ONDANSETRON HCL 8 MG PO TABS: 8.0000 mg | ORAL_TABLET | Freq: Two times a day (BID) | ORAL | 1 refills | Status: DC | PRN

## 2018-10-21 MED ORDER — TEMOZOLOMIDE 140 MG PO CAPS: 140.0000 mg | ORAL_CAPSULE | Freq: Every day | ORAL | 0 refills | Status: DC

## 2018-10-21 NOTE — Progress Notes (Signed)
Radiation Oncology         [phone] ________________________________  Initial outpatient Consultation by WebEx due to pandemic precautions   Name: Cody [name] MRN: [id number]  Date: 10/21/2018  DOB: 1960-11-13  KC:LEXNT, Herbie [place], MD  Mickeal Skinner Acey Lav, MD   REFERRING PHYSICIAN: Ventura Sellers, MD  DIAGNOSIS:    ICD-10-CM   1. 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 male who presented with new severe HAs, and vision changes. He noticed that his field of vision had changed.  He noticed improvement in his vision since surgery; visual field test after surgery showed showed right lower quadrant deficit persists.   No seizures since 30 years ago.  He had had a concussion in an accident 23 y ago during the recovery stage.  No focal weakness or numbness.   He went to the emergency room on not August 25 due to his symptoms.  MRI showed a left occipital lobe mass measuring 6 cm consistent with high-grade glioma.  He underwent left occipital craniotomy on August 27 with Dr. Kathyrn Sheriff.  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  On August 2018 he underwent a postoperative MRI of the brain.  This revealed interval resection with decreased mass-effect and residual tumor.  There is residual enhancement and FLAIR abnormality noted.  On September 15 he met Dr. Mickeal Skinner who recommends temozolomide.  Dose of Decadron, if applicable: N/A  Recent neurologic symptoms, if any:   Seizures: No  Headaches: Prior to diagnosis. Currently has slight headache upon waking, for for few minutes.He says they are different than the original headaches he experienced   Nausea: No  Dizziness/ataxia: No  Difficulty with hand coordination: No  Focal numbness/weakness: No  Visual deficits/changes: Right visual field as above is decreased  X-ray.  His  right chest that you could give him a link to ensure that the money goes directly towards Terry's account?  Sometimes the swing left legs divide the money among multiple candidates call electric chink confusion/Memory deficits: No  SAFETY ISSUES:  Prior radiation? No  Pacemaker/ICD? No  Possible current pregnancy? N/A  Is the patient on methotrexate? No   PREVIOUS RADIATION THERAPY: No  PAST MEDICAL HISTORY:  has a past medical history of Enlarged prostate.    PAST SURGICAL HISTORY: Past Surgical History:  Procedure Laterality Date   APPLICATION OF CRANIAL NAVIGATION Left 09/29/2018   Procedure: APPLICATION OF CRANIAL NAVIGATION;  Surgeon: [name], MD;  Location: [facility];  Service: Neurosurgery;  Laterality: Left;   CRANIOTOMY Left 09/29/2018   Procedure: Sterotactic Left craniotomy for resection of tumor;  Surgeon: [name], MD;  Location: [facility];  Service: Neurosurgery;  Laterality: Left;  Sterotactic Left craniotomy for resection of tumor    FAMILY HISTORY: family history is not on file.  SOCIAL HISTORY:  reports that he has never smoked. He has never used smokeless tobacco. He reports that he does not drink alcohol or use drugs.  ALLERGIES: Patient has no known allergies.  MEDICATIONS:  Current Outpatient Medications  Medication Sig Dispense Refill   finasteride (PROSCAR) 5 MG tablet Take 5 mg by mouth daily.     Multiple Vitamins-Minerals (MULTIVITAMIN ADULT PO) Take by mouth. Centrum     tamsulosin (FLOMAX) 0.4 MG CAPS capsule Take 0.4 mg by mouth daily.     No current facility-administered medications for  this encounter.     REVIEW OF SYSTEMS:  Notable for that above.   PHYSICAL EXAM:  vitals were not taken for this visit.   NAD, alert, oriented, gives a good history.  KPS = 90 100 - Normal; no complaints; no evidence of disease. 90   - Able to carry on normal activity; minor signs or symptoms of disease. 80   - Normal activity with  effort; some signs or symptoms of disease. 51   - Cares for self; unable to carry on normal activity or to do active work. 60   - Requires occasional assistance, but is able to care for most of his personal needs. 50   - Requires considerable assistance and frequent medical care. 63   - Disabled; requires special care and assistance. 52   - Severely disabled; hospital admission is indicated although death not imminent. 44   - Very sick; hospital admission necessary; active supportive treatment necessary. 10   - Moribund; fatal processes progressing rapidly. 0     - Dead  Karnofsky DA, Abelmann [place], Craver LS and Burchenal [facility] [phone]) The use of the nitrogen mustards in the palliative treatment of carcinoma: with particular reference to bronchogenic carcinoma Cancer 1 634-56   LABORATORY DATA:  Lab Results  Component Value Date   WBC 23.1 (H) 09/30/2018   HGB 13.8 09/30/2018   HCT 39.3 09/30/2018   MCV 82.4 09/30/2018   PLT 209 09/30/2018   CMP     Component Value Date/Time   NA 139 09/30/2018 0241   K 4.9 09/30/2018 0241   CL 105 09/30/2018 0241   CO2 25 09/30/2018 0241   GLUCOSE 148 (H) 09/30/2018 0241   BUN 18 09/30/2018 0241   CREATININE 1.39 (H) 09/30/2018 0241   CALCIUM 8.6 (L) 09/30/2018 0241   GFRNONAA 57 (L) 09/30/2018 0241   GFRAA >60 09/30/2018 0241         RADIOGRAPHY: Ct Head Wo Contrast  Result Date: 09/27/2018 CLINICAL DATA: Headache.  Neoplasm, surgical planning EXAM: CT HEAD WITHOUT CONTRAST TECHNIQUE: Contiguous axial images were obtained from the base of the skull through the vertex without intravenous contrast. COMPARISON:  Earlier today, 09/27/2018 FINDINGS: Brain: Again noted is the large necrotic mass in the left occipital lobe, unchanged since prior study. No hemorrhage. Extensive surrounding vasogenic edema. Mass effect on the occipital horn of the left lateral ventricle. No acute infarct or hydrocephalus. No hemorrhage. No acute infarct. No  hydrocephalus. Vascular: No hyperdense vessel or unexpected calcification. Skull: No acute calvarial abnormality. Sinuses/Orbits: Visualized paranasal sinuses and mastoids clear. Orbital soft tissues unremarkable. Other: None IMPRESSION: Stable appearance of the necrotic mass in the left occipital lobe with extensive surrounding vasogenic edema. Electronically Signed   By: Rolm Baptise M.D.   On: 09/27/2018 21:50   Ct Head Wo Contrast  Result Date: 09/27/2018 CLINICAL DATA:  Headache and dizziness EXAM: CT HEAD WITHOUT CONTRAST TECHNIQUE: Contiguous axial images were obtained from the base of the skull through the vertex without intravenous contrast. COMPARISON:  None. FINDINGS: Brain: The ventricles appear normal in size and configuration. There is extensive vasogenic edema throughout much of the left occipital and inferior to mid parietal lobe region. There is a somewhat cystic appearing area within this area of vasogenic edema measuring 3.2 x 2.8 cm. There is apparent edema throughout the superior left frontal and parietal lobes with relative of effacement of sulci in these areas compared to the normal-appearing right side. There is no midline shift. No subdural  or epidural fluid collections. No acute hemorrhage evident. There is decreased attenuation in the left anterior inferior frontal lobe, a likely prior infarct. Vascular: No hyperdense vessel. No vascular calcifications are evident. Skull: Bony calvarium appears intact. Sinuses/Orbits: There is mucosal thickening in several ethmoid air cells. Other visualized paranasal sinuses are clear. Visualized orbits appear symmetric bilaterally. Other: Visualized mastoid air cells are clear. IMPRESSION: 1. Cystic appearing area in the left occipital lobe posteriorly with extensive vasogenic edema in this area. Suspect mass or possible abscess in this area. Advise pre and post-contrast brain MRI to further evaluate. 2. Apparent edema throughout the superior left  frontal and parietal lobes with loss of sulci. No well-defined mass in this area. Again, MR pre and postcontrast may be helpful for further assessment this region, in particular to assess for potential additional masslike lesions. 3. 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 4.  No acute hemorrhage evident.  No midline shift. 5.  Mucosal thickening noted in several ethmoid air cells. These results were called by telephone at the time of interpretation on 09/27/2018 at 1:39 pm to [facility] , who verbally acknowledged these results. Electronically Signed   By: Lowella Grip III M.D.   On: 09/27/2018 13:40   Ct Chest W Contrast  Result Date: 09/27/2018 CLINICAL DATA:  Headache.  Left occipital mass. EXAM: CT CHEST, ABDOMEN, AND PELVIS WITH CONTRAST TECHNIQUE: Multidetector CT imaging of the chest, abdomen and pelvis was performed following the standard protocol during bolus administration of intravenous contrast. CONTRAST:  131m OMNIPAQUE IOHEXOL 300 MG/ML  SOLN COMPARISON:  None. FINDINGS: CT CHEST FINDINGS Cardiovascular: Normal heart size.  No abnormal vascular finding. Mediastinum/Nodes: No mass or adenopathy. Lungs/Pleura: No pleural effusion. 3 mm subpleural nodule right upper lobe image 13. 2 mm subpleural nodule right upper lobe image 26. No significant pulmonary finding. Musculoskeletal: Normal CT ABDOMEN PELVIS FINDINGS Hepatobiliary: Normal Pancreas: Normal Spleen: Normal Adrenals/Urinary Tract: Adrenal glands are normal. Kidneys are normal. Bladder is normal. Stomach/Bowel: No abnormal bowel finding. Vascular/Lymphatic: Normal.  No atherosclerotic disease. Reproductive: Normal Other: No free fluid or air. Musculoskeletal: Normal IMPRESSION: Normal CT scan of the chest abdomen and pelvis. Electronically Signed   By: MNelson ChimesM.D.   On: 09/27/2018 21:51   Mr BJeri CosWOFContrast  Result Date: 09/30/2018 CLINICAL DATA:  Postop day 1 status post left occipital  tumor resection. EXAM: MRI HEAD WITHOUT AND WITH CONTRAST TECHNIQUE: Multiplanar, multiecho pulse sequences of the brain and surrounding structures were obtained without and with intravenous contrast. CONTRAST:  8 mL Gadavist COMPARISON:  09/27/2018 FINDINGS: Brain: Sequelae of interval left occipital tumor resection are identified with blood products in the resection bed. There is small volume pneumocephalus. There is moderate residual edema in the left occipital and parietal lobes with decreased regional mass effect. The atrium of the left lateral ventricle remains partially effaced. There is small volume nodular and curvilinear enhancement along the margins of the resection cavity including a 4 mm nodular focus anterosuperiorly (series 18, image 29). There is residual enhancing tumor anterior to the resection cavity involving the occipital horn of the left lateral ventricle with ependymal enhancement (series 18, image 27). A 2 mm satellite focus of enhancement more inferiorly in the left occipital lobe is unchanged (series 18, image 19), and there is also a 3 mm focus of enhancement anterior and lateral to the resection cavity in the occipital lobe (series 18, image 23). An extra-axial fluid collection subjacent to the  craniotomy measures 8 mm in thickness. Mild dural enhancement relatively diffusely over the left cerebral convexity is likely postoperative. No sizable acute infarct is evident. Encephalomalacia is again noted anteriorly in the frontal lobes compatible with remote trauma. Vascular: Major intracranial vascular flow voids are preserved. Skull and upper cervical spine: Left occipital craniotomy with overlying scalp swelling. Sinuses/Orbits: Unremarkable orbits. Paranasal sinuses and mastoid air cells are clear. Other: None. IMPRESSION: Interval left occipital tumor resection with decreased mass effect and residual tumor as above, most notably involving the occipital horn of the left lateral ventricle.  Electronically Signed   By: Logan Bores M.D.   On: 09/30/2018 09:11   Mr Jeri Cos And Wo Contrast  Result Date: 09/27/2018 CLINICAL DATA:  Headache and dizziness. Abnormal head CT with left hemisphere mass. EXAM: MRI HEAD WITHOUT AND WITH CONTRAST TECHNIQUE: Multiplanar, multiecho pulse sequences of the brain and surrounding structures were obtained without and with intravenous contrast. CONTRAST:  10 cc Gadavist COMPARISON:  Head CT same day FINDINGS: Brain: There is a background pattern of brain atrophy with chronic encephalomalacia and volume loss in the frontal lobes likely related to distant head trauma. There is a necrotic mass lesion in the left occipital lobe measuring 6 x 3 x 4 cm in size. Central portion of the mass is necrotic but does not show restricted diffusion to suggest abscess. There is continuous enhancement around the periphery of the lesion. Lesion extends to the surface of the brain in the occipital region. Tumor extends to and involves the occipital horn of the left lateral ventricle with ependymal enhancement. There is regional vasogenic edema and mass effect. There is left-to-right midline shift of 2 mm. Tiny satellite focus of enhancement at the inferior left occipital region. No evidence of vascular ischemic disease. No hydrocephalus. No extra-axial fluid collection. Vascular: Major vessels at the base of the brain show flow. Skull and upper cervical spine: Negative Sinuses/Orbits: Clear/normal Other: None IMPRESSION: 6 x 3 x 4 cm mass in the left occipital lobe with extensive necrosis. Extension to the occipital horn of the left lateral ventricle with ependymal enhancement. Regional vasogenic edema. Small satellite enhancing focus in the inferior occipital lobe. Mass effect with left-to-right shift of 2 mm. 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 Bifrontal atrophy and gliosis likely secondary to a distant history of close head injury. Electronically Signed   By: Nelson Chimes M.D.    On: 09/27/2018 19:27   Ct Abdomen Pelvis W Contrast  Result Date: 09/27/2018 CLINICAL DATA:  Headache.  Left occipital mass. EXAM: CT CHEST, ABDOMEN, AND PELVIS WITH CONTRAST TECHNIQUE: Multidetector CT imaging of the chest, abdomen and pelvis was performed following the standard protocol during bolus administration of intravenous contrast. CONTRAST:  135m OMNIPAQUE IOHEXOL 300 MG/ML  SOLN COMPARISON:  None. FINDINGS: CT CHEST FINDINGS Cardiovascular: Normal heart size.  No abnormal vascular finding. Mediastinum/Nodes: No mass or adenopathy. Lungs/Pleura: No pleural effusion. 3 mm subpleural nodule right upper lobe image 13. 2 mm subpleural nodule right upper lobe image 26. No significant pulmonary finding. Musculoskeletal: Normal CT ABDOMEN PELVIS FINDINGS Hepatobiliary: Normal Pancreas: Normal Spleen: Normal Adrenals/Urinary Tract: Adrenal glands are normal. Kidneys are normal. Bladder is normal. Stomach/Bowel: No abnormal bowel finding. Vascular/Lymphatic: Normal.  No atherosclerotic disease. Reproductive: Normal Other: No free fluid or air. Musculoskeletal: Normal IMPRESSION: Normal CT scan of the chest abdomen and pelvis. Electronically Signed   By: MNelson ChimesM.D.   On: 09/27/2018 21:51      IMPRESSION/PLAN: This is a very  pleasant gentleman with history of glioblastoma of the left occipital lobe. Today, I talked to the patient about the findings and work-up thus far. We discussed the patient's diagnosis of glioblastoma and general treatment for this, highlighting the role of radiotherapy in the management. We discussed the available radiation techniques, and focused on the details of logistics and delivery.    We discussed the risks, benefits, and side effects of radiotherapy. Side effects may include but not necessarily be limited to: Skin irritation, hair loss, fatigue, taste changes, headache, cognitive changes, swelling in the brain , memory deficit, word finding difficulty; no guarantees  of treatment were given.   I recommend 6 weeks of radiotherapy.  I recommend treatment planning to take place next week.  The patient was encouraged to ask questions that I answered to the best of my ability.  He is enthusiastic about this plan.  I will ask my schedulers to call him with an appointment for CT simulation.  Anticipate concurrent temozolomide under the care of Dr. Mickeal Skinner.  This encounter was provided by telemedicine platform Webex.  The patient has given verbal consent for this type of encounter and has been advised to only accept a meeting of this type in a secure network environment. The time spent during this encounter was over 25 minutes. The attendants for this meeting include [name]  and ROBERTSON COLCLOUGH.  During the encounter, [name] was located at [facility] Radiation Oncology Department.  Waynetta Sandy was located at home.   __________________________________________   [name], MD

## 2018-10-21 NOTE — Progress Notes (Signed)
START ON PATHWAY REGIMEN - Neuro     One cycle, daily for 42 days concurrent with RT:     Temozolomide   **Always confirm dose/schedule in your pharmacy ordering system**  Patient Characteristics: Glioblastoma (Grade IV Glioma), Newly Diagnosed / Treatment Naive, Good Performance Status and/or Younger Patient, MGMT Promoter Unmethylated/Unknown Disease Classification: Glioma Disease Classification: Glioblastoma (Grade IV Glioma) Disease Status: Newly Diagnosed / Treatment Naive Performance Status: Good Performance Status and/or Younger Patient MGMT Promoter Methylation Status: Awaiting Test Results Intent of Therapy: Non-Curative / Palliative Intent, Discussed with Patient 

## 2018-10-21 NOTE — Telephone Encounter (Signed)
Oral Oncology Patient Advocate Encounter  Received notification from [place] that prior authorization for Temozolomide is required.  PA submitted on CoverMyMeds Key [id number] Status is pending  Oral Oncology Clinic will continue to follow.  [place] Patient [place] Phone [phone] Fax [phone] 10/21/2018    2:04 PM

## 2018-10-21 NOTE — Telephone Encounter (Signed)
TC from Pt inquiring about FMLA papers, Pt. Was informed that it should take 5-7 days from the time he gave paper work in Pt also inquired about a note for his job. In basket sent to [facility] and Dr. Mickeal Skinner

## 2018-10-24 ENCOUNTER — Telehealth: Payer: Self-pay | Admitting: *Deleted

## 2018-10-24 ENCOUNTER — Ambulatory Visit
Admission: RE | Admit: 2018-10-24 | Discharge: 2018-10-24 | Disposition: A | Discharge status: Home / Self Care | Payer: BC Managed Care – PPO | Source: Ambulatory Visit | Attending: Radiation Oncology | Admitting: Radiation Oncology

## 2018-10-24 ENCOUNTER — Encounter: Payer: Self-pay | Admitting: *Deleted

## 2018-10-24 ENCOUNTER — Other Ambulatory Visit: Payer: Self-pay

## 2018-10-24 DIAGNOSIS — Z510 Encounter for antineoplastic radiation therapy: Secondary | ICD-10-CM | POA: Insufficient documentation

## 2018-10-24 DIAGNOSIS — C714 Malignant neoplasm of occipital lobe: Secondary | ICD-10-CM

## 2018-10-24 NOTE — Telephone Encounter (Signed)
Faxed work Quarry manager to [employer] per request [phone]

## 2018-10-24 NOTE — Telephone Encounter (Signed)
Oral Oncology Patient Advocate Encounter  Prior Authorization for Temodar has been approved.    PA# [id number] Effective dates: 10/21/18 through 10/21/19  Oral Oncology Clinic will continue to follow.   [place] Patient [place] Phone [phone] Fax [phone] 10/24/2018    8:14 AM

## 2018-10-25 ENCOUNTER — Telehealth: Payer: Self-pay

## 2018-10-25 NOTE — Progress Notes (Signed)
Cody [name] [id number] 01/05/1961  SIMULATION AND TREATMENT PLANNING NOTE, SPECIAL TREATMENT PROCEDURE  OUTPATIENT  DIAGNOSIS:    ICD-10-CM   1. Primary cancer of occipital lobe ([place])  C71.4     NARRATIVE:  The patient was brought to the [facility].  Identity was confirmed.  All relevant records and images related to the planned course of therapy were reviewed.  The patient freely provided informed written consent to proceed with treatment after reviewing the details related to the planned course of therapy. The consent form was witnessed and verified by the simulation staff.    Then, the patient was set-up in a stable reproducible  supine position for radiation therapy. Aquaplast head mask was made for immobilization.  CT images were obtained.  Surface markings were placed.  The CT images were loaded into the planning software.    TREATMENT PLANNING NOTE: Treatment planning then occurred.  The radiation prescription was entered and confirmed.    A total of 1 medically necessary complex treatment devices were fabricated and supervised by me, in the form of Aquaplast mask.   MULTIPLE FIELDS WITH MLCs MAY DESIGNED IN DOSIMETRY for dose homogeneity.  I have requested : Intensity Modulated Radiotherapy (IMRT) is medically necessary for this case for the following reason:  Critical CNS structure avoidance - brainstem, optic chiasm, optic nerves, brain.  The patient will receive 46 Gy in 23 fractions to the left occipital region of brain, followed by a boost of 14 Gy in 7 fractions.  Special Treatment Procedure Note: The patient will be receiving chemotherapy concurrently. Chemotherapy heightens the risk of side effects. I have considered this during the patient's treatment planning process and will monitor the patient accordingly for side effects on a weekly basis. Concurrent chemotherapy increases the complexity of this patient's treatment and therefore this constitutes a  special treatment procedure.  -----------------------------------  [name], MD

## 2018-10-25 NOTE — Telephone Encounter (Signed)
DCP-001  Patient was previously emailed copies of the DCP-001 consent and authorization forms for the "Use of a Clinical Trial Screening Tool to Address Cancer Health Disparities in the [place] Program." Reviewed the consent form with patient in its entirety. The patient is aware that this involves one-time consent and collection of demographic variables with the majority of data collected from his medical record. He is aware that no patient identifiers are being reported via the screening tool. Patient agrees to participate and gave verbal consent over the phone. Patient meets eligibility and will be enrolled in the DCP-001 study. Thanked patient for his time and participation. Johney Maine RN, BSN Clinical Research  10/25/18 2:15 PM

## 2018-10-25 NOTE — Telephone Encounter (Signed)
Called patient to see if he had any questions or was interested in the WF 1801 Ramipril study. Patient said he read over the consent previously emailed to him and spoke with his wife. Patient decided he did not wish to enroll in the study. This RN thanked patient for his time and talked about DCP-001. Patient said he might possibly be interested in participating but asked that this RN call back later this afternoon. Johney Maine RN, BSN Clinical Research  10/25/18 9:28 AM

## 2018-10-26 ENCOUNTER — Ambulatory Visit: Payer: BC Managed Care – PPO | Admitting: Radiation Oncology

## 2018-10-26 ENCOUNTER — Telehealth: Payer: Self-pay | Admitting: Internal Medicine

## 2018-10-26 ENCOUNTER — Telehealth: Payer: Self-pay | Admitting: Pharmacist

## 2018-10-26 NOTE — Telephone Encounter (Signed)
Scheduled appt per 9/21 sch message - pt aware of appt date and time   

## 2018-10-26 NOTE — Telephone Encounter (Signed)
Oral Oncology Pharmacist Encounter  Received new prescription for Temodar (temozolomide) for the treatment of left occipital glioblastoma in conjunction with IMRT, planned duration until completion of radiation.  Patient recently diagnosed with glioblastoma in August 2020 and s/p left craniotomy on 09/29/18. Planned to start Temozolomide 75 mg/m2 (140 mg) PO once daily for 42 days given concurrently with IMRT. Radiation scheduled to start 11/02/18. Dose and frequency of temozolomide assessed and appropriate for indication.   Planned start date: 11/02/18  BMP and CBC from 09/30/18 assessed, patient OK for treatment initiation of temozolomide.  Noted CBC with leukocytosis, likely secondary to recent dexamethasone treatment.   Scr 1.39, slightly elevated, but CrCl ~72 mL/min, no dose adjustments necessary for temozolomide.  Current medication list in Epic reviewed, no DDIs with temozolomide identified.  Prescription has been e-scribed to the [facility] for benefits analysis and approval.  Oral Oncology Clinic will continue to follow for insurance authorization, copayment issues, initial counseling and start date.  Leron Croak, PharmD PGY2 Hematology/Oncology Pharmacy Resident 10/26/2018 8:58 AM  Oral Oncology Clinic [phone]

## 2018-10-26 NOTE — Telephone Encounter (Signed)
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  Patient will likely continue on Temodar for maintenance treatment for 6-12 cycles after completion of concomitant phase.  Counseled patient on administration, dosing, side effects, monitoring, drug-food interactions, safe handling, storage, and disposal.  Patient will take Temodar 140mg  capsules, (140 mg total daily dose), 1 capsule by mouth once daily, may take at bedtime and on an empty stomach to decrease nausea and vomiting.  Patient will take Temodar concurrent with radiation for 42 days straight.  Temodar and radiation start date: 11/02/18   Patient will take Zofran 8mg  tablet, 1 tablet by mouth 30-60 min prior to Temodar dose to help decrease N/V once starting adjuvant therapy. Prophylactic Zofran will not be used at initiation of concurrent phase, but will be initiated if nausea develops despite Temodar administration on an empty stomach and at bedtime.   Adverse effects include but are not limited to: nausea, vomiting, anorexia, GI upset, rash, drug fever, and fatigue. Rare but serious adverse effects of pneumocystis pneumonia and secondary malignancy also discussed.  PCP prophylaxis will not be initiated at this time, but may be added based on lymphocyte count in the future.  Reviewed with patient importance of keeping a medication schedule and plan for any missed doses.  Medication reconciliation performed and medication/allergy list updated.  Insurance authorization for Temodar has been obtained. Test claim at the pharmacy revealed copayment $0 for 1st fill of Temodar. This will ship from the [facility] on 9/24 to deliver to patient's home on 9/25.  Patient informed the pharmacy will reach out 5-7 days prior to needing next fill of Temodar to  coordinate continued medication acquisition to prevent break in therapy.   All questions answered.  Mr. Barranco voiced understanding and appreciation.   Patient knows to call the office with questions or concerns.  Leron Croak, PharmD PGY2 Hematology/Oncology Pharmacy Resident 10/26/2018 12:16 PM [facility] [phone]

## 2018-10-27 MED FILL — TEMOZOLOMIDE 140 MG CAPS: 140 | 28 days supply | Qty: 28 | Fill #0

## 2018-10-27 MED FILL — ONDANSETRON HCL 8 MG TABLET: 8 | 21 days supply | Qty: 18 | Fill #0

## 2018-10-27 NOTE — Telephone Encounter (Signed)
Corrected fax number for letter to employer [phone]

## 2018-10-28 ENCOUNTER — Emergency Department (HOSPITAL_COMMUNITY): Payer: BC Managed Care – PPO

## 2018-10-28 ENCOUNTER — Inpatient Hospital Stay (HOSPITAL_COMMUNITY)
Admission: EM | Admit: 2018-10-28 | Discharge: 2018-10-31 | DRG: 080 | Disposition: A | Discharge status: Home / Self Care | Payer: BC Managed Care – PPO | Attending: Internal Medicine | Admitting: Internal Medicine

## 2018-10-28 ENCOUNTER — Other Ambulatory Visit: Payer: Self-pay

## 2018-10-28 ENCOUNTER — Encounter (HOSPITAL_COMMUNITY): Payer: Self-pay | Admitting: Emergency Medicine

## 2018-10-28 DIAGNOSIS — C714 Malignant neoplasm of occipital lobe: Secondary | ICD-10-CM | POA: Diagnosis present

## 2018-10-28 DIAGNOSIS — C719 Malignant neoplasm of brain, unspecified: Secondary | ICD-10-CM

## 2018-10-28 DIAGNOSIS — G936 Cerebral edema: Principal | ICD-10-CM | POA: Diagnosis present

## 2018-10-28 DIAGNOSIS — R4182 Altered mental status, unspecified: Secondary | ICD-10-CM

## 2018-10-28 DIAGNOSIS — Z79899 Other long term (current) drug therapy: Secondary | ICD-10-CM

## 2018-10-28 DIAGNOSIS — R569 Unspecified convulsions: Secondary | ICD-10-CM | POA: Diagnosis present

## 2018-10-28 DIAGNOSIS — N183 Chronic kidney disease, stage 3 (moderate): Secondary | ICD-10-CM | POA: Diagnosis present

## 2018-10-28 DIAGNOSIS — G9341 Metabolic encephalopathy: Secondary | ICD-10-CM | POA: Diagnosis present

## 2018-10-28 DIAGNOSIS — N400 Enlarged prostate without lower urinary tract symptoms: Secondary | ICD-10-CM

## 2018-10-28 DIAGNOSIS — Z20828 Contact with and (suspected) exposure to other viral communicable diseases: Secondary | ICD-10-CM | POA: Diagnosis present

## 2018-10-28 LAB — COMPREHENSIVE METABOLIC PANEL
ALT: 18 U/L (ref 0–44)
AST: 20 U/L (ref 15–41)
Albumin: 3.9 g/dL (ref 3.5–5.0)
Alkaline Phosphatase: 99 U/L (ref 38–126)
Anion gap: 8 (ref 5–15)
BUN: 16 mg/dL (ref 6–20)
CO2: 26 mmol/L (ref 22–32)
Calcium: 8.7 mg/dL — ABNORMAL LOW (ref 8.9–10.3)
Chloride: 105 mmol/L (ref 98–111)
Creatinine, Ser: 1.4 mg/dL — ABNORMAL HIGH (ref 0.61–1.24)
GFR calc Af Amer: >60 mL/min (ref >60)
GFR calc non Af Amer: 56 mL/min — ABNORMAL LOW (ref >60)
Glucose, Bld: 113 mg/dL — ABNORMAL HIGH (ref 70–99)
Potassium: 4.2 mmol/L (ref 3.5–5.1)
Sodium: 139 mmol/L (ref 135–145)
Total Bilirubin: 0.6 mg/dL (ref 0.3–1.2)
Total Protein: 6.6 g/dL (ref 6.5–8.1)

## 2018-10-28 LAB — CBC WITH DIFFERENTIAL/PLATELET
Abs Immature Granulocytes: 0.02 10*3/uL (ref 0.00–0.07)
Basophils Absolute: 0 10*3/uL (ref 0.0–0.1)
Basophils Relative: 0 %
Eosinophils Absolute: 0.1 10*3/uL (ref 0.0–0.5)
Eosinophils Relative: 2 %
HCT: 39 % (ref 39.0–52.0)
Hemoglobin: 13.1 g/dL (ref 13.0–17.0)
Immature Granulocytes: 0 %
Lymphocytes Relative: 10 %
Lymphs Abs: 0.8 10*3/uL (ref 0.7–4.0)
MCH: 28.4 pg (ref 26.0–34.0)
MCHC: 33.6 g/dL (ref 30.0–36.0)
MCV: 84.4 fL (ref 80.0–100.0)
Monocytes Absolute: 0.5 10*3/uL (ref 0.1–1.0)
Monocytes Relative: 7 %
Neutro Abs: 6 10*3/uL (ref 1.7–7.7)
Neutrophils Relative %: 81 %
Platelets: 175 10*3/uL (ref 150–400)
RBC: 4.62 MIL/uL (ref 4.22–5.81)
RDW: 13 % (ref 11.5–15.5)
WBC: 7.4 10*3/uL (ref 4.0–10.5)
nRBC: 0 % (ref 0.0–0.2)

## 2018-10-28 LAB — AMMONIA: Ammonia: 10 umol/L (ref 9–35)

## 2018-10-28 LAB — URINALYSIS, ROUTINE W REFLEX MICROSCOPIC
Bilirubin Urine: NEGATIVE
Glucose, UA: NEGATIVE mg/dL
Hgb urine dipstick: NEGATIVE
Ketones, ur: NEGATIVE mg/dL
Leukocytes,Ua: NEGATIVE
Nitrite: NEGATIVE
Protein, ur: NEGATIVE mg/dL
Specific Gravity, Urine: 1.014 (ref 1.005–1.030)
pH: 5 (ref 5.0–8.0)

## 2018-10-28 LAB — SALICYLATE LEVEL: Salicylate Lvl: <7 mg/dL (ref 2.8–30.0)

## 2018-10-28 LAB — SARS CORONAVIRUS 2 BY RT PCR (HOSPITAL ORDER, PERFORMED IN ~~LOC~~ HOSPITAL LAB): SARS Coronavirus 2: NEGATIVE

## 2018-10-28 LAB — ACETAMINOPHEN LEVEL: Acetaminophen (Tylenol), Serum: <10 ug/mL — ABNORMAL LOW (ref 10–30)

## 2018-10-28 LAB — ETHANOL: Alcohol, Ethyl (B): <10 mg/dL (ref <10)

## 2018-10-28 IMAGING — CT CT HEAD W/O CM
3 series · 15 of 46 positions shown, 18 images · non-contrast
Comparison: MR [DATE], CT head [DATE]

CLINICAL DATA: Confusion, history of brain malignancy

EXAM:
CT HEAD WITHOUT CONTRAST
TECHNIQUE: Contiguous axial images were obtained from the base of the skull
through the vertex without intravenous contrast.

[Series 2: head wo · axial · 0.41mm/px · z∈[-91,+29]mm · 9 of 29 slices shown, 12 images]
[im 3/29  brain]
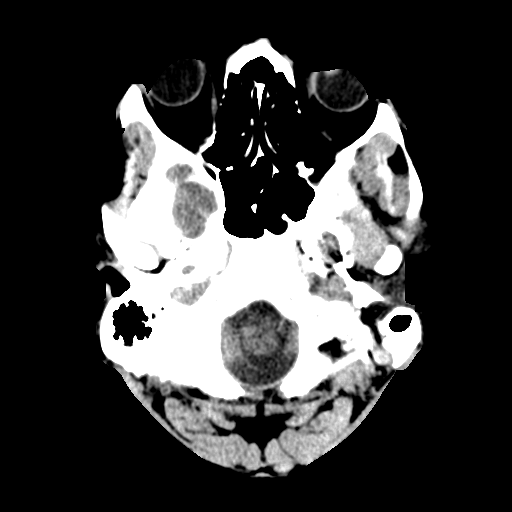
[im 3/29  bone]
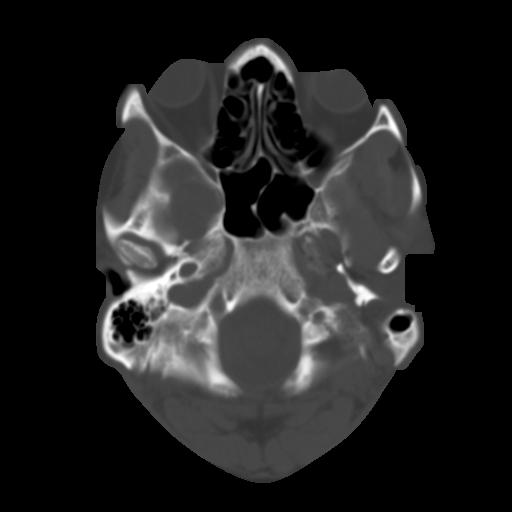
[im 6/29  brain]
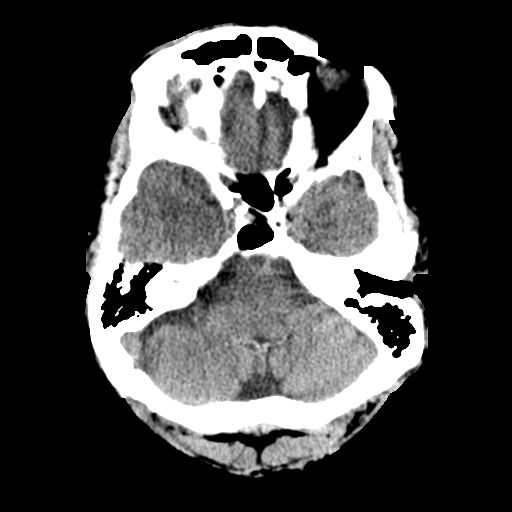
[im 9/29  brain]
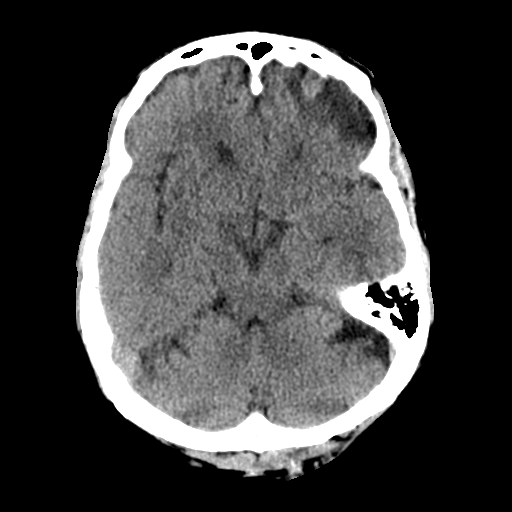
[im 12/29  brain]
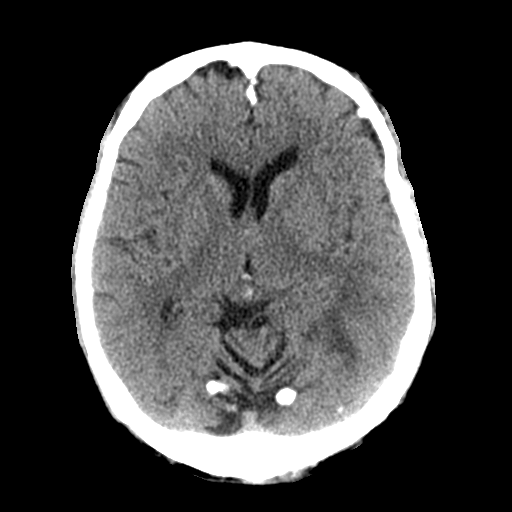
[im 15/29  brain]
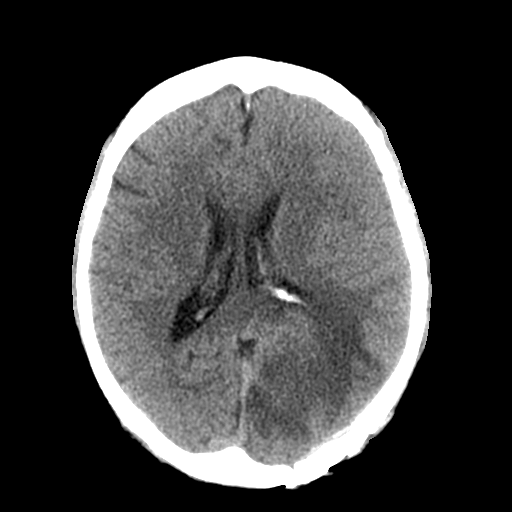
[im 15/29  bone]
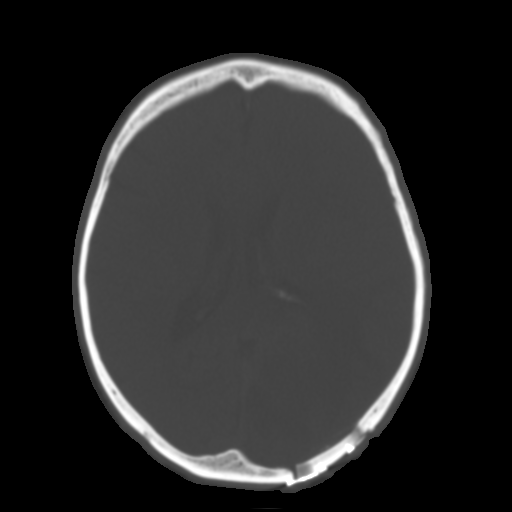
[im 18/29  brain]
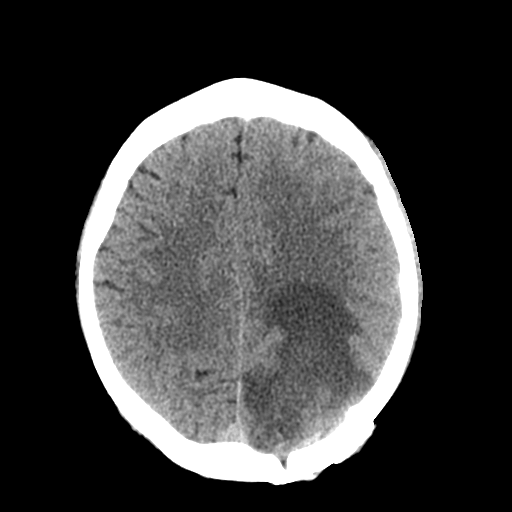
[im 21/29  brain]
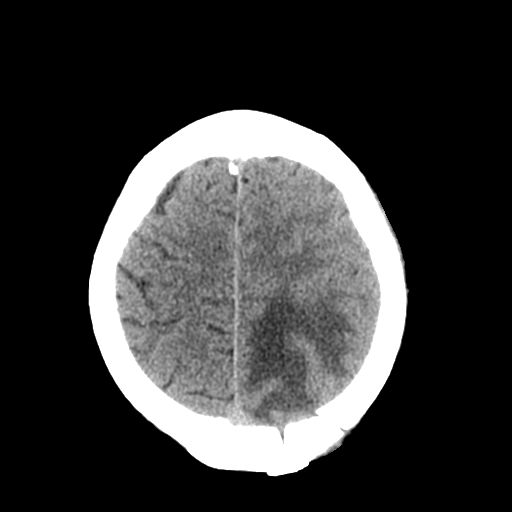
[im 24/29  brain]
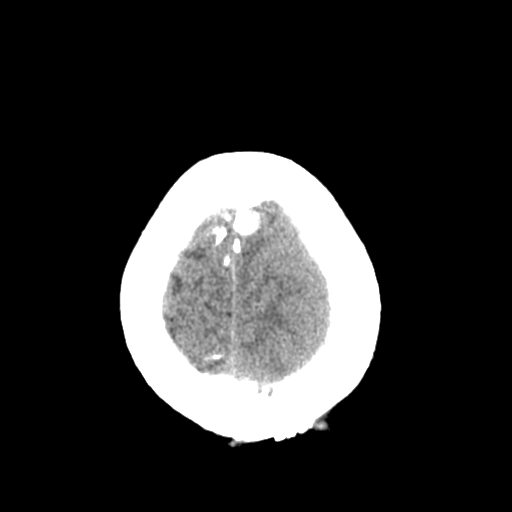
[im 27/29  brain]
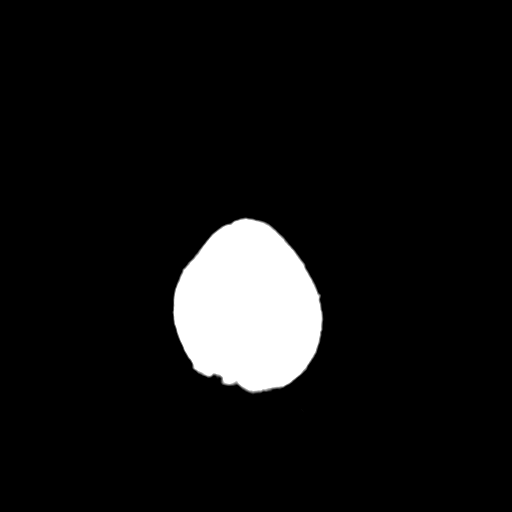
[im 27/29  bone]
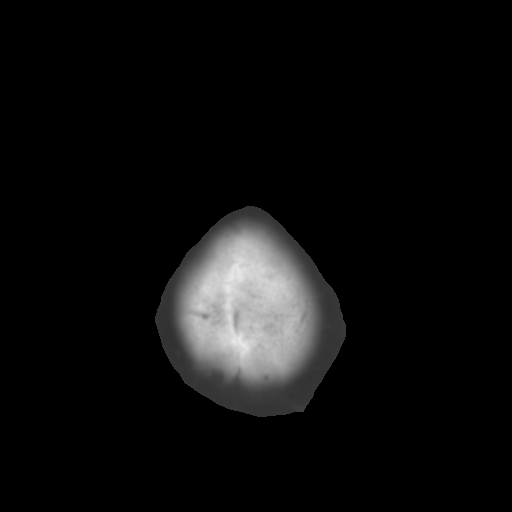

[Series 5: coronal soft tissue · coronal · 0.29mm/px · 3 of 66 slices shown]
[im 22/66  brain]
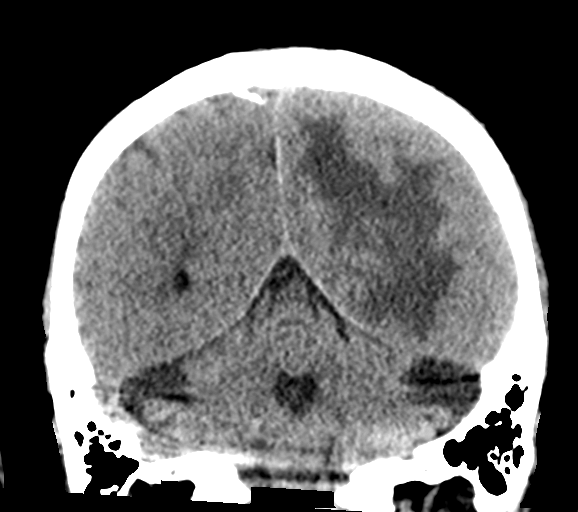
[im 29/66  brain]
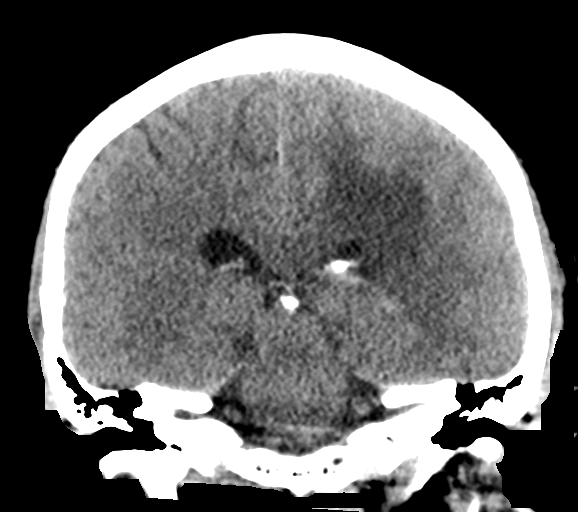
[im 37/66  brain]
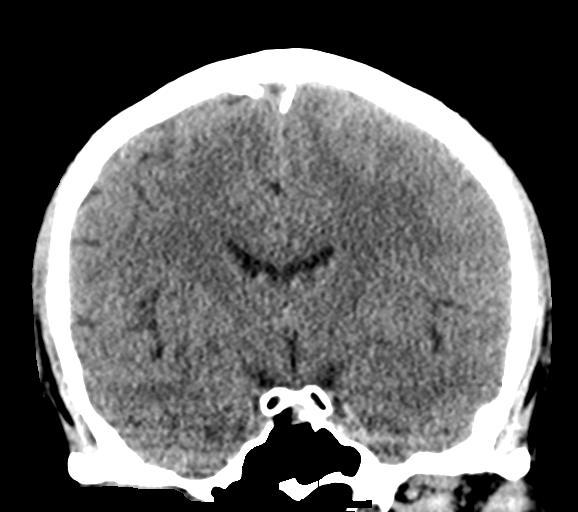

[Series 6: sagittal soft tissue · sagittal · 0.29mm/px · 3 of 56 slices shown]
[im 19/56  brain]
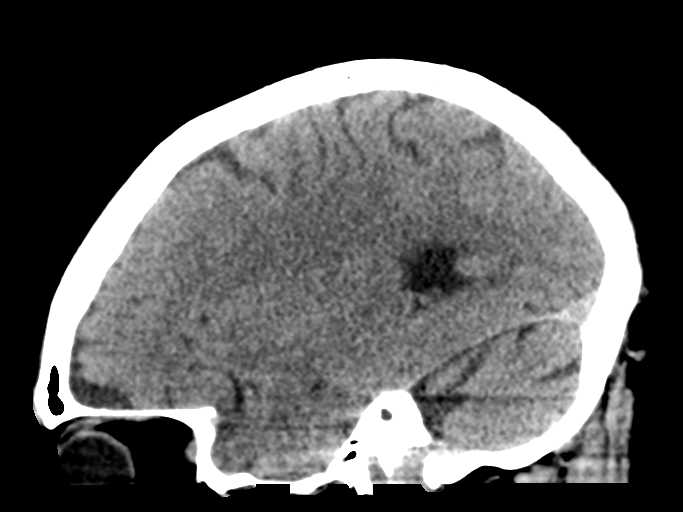
[im 28/56  brain]
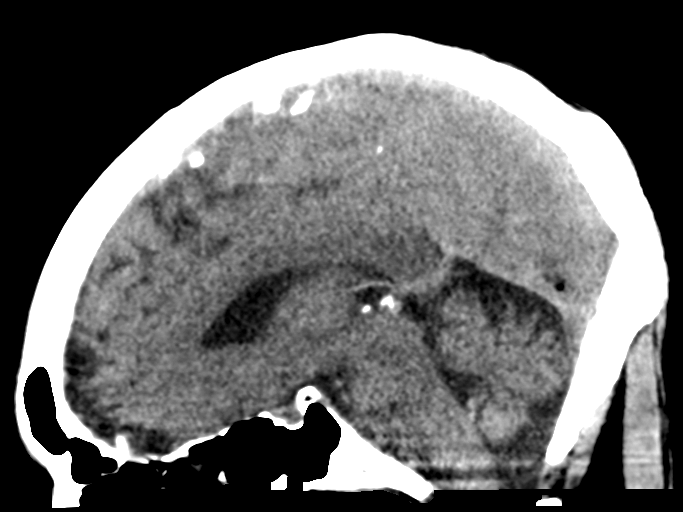
[im 37/56  brain]
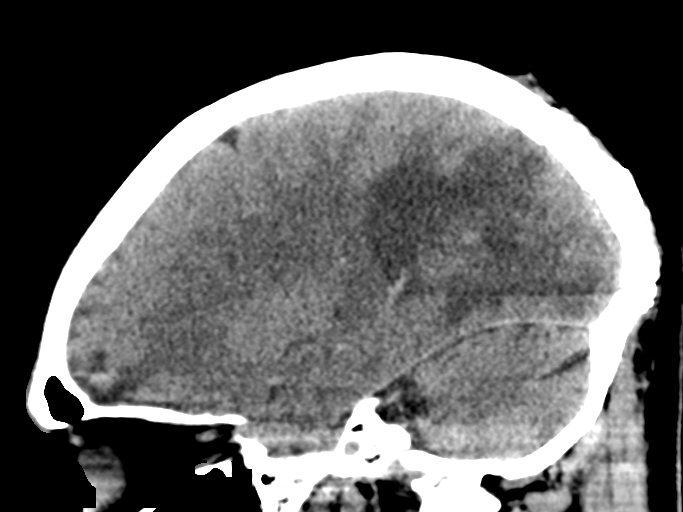

[15 of 46 positions shown; findings below may reference images not displayed]

FINDINGS: Brain: There is a region of gliosis involving the left occipital and
parietal lobes with postsurgical changes of prior craniotomy and
resection of the previously seen necrotic mass. Some residual
heterogeneity remains at the resection site, incompletely assessed
on this unenhanced CT. Mild overlying hyperattenuating dural
thickening is present as well which can be an expected finding post
resection. Mild residual effacement of the left hemispheric sulci.
No evidence of acute infarction, hemorrhage, hydrocephalus, or new
extra-axial collection.

Vascular: No hyperdense vessel or unexpected calcification.

Skull: Post craniotomy findings, as above with mild overlying soft
tissue infiltration. No large collection. No acute osseous
abnormality or suspicious osseous lesion.

Sinuses/Orbits: Paranasal sinuses and mastoid air cells are
predominantly clear. Included orbital structures are unremarkable.

Other: None
IMPRESSION: 1. Postsurgical changes of prior craniotomy and resection of the
previously seen necrotic mass in the left occipital and parietal
lobes. Some residual heterogeneity remains at the resection site,
incompletely assessed on this unenhanced CT. Consider
contrast-enhanced MRI for further evaluation and comparison to
postsurgical baseline imaging.
2. No other acute intracranial abnormality.

## 2018-10-28 IMAGING — MR MR HEAD WO/W CM
10 of 13 series · 34 of 48 positions shown · IV contrast (gadavist)
Comparison: Head CT [DATE].  MRI [DATE].

CLINICAL DATA: Altered level of consciousness. Previous brain tumor
resection.

EXAM:
MRI HEAD WITHOUT AND WITH CONTRAST
TECHNIQUE: Multiplanar, multiecho pulse sequences of the brain and surrounding
structures were obtained without and with intravenous contrast.
CONTRAST:  9mL GADAVIST GADOBUTROL 1 MMOL/ML IV SOLN

[Series 4: DWI · axial · 3.0mm · 1.09mm/px · z∈[-19,+111]mm · 6 of 90 slices shown (1 of 4)]
[im 1/90]
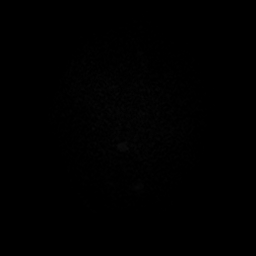
[im 18/90]
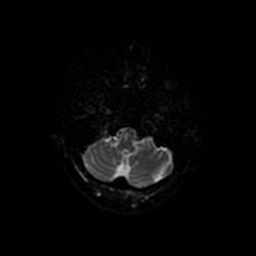
[im 36/90]
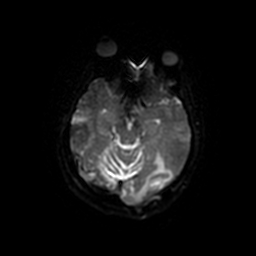
[im 54/90]
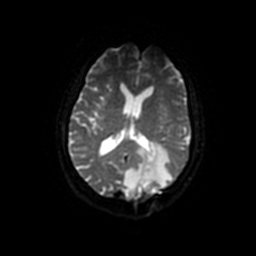
[im 72/90]
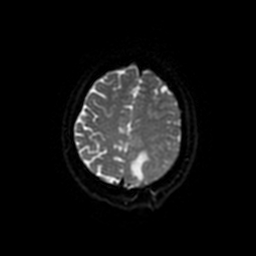
[im 90/90]
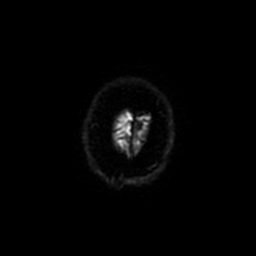

[Series 5: T2 · axial · 5.0mm · 0.86mm/px · 1 of 20 slices shown]
[im 1/20]
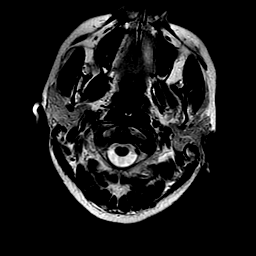

[Series 6: FLAIR · axial · 3.0mm · 0.43mm/px · z∈[-17,+119]mm · 2 of 24 slices shown]
[im 1/24]
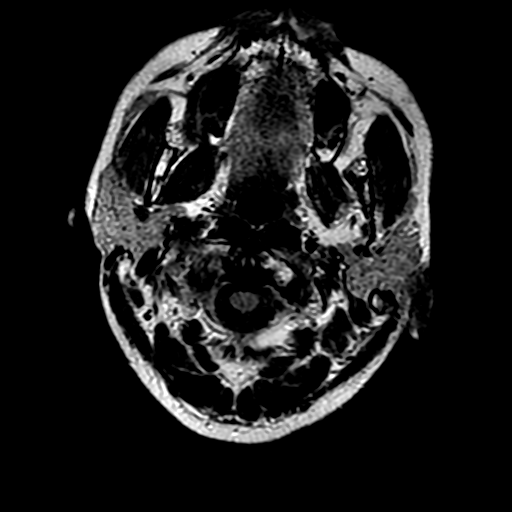
[im 24/24]
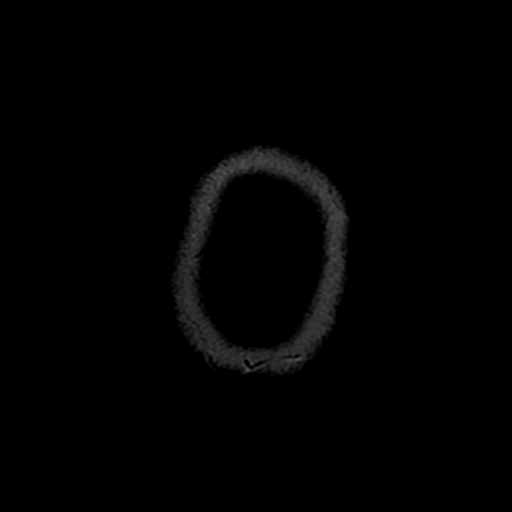

[Series 9: DWI · coronal · 4.0mm · 1.09mm/px · 6 of 90 slices shown (2 of 4)]
[im 1/90]
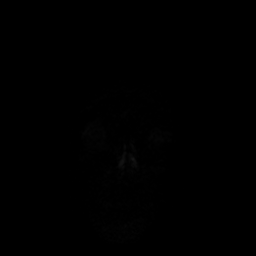
[im 18/90]
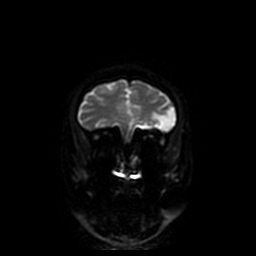
[im 36/90]
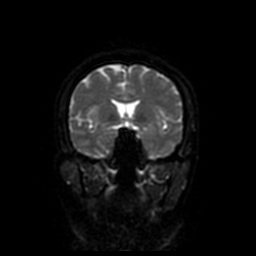
[im 54/90]
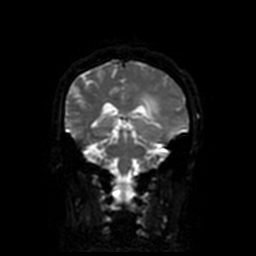
[im 72/90]
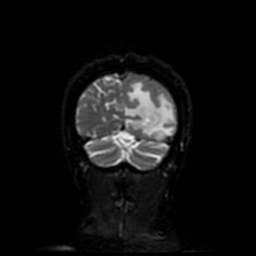
[im 90/90]
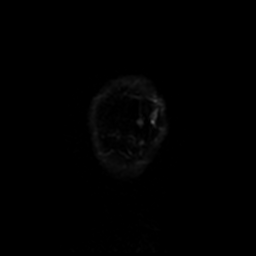

[Series 10: T2 post-contrast · coronal · 5.0mm · 0.90mm/px · 2 of 28 slices shown]
[im 1/28]
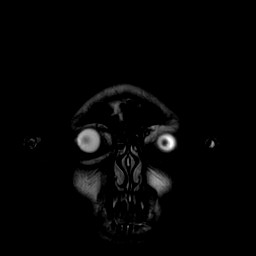
[im 28/28]
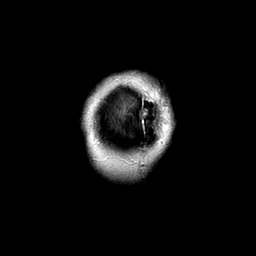

[Series 11: T1 post-contrast · axial · 1.0mm · 0.47mm/px · z∈[-13,+115]mm · 8 of 132 slices shown (1 of 3)]
[im 1/132]
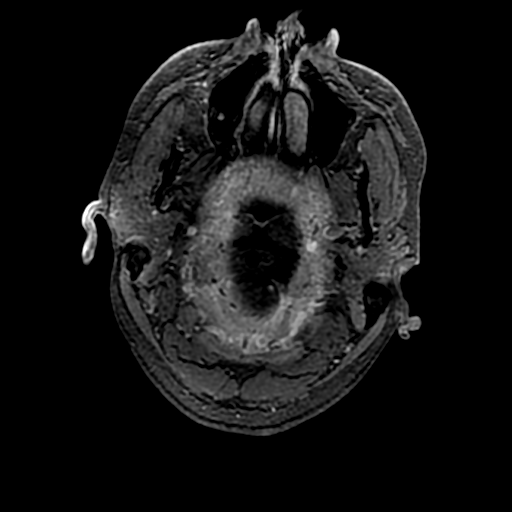
[im 17/132]
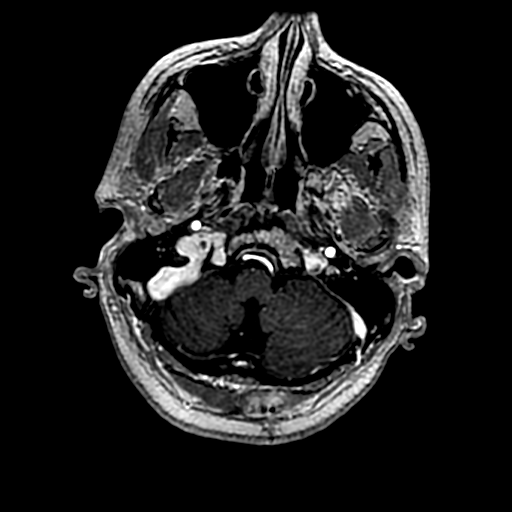
[im 33/132]
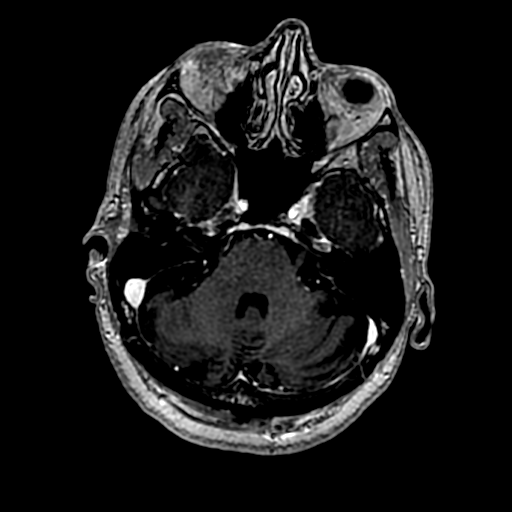
[im 50/132]
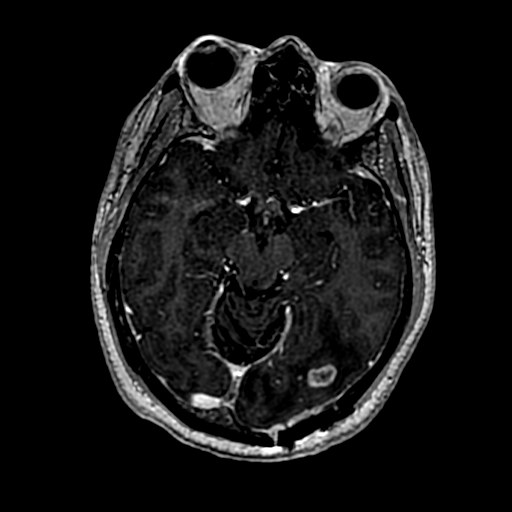
[im 82/132]
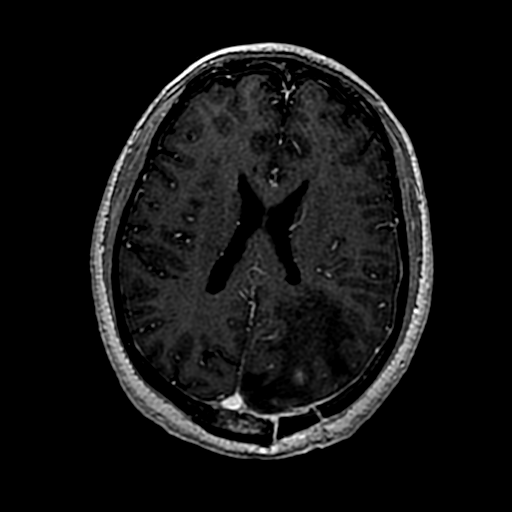
[im 99/132]
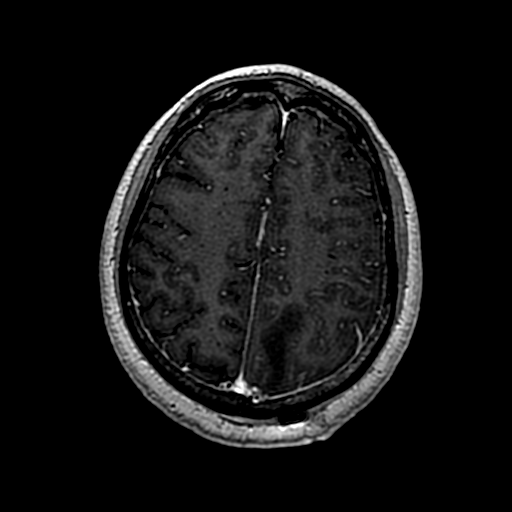
[im 115/132]
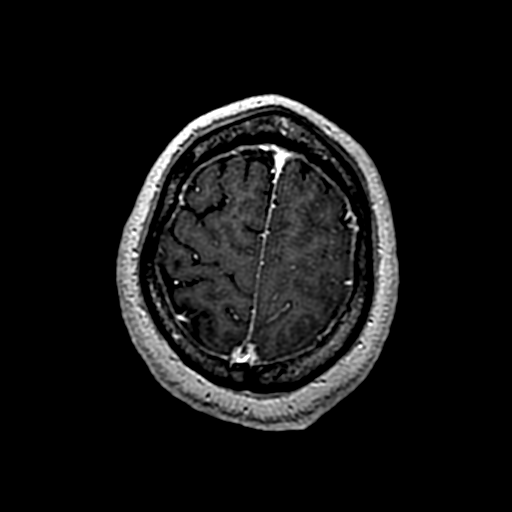
[im 132/132]
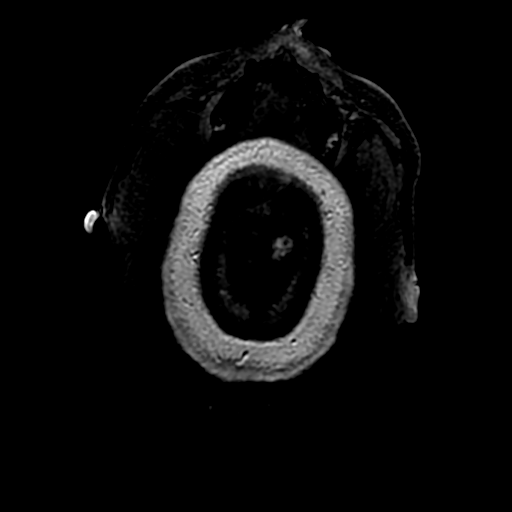

[Series 12: T1 post-contrast · coronal · 5.0mm · 0.43mm/px · 2 of 28 slices shown (2 of 3)]
[im 1/28]
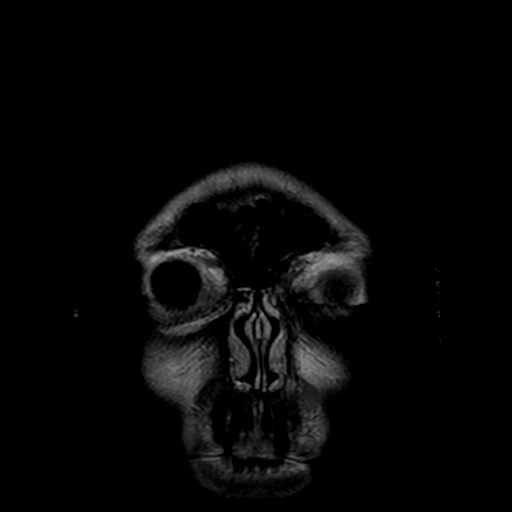
[im 28/28]
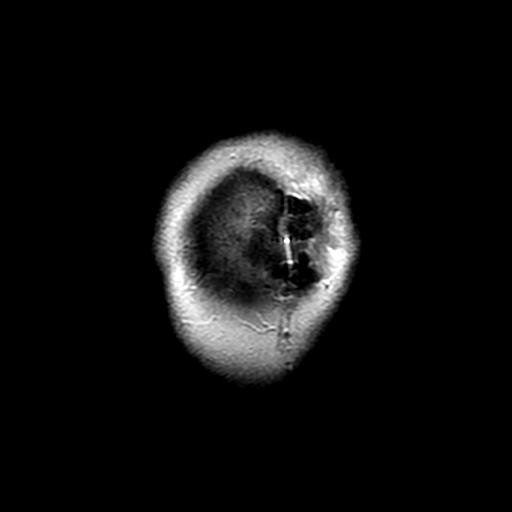

[Series 13: T1 post-contrast · sagittal · 5.0mm · 0.47mm/px · 1 of 21 slices shown (3 of 3)]
[im 1/21]
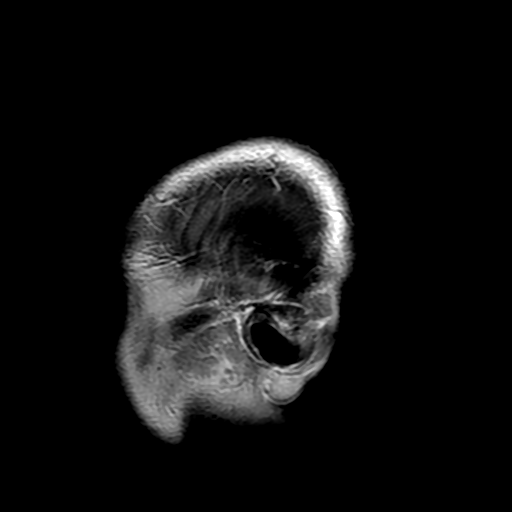

[Series 400: DWI · axial · 3.0mm · 1.09mm/px · z∈[-19,+111]mm · 3 of 45 slices shown (3 of 4)]
[im 1/45]
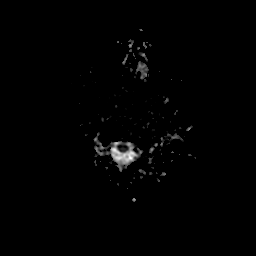
[im 23/45]
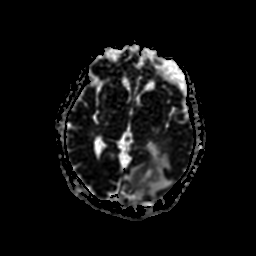
[im 45/45]
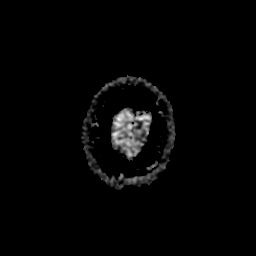

[Series 900: DWI · coronal · 4.0mm · 1.09mm/px · 3 of 45 slices shown (4 of 4)]
[im 1/45]
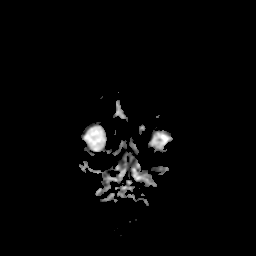
[im 23/45]
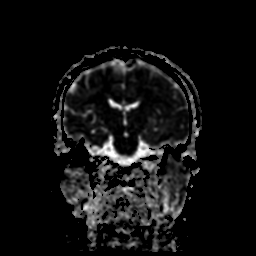
[im 45/45]
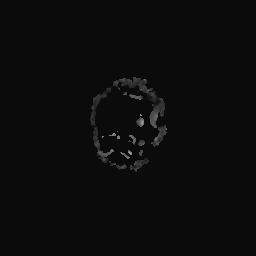

[34 of 48 positions shown; findings below may reference images not displayed]

FINDINGS: Brain: Worsened pattern with increased mass effect and edema in the
left occipital and parietal brain. Areas increasing enhancement in
the occipital and parietal region probably represent worsening of
residual tumor. Cannot rule out some component of pseudo
progression. No evidence of significant hemorrhage, hydrocephalus or
extra-axial fluid collection.

Vascular: Major vessels at the base of the brain show flow.

Skull and upper cervical spine: Otherwise negative

Sinuses/Orbits: Clear/normal

Other: None
IMPRESSION: Marked worsening of the pattern in the left occipital and posterior
parietal region, most consistent with progression of the underlying
malignancy with increase in the amount of enhancing mass and
increase in regional edema. There could be some element of
pseudoprogression, but I think the majority represents true
progression.

## 2018-10-28 MED ORDER — LORAZEPAM 2 MG/ML IJ SOLN
1.0000 mg | Freq: Once | INTRAMUSCULAR | Status: AC
  Administered 2018-10-28: 1 mg via INTRAVENOUS
  Filled 2018-10-28: qty 1

## 2018-10-28 MED ORDER — LEVETIRACETAM IN NACL 1000 MG/100ML IV SOLN
1000.0000 mg | Freq: Once | INTRAVENOUS | Status: AC
  Administered 2018-10-28: 23:00:00 1000 mg via INTRAVENOUS
  Filled 2018-10-28: qty 100

## 2018-10-28 MED ORDER — SODIUM CHLORIDE 0.9 % IV BOLUS
1000.0000 mL | Freq: Once | INTRAVENOUS | Status: AC
  Administered 2018-10-28: 20:00:00 1000 mL via INTRAVENOUS

## 2018-10-28 MED ORDER — GADOBUTROL 1 MMOL/ML IV SOLN
9.0000 mL | Freq: Once | INTRAVENOUS | Status: AC | PRN
  Administered 2018-10-28: 22:00:00 9 mL via INTRAVENOUS

## 2018-10-28 MED ORDER — DEXAMETHASONE SODIUM PHOSPHATE 10 MG/ML IJ SOLN
10.0000 mg | Freq: Once | INTRAMUSCULAR | Status: AC
  Administered 2018-10-28: 22:00:00 10 mg via INTRAVENOUS
  Filled 2018-10-28: qty 1

## 2018-10-28 NOTE — ED Notes (Signed)
Patient transported to CT 

## 2018-10-28 NOTE — H&P (Addendum)
History and Physical    RENTON HANDRICK [id number] DOB: 24-Jan-1961 DOA: 10/28/2018  PCP: Maury Dus, MD  Patient coming from: home   Chief Complaint: confusion  HPI: Cody [name] is a 58 y.o. male with medical history significant for glioblastoma, presents with above.  Diagnosed recently, s/p surgical resection in August. Plan to soon start chemotherapy and neoadjuvant radiation, wife says hasn't started either yet. She says recovery from resection has been going well. Weaned off steroids, off seizure ppx. Cognitive status good. Then, this morning, relatively acutely, patient's mental status deteriorated. Started saying things that didn't make sense. Seemed confused. No seizure-like activity, no falls. No focal weakness. No dysphagia.   ED Course: ct, mri, decadron, neurology consult   Review of Systems: As per HPI otherwise 10 point review of systems negative.    Past Medical History:  Diagnosis Date  . Enlarged prostate     Past Surgical History:  Procedure Laterality Date  . APPLICATION OF CRANIAL NAVIGATION Left 09/29/2018   Procedure: APPLICATION OF CRANIAL NAVIGATION;  Surgeon: [name], MD;  Location: [facility];  Service: Neurosurgery;  Laterality: Left;  . CRANIOTOMY Left 09/29/2018   Procedure: Sterotactic Left craniotomy for resection of tumor;  Surgeon: [name], MD;  Location: [place];  Service: Neurosurgery;  Laterality: Left;  Sterotactic Left craniotomy for resection of tumor     reports that he has never smoked. He has never used smokeless tobacco. He reports that he does not drink alcohol or use drugs.  No Known Allergies  History reviewed. No pertinent family history.  Prior to Admission medications   Medication Sig Start Date End Date Taking? Authorizing Provider  finasteride (PROSCAR) 5 MG tablet Take 5 mg by mouth daily.   Yes [provider]  ondansetron (ZOFRAN) 8 MG tablet Take 1 tablet (8 mg total) by mouth 2  (two) times daily as needed (nausea and vomiting). May take 30-60 minutes prior to Temodar administration if nausea/vomiting occurs. 10/21/18  Yes Vaslow, Acey Lav, MD  temozolomide (TEMODAR) 140 MG capsule Take 1 capsule (140 mg total) by mouth daily. May take on an empty stomach to decrease nausea & vomiting. 10/21/18  Yes Ventura Sellers, MD    Physical Exam: Vitals:   10/28/18 2100 10/28/18 2241 10/28/18 2300 10/28/18 2330  BP: 128/75 128/71 131/74 131/74  Pulse: 84 72 (!) 57 (!) 58  Resp: (!) 25 20 20 18   Temp:      TempSrc:      SpO2: 96% 100% 100% 100%  Weight:      Height:        Constitutional: No acute distress Head; healing scar left parietal region Eyes: Conjunctiva clear ENM: Moist mucous membranes. Normal dentition.  Neck: Supple Respiratory: Clear to auscultation bilaterally, no wheezing/rales/rhonchi. Normal respiratory effort. No accessory muscle use. . Cardiovascular: Regular rate and rhythm. No murmurs/rubs/gallops. Abdomen: Non-tender, non-distended. No masses. No rebound or guarding. Positive bowel sounds. Musculoskeletal: No joint deformity upper and lower extremities. Normal ROM, no contractures. Normal muscle tone.  Skin: No rashes, lesions, or ulcers.  Extremities: No peripheral edema. Palpable peripheral pulses. Neurologic: Alert, moving all 4 extremities. 5/5 upper/lower strength. Confused when answering many questions    Labs on Admission: I have personally reviewed following labs and imaging studies  CBC: Recent Labs  Lab 10/28/18 2004  WBC 7.4  NEUTROABS 6.0  HGB 13.1  HCT 39.0  MCV 84.4  PLT 0000000   Basic Metabolic Panel: Recent Labs  Lab 10/28/18  2004  NA 139  K 4.2  CL 105  CO2 26  GLUCOSE 113*  BUN 16  CREATININE 1.40*  CALCIUM 8.7*   GFR: Estimated Creatinine Clearance: 65.4 mL/min (A) (by C-G formula based on SCr of 1.4 mg/dL (H)). Liver Function Tests: Recent Labs  Lab 10/28/18 2004  AST 20  ALT 18  ALKPHOS 99   BILITOT 0.6  PROT 6.6  ALBUMIN 3.9   No results for input(s): LIPASE, AMYLASE in the last 168 hours. Recent Labs  Lab 10/28/18 2004  AMMONIA 10   Coagulation Profile: No results for input(s): INR, PROTIME in the last 168 hours. Cardiac Enzymes: No results for input(s): CKTOTAL, CKMB, CKMBINDEX, TROPONINI in the last 168 hours. BNP (last 3 results) No results for input(s): PROBNP in the last 8760 hours. HbA1C: No results for input(s): HGBA1C in the last 72 hours. CBG: No results for input(s): GLUCAP in the last 168 hours. Lipid Profile: No results for input(s): CHOL, HDL, LDLCALC, TRIG, CHOLHDL, LDLDIRECT in the last 72 hours. Thyroid Function Tests: No results for input(s): TSH, T4TOTAL, FREET4, T3FREE, THYROIDAB in the last 72 hours. Anemia Panel: No results for input(s): VITAMINB12, FOLATE, FERRITIN, TIBC, IRON, RETICCTPCT in the last 72 hours. Urine analysis:    Component Value Date/Time   COLORURINE YELLOW 10/28/2018 1904   APPEARANCEUR CLEAR 10/28/2018 1904   LABSPEC 1.014 10/28/2018 1904   PHURINE 5.0 10/28/2018 1904   GLUCOSEU NEGATIVE 10/28/2018 1904   HGBUR NEGATIVE 10/28/2018 1904   BILIRUBINUR NEGATIVE 10/28/2018 1904   KETONESUR NEGATIVE 10/28/2018 1904   PROTEINUR NEGATIVE 10/28/2018 1904   NITRITE NEGATIVE 10/28/2018 1904   LEUKOCYTESUR NEGATIVE 10/28/2018 1904    Radiological Exams on Admission: Ct Head Wo Contrast  Result Date: 10/28/2018 CLINICAL DATA:  Confusion, history of brain malignancy EXAM: CT HEAD WITHOUT CONTRAST TECHNIQUE: Contiguous axial images were obtained from the base of the skull through the vertex without intravenous contrast. COMPARISON:  MR September 30, 2018, CT head September 27, 2018 FINDINGS: Brain: There is a region of gliosis involving the left occipital and parietal lobes with postsurgical changes of prior craniotomy and resection of the previously seen necrotic mass. Some residual heterogeneity remains at the resection site,  incompletely assessed on this unenhanced CT. Mild overlying hyperattenuating dural thickening is present as well which can be an expected finding post resection. Mild residual effacement of the left hemispheric sulci. No evidence of acute infarction, hemorrhage, hydrocephalus, or new extra-axial collection. Vascular: No hyperdense vessel or unexpected calcification. Skull: Post craniotomy findings, as above with mild overlying soft tissue infiltration. No large collection. No acute osseous abnormality or suspicious osseous lesion. Sinuses/Orbits: Paranasal sinuses and mastoid air cells are predominantly clear. Included orbital structures are unremarkable. Other: None IMPRESSION: 1. Postsurgical changes of prior craniotomy and resection of the previously seen necrotic mass in the left occipital and parietal lobes. Some residual heterogeneity remains at the resection site, incompletely assessed on this unenhanced CT. Consider contrast-enhanced MRI for further evaluation and comparison to postsurgical baseline imaging. 2. No other acute intracranial abnormality. Electronically Signed   By: Lovena Le M.D.   On: 10/28/2018 19:59   Mr Jeri Cos And Wo Contrast  Result Date: 10/28/2018 CLINICAL DATA:  Altered level of consciousness. Previous brain tumor resection. EXAM: MRI HEAD WITHOUT AND WITH CONTRAST TECHNIQUE: Multiplanar, multiecho pulse sequences of the brain and surrounding structures were obtained without and with intravenous contrast. CONTRAST:  19mL GADAVIST GADOBUTROL 1 MMOL/ML IV SOLN COMPARISON:  Head CT 10/28/2018.  MRI 09/30/2018. FINDINGS: Brain: Worsened pattern with increased mass effect and edema in the left occipital and parietal brain. Areas increasing enhancement in the occipital and parietal region probably represent worsening of residual tumor. Cannot rule out some component of pseudo progression. No evidence of significant hemorrhage, hydrocephalus or extra-axial fluid collection. Vascular:  Major vessels at the base of the brain show flow. Skull and upper cervical spine: Otherwise negative Sinuses/Orbits: Clear/normal Other: None IMPRESSION: Marked worsening of the pattern in the left occipital and posterior parietal region, most consistent with progression of the underlying malignancy with increase in the amount of enhancing mass and increase in regional edema. There could be some element of pseudoprogression, but I think the majority represents true progression. Electronically Signed   By: Nelson Chimes M.D.   On: 10/28/2018 22:37   Dg Chest Port 1 View  Result Date: 10/28/2018 CLINICAL DATA:  Confusion and altered mental status. EXAM: PORTABLE CHEST 1 VIEW COMPARISON:  Chest CT 09/27/2018 FINDINGS: Low lung volumes.The cardiomediastinal contours are normal. Minor basilar atelectasis. Pulmonary vasculature is normal. No consolidation, pleural effusion, or pneumothorax. No acute osseous abnormalities are seen. IMPRESSION: Low lung volumes with mild bibasilar atelectasis. Electronically Signed   By: Keith Rake M.D.   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 Today with clinical worsening. MRI shows likely tumor progression. 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 Cr 1.4 today, from 1.28 and 1.39 recently - ctm  DVT prophylaxis: lovenox  Code Status: full  Family Communication: wife   Disposition Plan: tbd  Consults called: neurology arora  Admission status: med/surg    Desma Maxim MD [facility] Pager [phone]  If 7PM-7AM, please contact night-coverage [email or web address] Password [email or web address]  10/29/2018, 12:02 AM

## 2018-10-28 NOTE — ED Notes (Signed)
Patient transported to MRI 

## 2018-10-28 NOTE — ED Provider Notes (Signed)
Assumed care from Dr. Darl Householder at shift change.  See prior notes for full H&P.  Briefly, 58 y.o. M with history of glioblastoma here with AMS and confusion.  Patient confused here, continues repeating birthday and not really providing a lot of useful information.  He did have tumor resection about 1 month ago and is following with oncology.  Not currently on chemotherapy. Labs and CT head thus far overall reassuring.  Plan:  MRI brain pending, discuss with neuro again when resulted.  Given decadron at their recommendation.  Will need admission, whether to [facility] or [place] will depend on results.  Results for orders placed or performed during the hospital encounter of 10/28/18  SARS Coronavirus 2 [facility] order, Performed in [facility] hospital lab) Nasopharyngeal Nasopharyngeal Swab   Specimen: Nasopharyngeal Swab  Result Value Ref Range   SARS Coronavirus 2 NEGATIVE NEGATIVE  CBC with Differential/Platelet  Result Value Ref Range   WBC 7.4 4.0 - 10.5 K/uL   RBC 4.62 4.22 - 5.81 MIL/uL   Hemoglobin 13.1 13.0 - 17.0 g/dL   HCT 39.0 39.0 - 52.0 %   MCV 84.4 80.0 - 100.0 fL   MCH 28.4 26.0 - 34.0 pg   MCHC 33.6 30.0 - 36.0 g/dL   RDW 13.0 11.5 - 15.5 %   Platelets 175 150 - 400 K/uL   nRBC 0.0 0.0 - 0.2 %   Neutrophils Relative % 81 %   Neutro Abs 6.0 1.7 - 7.7 K/uL   Lymphocytes Relative 10 %   Lymphs Abs 0.8 0.7 - 4.0 K/uL   Monocytes Relative 7 %   Monocytes Absolute 0.5 0.1 - 1.0 K/uL   Eosinophils Relative 2 %   Eosinophils Absolute 0.1 0.0 - 0.5 K/uL   Basophils Relative 0 %   Basophils Absolute 0.0 0.0 - 0.1 K/uL   Immature Granulocytes 0 %   Abs Immature Granulocytes 0.02 0.00 - 0.07 K/uL  Comprehensive metabolic panel  Result Value Ref Range   Sodium 139 135 - 145 mmol/L   Potassium 4.2 3.5 - 5.1 mmol/L   Chloride 105 98 - 111 mmol/L   CO2 26 22 - 32 mmol/L   Glucose, Bld 113 (H) 70 - 99 mg/dL   BUN 16 6 - 20 mg/dL   Creatinine, Ser 1.40 (H) 0.61 - 1.24 mg/dL   Calcium 8.7  (L) 8.9 - 10.3 mg/dL   Total Protein 6.6 6.5 - 8.1 g/dL   Albumin 3.9 3.5 - 5.0 g/dL   AST 20 15 - 41 U/L   ALT 18 0 - 44 U/L   Alkaline Phosphatase 99 38 - 126 U/L   Total Bilirubin 0.6 0.3 - 1.2 mg/dL   GFR calc non Af Amer 56 (L) >60 mL/min   GFR calc Af Amer >60 >60 mL/min   Anion gap 8 5 - 15  Ethanol  Result Value Ref Range   Alcohol, Ethyl (B) Q000111Q Q000111Q mg/dL  Salicylate level  Result Value Ref Range   Salicylate Lvl Q000111Q 2.8 - 30.0 mg/dL  Acetaminophen level  Result Value Ref Range   Acetaminophen (Tylenol), Serum <10 (L) 10 - 30 ug/mL  Ammonia  Result Value Ref Range   Ammonia 10 9 - 35 umol/L   Ct Head Wo Contrast  Result Date: 10/28/2018 CLINICAL DATA:  Confusion, history of brain malignancy EXAM: CT HEAD WITHOUT CONTRAST TECHNIQUE: Contiguous axial images were obtained from the base of the skull through the vertex without intravenous contrast. COMPARISON:  MR September 30, 2018, CT head September 27, 2018 FINDINGS: Brain: There is a region of gliosis involving the left occipital and parietal lobes with postsurgical changes of prior craniotomy and resection of the previously seen necrotic mass. Some residual heterogeneity remains at the resection site, incompletely assessed on this unenhanced CT. Mild overlying hyperattenuating dural thickening is present as well which can be an expected finding post resection. Mild residual effacement of the left hemispheric sulci. No evidence of acute infarction, hemorrhage, hydrocephalus, or new extra-axial collection. Vascular: No hyperdense vessel or unexpected calcification. Skull: Post craniotomy findings, as above with mild overlying soft tissue infiltration. No large collection. No acute osseous abnormality or suspicious osseous lesion. Sinuses/Orbits: Paranasal sinuses and mastoid air cells are predominantly clear. Included orbital structures are unremarkable. Other: None IMPRESSION: 1. Postsurgical changes of prior craniotomy and resection of  the previously seen necrotic mass in the left occipital and parietal lobes. Some residual heterogeneity remains at the resection site, incompletely assessed on this unenhanced CT. Consider contrast-enhanced MRI for further evaluation and comparison to postsurgical baseline imaging. 2. No other acute intracranial abnormality. Electronically Signed   By: Lovena Le M.D.   On: 10/28/2018 19:59   Mr Jeri Cos And Wo Contrast  Result Date: 10/28/2018 CLINICAL DATA:  Altered level of consciousness. Previous brain tumor resection. EXAM: MRI HEAD WITHOUT AND WITH CONTRAST TECHNIQUE: Multiplanar, multiecho pulse sequences of the brain and surrounding structures were obtained without and with intravenous contrast. CONTRAST:  69mL GADAVIST GADOBUTROL 1 MMOL/ML IV SOLN COMPARISON:  Head CT 10/28/2018.  MRI 09/30/2018. FINDINGS: Brain: Worsened pattern with increased mass effect and edema in the left occipital and parietal brain. Areas increasing enhancement in the occipital and parietal region probably represent worsening of residual tumor. Cannot rule out some component of pseudo progression. No evidence of significant hemorrhage, hydrocephalus or extra-axial fluid collection. Vascular: Major vessels at the base of the brain show flow. Skull and upper cervical spine: Otherwise negative Sinuses/Orbits: Clear/normal Other: None IMPRESSION: Marked worsening of the pattern in the left occipital and posterior parietal region, most consistent with progression of the underlying malignancy with increase in the amount of enhancing mass and increase in regional edema. There could be some element of pseudoprogression, but I think the majority represents true progression. Electronically Signed   By: Nelson Chimes M.D.   On: 10/28/2018 22:37    Dg Chest Port 1 View  Result Date: 10/28/2018 CLINICAL DATA:  Confusion and altered mental status. EXAM: PORTABLE CHEST 1 VIEW COMPARISON:  Chest CT 09/27/2018 FINDINGS: Low lung volumes.The  cardiomediastinal contours are normal. Minor basilar atelectasis. Pulmonary vasculature is normal. No consolidation, pleural effusion, or pneumothorax. No acute osseous abnormalities are seen. IMPRESSION: Low lung volumes with mild bibasilar atelectasis. Electronically Signed   By: Keith Rake M.D.   On: 10/28/2018 19:33   10:54 PM MRI with marked worsening of left occipital and posterior parietal margins of his malignancy, appears to have some regional edema as well.  Patient still seems confused here, he is somewhat more conversant than earlier, however still not aware that he is at the hospital and other things.  Dr. Carloyn Jaeger is aware of patient and MRI findings, recommend starting keppra with concern of seizures.  Will admit to [facility] for close neuro monitoring, EEG, scheduled steroids, etc.  Discussed with Dr. Si Raider-- will admit to [facility].     Larene Pickett, PA-C 10/29/18 0335    Lucrezia Starch, MD 10/29/18 [phone]

## 2018-10-28 NOTE — ED Notes (Signed)
Patient able to follow commands, but is only oriented to self. When asking orientation questions, patient just answers "ummm" or "hmmm" to questions with exception to name and birth date. Will continue to monitor patient.

## 2018-10-28 NOTE — ED Triage Notes (Signed)
Patient arrived by [facility] from home. Pt c/o confusion.   Pt was diagnosed with brain cancer a few months ago per EMS.

## 2018-10-28 NOTE — ED Provider Notes (Signed)
[place] DEPT Provider Note   CSN: [id number] Arrival date & time: 10/28/18  1833     History   Chief Complaint Chief Complaint  Patient presents with  . Altered Mental Status    HPI Cody [name] is a 58 y.o. male hx of glioblastoma here with confusion.  Patient apparently woke up this morning and is slightly confused compared to baseline.  Wife left for work and got a call from him around 6 PM .  He apparently called for help and asked her to come home .  She came home at that time and found him very confused and not making any sense.  Patient unable to give me any history and keeps on repeating his birthday.  Patient had a recent craniotomy for glioblastoma.  He also follows up with oncology. No reported fevers.      The history is provided by the spouse. The history is limited by the condition of the patient.  Level V caveat- AMS   Past Medical History:  Diagnosis Date  . Enlarged prostate     Patient Active Problem List   Diagnosis Date Noted  . 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 APPLICATION OF CRANIAL NAVIGATION Left 09/29/2018   Procedure: APPLICATION OF CRANIAL NAVIGATION;  Surgeon: [name], MD;  Location: [place];  Service: Neurosurgery;  Laterality: Left;  . CRANIOTOMY Left 09/29/2018   Procedure: Sterotactic Left craniotomy for resection of tumor;  Surgeon: [name], MD;  Location: [facility];  Service: Neurosurgery;  Laterality: Left;  Sterotactic Left craniotomy for resection of tumor        Home Medications    Prior to Admission medications   Medication Sig Start Date End Date Taking? Authorizing Provider  finasteride (PROSCAR) 5 MG tablet Take 5 mg by mouth daily.   Yes [provider]  ondansetron (ZOFRAN) 8 MG tablet Take 1 tablet (8 mg total) by  mouth 2 (two) times daily as needed (nausea and vomiting). May take 30-60 minutes prior to Temodar administration if nausea/vomiting occurs. 10/21/18  Yes Vaslow, Acey Lav, MD  temozolomide (TEMODAR) 140 MG capsule Take 1 capsule (140 mg total) by mouth daily. May take on an empty stomach to decrease nausea & vomiting. 10/21/18  Yes Vaslow, Acey Lav, MD    Family History History reviewed. No pertinent family history.  Social History Social History   Tobacco Use  . Smoking status: Never Smoker  . Smokeless tobacco: Never Used  Substance Use Topics  . Alcohol use: Never    Frequency: Never  . Drug use: Never     Allergies   Patient has no known allergies.   Review of Systems Review of Systems  Psychiatric/Behavioral: Positive for confusion.  All other systems reviewed and are negative.    Physical Exam Updated Vital Signs BP (!) 144/77 (BP Location: Right Arm)   Pulse 69   Temp 98.6 F (37 C) (Oral)   Resp 19   Ht 6' (1.829 m)   Wt 84.7 kg   SpO2 95%   BMI 25.33 kg/m   Physical Exam Vitals signs and nursing note reviewed.  Constitutional:      Comments: Confused   HENT:     Head: Normocephalic.     Nose: Nose normal.     Mouth/Throat:     Mouth: Mucous membranes are moist.  Eyes:     Extraocular Movements: Extraocular movements intact.     Pupils: Pupils are equal, round, and reactive to light.  Neck:     Musculoskeletal: Normal range of motion.  Cardiovascular:     Rate and Rhythm: Normal rate and regular rhythm.     Pulses: Normal pulses.     Heart sounds: Normal heart sounds.  Pulmonary:     Effort: Pulmonary effort is normal.     Breath sounds: Normal breath sounds.  Abdominal:     General: Abdomen is flat.  Musculoskeletal: Normal range of motion.  Skin:    General: Skin is warm.     Capillary Refill: Capillary refill takes less than 2 seconds.  Neurological:     Comments: Confused, mumbling. A & O x 0. Keep on repeating his birthday. No  seizure like activity. No tremors. Moving all extremities but not following commands   Psychiatric:     Comments: Unable       ED Treatments / Results  Labs (all labs ordered are listed, but only abnormal results are displayed) Labs Reviewed  SARS CORONAVIRUS 2 ([facility])  CBC WITH DIFFERENTIAL/PLATELET  COMPREHENSIVE METABOLIC PANEL  ETHANOL  SALICYLATE LEVEL  ACETAMINOPHEN LEVEL  URINALYSIS, ROUTINE W REFLEX MICROSCOPIC  AMMONIA  RAPID URINE DRUG SCREEN, HOSP PERFORMED    EKG None  Radiology Ct Head Wo Contrast  Result Date: 10/28/2018 CLINICAL DATA:  Confusion, history of brain malignancy EXAM: CT HEAD WITHOUT CONTRAST TECHNIQUE: Contiguous axial images were obtained from the base of the skull through the vertex without intravenous contrast. COMPARISON:  MR September 30, 2018, CT head September 27, 2018 FINDINGS: Brain: There is a region of gliosis involving the left occipital and parietal lobes with postsurgical changes of prior craniotomy and resection of the previously seen necrotic mass. Some residual heterogeneity remains at the resection site, incompletely assessed on this unenhanced CT. Mild overlying hyperattenuating dural thickening is present as well which can be an expected finding post resection. Mild residual effacement of the left hemispheric sulci. No evidence of acute infarction, hemorrhage, hydrocephalus, or new extra-axial collection. Vascular: No hyperdense vessel or unexpected calcification. Skull: Post craniotomy findings, as above with mild overlying soft tissue infiltration. No large collection. No acute osseous abnormality or suspicious osseous lesion. Sinuses/Orbits: Paranasal sinuses and mastoid air cells are predominantly clear. Included orbital structures are unremarkable. Other: None IMPRESSION: 1. Postsurgical changes of prior craniotomy and resection of the previously seen necrotic mass in the left occipital and  parietal lobes. Some residual heterogeneity remains at the resection site, incompletely assessed on this unenhanced CT. Consider contrast-enhanced MRI for further evaluation and comparison to postsurgical baseline imaging. 2. No other acute intracranial abnormality. Electronically Signed   By: Lovena Le M.D.   On: 10/28/2018 19:59   Dg Chest Port 1 View  Result Date: 10/28/2018 CLINICAL DATA:  Confusion and altered mental status. EXAM: PORTABLE CHEST 1 VIEW COMPARISON:  Chest CT 09/27/2018 FINDINGS: Low lung volumes.The cardiomediastinal contours are normal. Minor basilar atelectasis. Pulmonary vasculature is normal. No consolidation, pleural effusion, or pneumothorax. No acute osseous abnormalities are seen. IMPRESSION: Low lung volumes with mild bibasilar atelectasis. Electronically Signed   By: Keith Rake M.D.   On: 10/28/2018 19:33    Procedures Procedures (including critical care time)  Medications Ordered in ED Medications  sodium chloride 0.9 % bolus 1,000 mL (1,000 mLs Intravenous New Bag/Given 10/28/18 2007)     Initial Impression / Assessment  and Plan / ED Course  I have reviewed the triage vital signs and the nursing notes.  Pertinent labs & imaging results that were available during my care of the patient were reviewed by me and considered in my medical decision making (see chart for details).       Cody [name] is a 58 y.o. male here with AMS.  Last normal was sometime this morning.  Patient appears very confused and unable to give any consistent history. Will get labs, CT head, ammonia level.   10:03 PM Labs unremarkable CT head showed increased edema. I talked to Dr. Rory Percy from neurology, who recommend IV decadron and MRI brain w/wo and then reconsult. Given that he is still altered, patient will need admission. Signed out to oncoming PA.    Final Clinical Impressions(s) / ED Diagnoses   Final diagnoses:  None    ED Discharge Orders    None        Drenda Freeze, MD 10/28/18 2205

## 2018-10-28 NOTE — ED Notes (Signed)
Pt transported to MRI 

## 2018-10-28 NOTE — ED Notes (Signed)
Patient states that he needs to use the restroom. Patient provided urinal and offered help, but states he would like to try himself. Will continue to monitor patient.

## 2018-10-29 DIAGNOSIS — R4182 Altered mental status, unspecified: Secondary | ICD-10-CM | POA: Diagnosis not present

## 2018-10-29 DIAGNOSIS — N400 Enlarged prostate without lower urinary tract symptoms: Secondary | ICD-10-CM | POA: Diagnosis present

## 2018-10-29 DIAGNOSIS — C719 Malignant neoplasm of brain, unspecified: Secondary | ICD-10-CM | POA: Diagnosis present

## 2018-10-29 DIAGNOSIS — Z20828 Contact with and (suspected) exposure to other viral communicable diseases: Secondary | ICD-10-CM | POA: Diagnosis present

## 2018-10-29 DIAGNOSIS — G936 Cerebral edema: Secondary | ICD-10-CM | POA: Diagnosis present

## 2018-10-29 DIAGNOSIS — N183 Chronic kidney disease, stage 3 (moderate): Secondary | ICD-10-CM | POA: Diagnosis present

## 2018-10-29 DIAGNOSIS — R569 Unspecified convulsions: Secondary | ICD-10-CM | POA: Diagnosis present

## 2018-10-29 DIAGNOSIS — G9341 Metabolic encephalopathy: Secondary | ICD-10-CM

## 2018-10-29 DIAGNOSIS — Z79899 Other long term (current) drug therapy: Secondary | ICD-10-CM | POA: Diagnosis not present

## 2018-10-29 DIAGNOSIS — C714 Malignant neoplasm of occipital lobe: Secondary | ICD-10-CM | POA: Diagnosis present

## 2018-10-29 LAB — RAPID URINE DRUG SCREEN, HOSP PERFORMED
Amphetamines: NOT DETECTED
Barbiturates: NOT DETECTED
Benzodiazepines: NOT DETECTED
Cocaine: NOT DETECTED
Opiates: NOT DETECTED
Tetrahydrocannabinol: NOT DETECTED

## 2018-10-29 LAB — BASIC METABOLIC PANEL
Anion gap: 6 (ref 5–15)
BUN: 14 mg/dL (ref 6–20)
CO2: 26 mmol/L (ref 22–32)
Calcium: 8.8 mg/dL — ABNORMAL LOW (ref 8.9–10.3)
Chloride: 106 mmol/L (ref 98–111)
Creatinine, Ser: 1.18 mg/dL (ref 0.61–1.24)
GFR calc Af Amer: >60 mL/min (ref >60)
GFR calc non Af Amer: >60 mL/min (ref >60)
Glucose, Bld: 167 mg/dL — ABNORMAL HIGH (ref 70–99)
Potassium: 4.4 mmol/L (ref 3.5–5.1)
Sodium: 138 mmol/L (ref 135–145)

## 2018-10-29 MED ORDER — LEVETIRACETAM IN NACL 500 MG/100ML IV SOLN
500.0000 mg | Freq: Two times a day (BID) | INTRAVENOUS | Status: DC
  Administered 2018-10-29 – 2018-10-30 (×4): 500 mg via INTRAVENOUS
  Filled 2018-10-29 (×4): qty 100

## 2018-10-29 MED ORDER — TAMSULOSIN HCL 0.4 MG PO CAPS
0.4000 mg | ORAL_CAPSULE | Freq: Every day | ORAL | Status: DC
  Administered 2018-10-29 – 2018-10-31 (×4): 0.4 mg via ORAL
  Filled 2018-10-29 (×3): qty 1

## 2018-10-29 MED ORDER — SODIUM CHLORIDE 0.9% FLUSH
3.0000 mL | Freq: Two times a day (BID) | INTRAVENOUS | Status: DC
  Administered 2018-10-29 – 2018-10-31 (×5): 3 mL via INTRAVENOUS

## 2018-10-29 MED ORDER — DEXAMETHASONE SODIUM PHOSPHATE 4 MG/ML IJ SOLN
4.0000 mg | Freq: Four times a day (QID) | INTRAMUSCULAR | Status: DC
  Administered 2018-10-29 – 2018-10-31 (×11): 4 mg via INTRAVENOUS
  Filled 2018-10-29 (×11): qty 1

## 2018-10-29 MED ORDER — ENOXAPARIN SODIUM 40 MG/0.4ML ~~LOC~~ SOLN
40.0000 mg | SUBCUTANEOUS | Status: DC
  Administered 2018-10-29 – 2018-10-30 (×2): 40 mg via SUBCUTANEOUS
  Filled 2018-10-29 (×2): qty 0.4

## 2018-10-29 MED ORDER — SODIUM CHLORIDE 0.9 % IV SOLN: 250.0000 mL | INTRAVENOUS | Status: DC | PRN

## 2018-10-29 MED ORDER — FINASTERIDE 5 MG PO TABS
5.0000 mg | ORAL_TABLET | Freq: Every day | ORAL | Status: DC
  Administered 2018-10-29 – 2018-10-31 (×3): 5 mg via ORAL
  Filled 2018-10-29 (×3): qty 1

## 2018-10-29 MED ORDER — ONDANSETRON HCL 4 MG PO TABS: 8.0000 mg | ORAL_TABLET | Freq: Two times a day (BID) | ORAL | Status: DC | PRN

## 2018-10-29 MED ORDER — SODIUM CHLORIDE 0.9% FLUSH
3.0000 mL | INTRAVENOUS | Status: DC | PRN
  Administered 2018-10-29: 11:00:00 3 mL via INTRAVENOUS
  Filled 2018-10-29: qty 3

## 2018-10-29 NOTE — ED Notes (Signed)
Pt moved to a hospital bed for comfort due to ED holding.

## 2018-10-29 NOTE — Evaluation (Signed)
Clinical/Bedside Swallow Evaluation Patient Details  Name: Cody [name] MRN: [id number] Date of Birth: Sep 19, 1960  Today's Date: 10/29/2018 Time: SLP Start Time (ACUTE ONLY): 1045 SLP Stop Time (ACUTE ONLY): 1100 SLP Time Calculation (min) (ACUTE ONLY): 15 min  Past Medical History:  Past Medical History:  Diagnosis Date  . Enlarged prostate    Past Surgical History:  Past Surgical History:  Procedure Laterality Date  . APPLICATION OF CRANIAL NAVIGATION Left 09/29/2018   Procedure: APPLICATION OF CRANIAL NAVIGATION;  Surgeon: [name], MD;  Location: [place];  Service: Neurosurgery;  Laterality: Left;  . CRANIOTOMY Left 09/29/2018   Procedure: Sterotactic Left craniotomy for resection of tumor;  Surgeon: [name], MD;  Location: [facility];  Service: Neurosurgery;  Laterality: Left;  Sterotactic Left craniotomy for resection of tumor   HPI:  Patietn is a 58 y.o. male with PMH: glioblastoma s/p surgical resection in August. He had been weaned of steroids and off seizure ppx but in AM on 9/25, patient had fairly acute change in mental status which was noticed by wife. MRI showed likely tumor progression.   Assessment / Plan / Recommendation Clinical Impression  Patient presents with an oropharyngeal swallow that is WNL without any overt s/s of aspiration or penetration of thin liquids. Voice remained clear throughout, patient with strong cough when cued and patient did not endorse any pain, discomfort or dificulty with swallowing. As per chart review, his CXR was unremarkable, he has no history of dysphagia. SLP Visit Diagnosis: Dysphagia, unspecified (R13.10)    Aspiration Risk  No limitations    Diet Recommendation Thin liquid;Regular   Liquid Administration via: Straw;Cup Medication Administration: Whole meds with liquid Supervision: Patient able to self feed    Other  Recommendations     Follow up Recommendations None      Frequency and Duration   N/A          Prognosis   N/A     Swallow Study   General Date of Onset: 10/28/18 HPI: Cody [name] is a 58 y.o. male with PMH: glioblastoma s/p surgical resection in August. He had been weaned of steroids and off seizure ppx but in AM on 9/25, patient had fairly acute change in mental status which was noticed by wife. MRI showed likely tumor progression. Type of Study: Bedside Swallow Evaluation Previous Swallow Assessment: N/A Diet Prior to this Study: Regular;Thin liquids Temperature Spikes Noted: No Respiratory Status: Room air History of Recent Intubation: No Behavior/Cognition: Cooperative;Pleasant mood;Alert Oral Cavity Assessment: Within Functional Limits Oral Care Completed by SLP: No Oral Cavity - Dentition: Adequate natural dentition Vision: Functional for self-feeding Self-Feeding Abilities: Able to feed self Patient Positioning: Upright in bed Baseline Vocal Quality: Normal Volitional Cough: Strong    Oral/Motor/Sensory Function Overall Oral Motor/Sensory Function: Within functional limits   Ice Chips     Thin Liquid Thin Liquid: Within functional limits Presentation: Straw;Self Fed Other Comments: No overt s/s of aspiration or penetration with successive sips of thin liquids.    Nectar Thick     Honey Thick     Puree Puree: Not tested   Solid     Solid: Not tested      [name] 10/29/2018,2:09 PM  Sonia Baller, MA, CCC-SLP Speech Therapy [facility]  Acute Rehab Pager: [phone]

## 2018-10-29 NOTE — Progress Notes (Signed)
[facility]                                                                              Patient Demographics  Cody [name], is a 58 y.o. male, DOB - 1960/11/10, [id number]  Admit date - 10/28/2018   Admitting Physician No admitting provider for patient encounter.  Outpatient Primary MD for the patient is Maury Dus, MD  Outpatient specialists:   LOS - 0  days   Medical records reviewed and are as summarized below:    Chief Complaint  Patient presents with   Altered Mental Status       Brief summary   Patient is a 58 year old male with history of glioblastoma, diagnosed recently, status post craniotomy and resection on 09/29/2018 by Dr Kathyrn Sheriff, treated with steroids and perioperative antiepileptics, weaned off, currently on Temodar.  Patient presented to ED with evaluation for confusion that was noted upon waking up on the morning of admission.  Patient's wife noted him confused when she returned from work in the evening.  CT head showed postoperative changes consistent with craniotomy, resection.  MRI of the brain showed marked worsening of the left occipital/posterior parietal region was consistent with progression of underlying malignancy, increased amount of enhancing mass and increase in local edema.  Neurology was consulted.  Patient was admitted for further work-up.   Assessment & Plan    Principal Problem:   Acute metabolic encephalopathy in the setting of GBM, possibly had a seizure - Appreciate neurology recommendations, continue IV Keppra, IV Decadron 4 mg every 6 hours - EEG today - Discussed with Dr. Mickeal Skinner, recommended hold Temodar, continue current dose of IV Decadron while inpatient.  When ready to be discharged, DC on 4 mg p.o. daily.  - Per Dr. Mickeal Skinner, this is expected findings on MRI, patient has been recovering from his surgery and not being actively treated for GBM and likely had a seizure.  Recommended no XRT for now, if  patient is discharged tomorrow, will see him in the clinic on Monday for further evaluation and recommendations. 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 Kathyrn Sheriff, patient had been weaned off of steroids and anti-epileptics.  - Patient was seen by Dr. Mickeal Skinner on 10/18/2018 for initial consult, had recommended 6 weeks of intensity modulated XRT, concurrent Temodar.  XRT to be started 1 month from craniotomy.  Patient underwent CT simulation for XRT on 9/21. - will follow recommendations from Dr. Mickeal Skinner is #1     BPH (benign prostatic hyperplasia) -Continue tamsulosin  Code Status: Full code DVT Prophylaxis:  Lovenox  Family Communication: Discussed all imaging results, lab results, explained to the patient's wife in detail on the phone.   Disposition Plan: Remains inpatient, high risk for seizures, on high-dose IV steroids, Keppra.  If patient considerably improves tomorrow with his confusion, no seizures in next 24 hours, possibly DC home tomorrow  Time Spent in minutes   35 minutes  Procedures:  MRI  brain  Consultants:   Neurology Neuro-oncology, Dr. Mickeal Skinner  Antimicrobials:   Anti-infectives (From admission, onward)   None         Medications  Scheduled Meds:  dexamethasone (DECADRON) injection  4 mg Intravenous Q6H   enoxaparin (LOVENOX) injection  40 mg Subcutaneous Q24H   finasteride  5 mg Oral Daily   sodium chloride flush  3 mL Intravenous Q12H   tamsulosin  0.4 mg Oral QPC breakfast   Continuous Infusions:  sodium chloride     levETIRAcetam Stopped (10/29/18 1049)   PRN Meds:.sodium chloride, ondansetron, sodium chloride flush      Subjective:   Cody [name] was seen and examined today.  Still very confused, oriented to self only.   No further seizures this morning.  Difficult to obtain review of system from the patient.    Objective:   Vitals:   10/29/18 0530 10/29/18 0601 10/29/18 0939 10/29/18 1014  BP: 109/64 124/85 123/61 125/70  Pulse: 62 82 70 (!) 59  Resp: 18 17 16 15   Temp:      TempSrc:      SpO2: 100% 100% 100% 100%  Weight:      Height:        Intake/Output Summary (Last 24 hours) at 10/29/2018 1105 Last data filed at 10/29/2018 1049 Gross per 24 hour  Intake 1200 ml  Output --  Net 1200 ml     Wt Readings from Last 3 Encounters:  10/28/18 84.7 kg  10/18/18 84.7 kg  09/28/18 81.5 kg     Exam  General: Awake and oriented to self only, still very confused, no dysarthria, follows commands  Eyes:   HEENT:  Atraumatic, normocephalic,  Cardiovascular: S1 S2 auscultated, no murmurs, RRR  Respiratory: Clear to auscultation bilaterally, no wheezing,  Gastrointestinal: Soft, nontender, nondistended, + bowel sounds  Ext: no pedal edema bilaterally  Neuro: Strength 5/5 bilateral upper and lower extremities.  Musculoskeletal: No digital cyanosis, clubbing  Skin: No rashes  Psych: Confused   Data Reviewed:  I have personally reviewed following labs and imaging studies  Micro Results Recent Results (from the past 240 hour(s))  SARS Coronavirus 2 [facility] order, Performed in [facility] hospital lab) Nasopharyngeal Nasopharyngeal Swab     Status: None   Collection Time: 10/28/18  8:08 PM   Specimen: Nasopharyngeal Swab  Result Value Ref Range Status   SARS Coronavirus 2 NEGATIVE NEGATIVE Final    Comment: (NOTE) If result is NEGATIVE SARS-CoV-2 target nucleic acids are NOT DETECTED. The SARS-CoV-2 RNA is generally detectable in upper and lower  respiratory specimens during the acute phase of infection. The lowest  concentration of SARS-CoV-2 viral copies this assay can detect is 250  copies / mL. A negative result does not preclude SARS-CoV-2 infection  and should not be used  as the sole basis for treatment or other  patient management decisions.  A negative result may occur with  improper specimen collection / handling, submission of specimen other  than nasopharyngeal swab, presence of viral mutation(s) within the  areas targeted by this assay, and inadequate number of viral copies  (<250 copies / mL). A negative result must be combined with clinical  observations, patient history, and epidemiological information. If result is POSITIVE SARS-CoV-2 target nucleic acids are DETECTED. The SARS-CoV-2 RNA is generally detectable in upper and lower  respiratory specimens dur ing the acute phase of infection.  Positive  results are indicative of active infection with SARS-CoV-2.  Clinical  correlation with patient  history and other diagnostic information is  necessary to determine patient infection status.  Positive results do  not rule out bacterial infection or co-infection with other viruses. If result is PRESUMPTIVE POSTIVE SARS-CoV-2 nucleic acids MAY BE PRESENT.   A presumptive positive result was obtained on the submitted specimen  and confirmed on repeat testing.  While 2019 novel coronavirus  (SARS-CoV-2) nucleic acids may be present in the submitted sample  additional confirmatory testing may be necessary for epidemiological  and / or clinical management purposes  to differentiate between  SARS-CoV-2 and other Sarbecovirus currently known to infect humans.  If clinically indicated additional testing with an alternate test  methodology [phone]) is advised. The SARS-CoV-2 RNA is generally  detectable in upper and lower respiratory sp ecimens during the acute  phase of infection. The expected result is Negative. Fact Sheet for Patients:  StrictlyIdeas.no Fact Sheet for Healthcare Providers: BankingDealers.co.za This test is not yet approved or cleared by the [place] FDA and has been authorized for  detection and/or diagnosis of SARS-CoV-2 by FDA under an Emergency Use Authorization (EUA).  This EUA will remain in effect (meaning this test can be used) for the duration of the COVID-19 declaration under Section 564(b)(1) of the Act, 21 U.S.C. section 360bbb-3(b)(1), unless the authorization is terminated or revoked sooner. Performed at [facility], [place] [address], [place], [place] [address]     Radiology Reports Ct Head Wo Contrast  Result Date: 10/28/2018 CLINICAL DATA:  Confusion, history of brain malignancy EXAM: CT HEAD WITHOUT CONTRAST TECHNIQUE: Contiguous axial images were obtained from the base of the skull through the vertex without intravenous contrast. COMPARISON:  MR September 30, 2018, CT head September 27, 2018 FINDINGS: Brain: There is a region of gliosis involving the left occipital and parietal lobes with postsurgical changes of prior craniotomy and resection of the previously seen necrotic mass. Some residual heterogeneity remains at the resection site, incompletely assessed on this unenhanced CT. Mild overlying hyperattenuating dural thickening is present as well which can be an expected finding post resection. Mild residual effacement of the left hemispheric sulci. No evidence of acute infarction, hemorrhage, hydrocephalus, or new extra-axial collection. Vascular: No hyperdense vessel or unexpected calcification. Skull: Post craniotomy findings, as above with mild overlying soft tissue infiltration. No large collection. No acute osseous abnormality or suspicious osseous lesion. Sinuses/Orbits: Paranasal sinuses and mastoid air cells are predominantly clear. Included orbital structures are unremarkable. Other: None IMPRESSION: 1. Postsurgical changes of prior craniotomy and resection of the previously seen necrotic mass in the left occipital and parietal lobes. Some residual heterogeneity remains at the resection site, incompletely assessed on this unenhanced CT.  Consider contrast-enhanced MRI for further evaluation and comparison to postsurgical baseline imaging. 2. No other acute intracranial abnormality. Electronically Signed   By: Lovena Le M.D.   On: 10/28/2018 19:59   Mr Jeri Cos And Wo Contrast  Result Date: 10/28/2018 CLINICAL DATA:  Altered level of consciousness. Previous brain tumor resection. EXAM: MRI HEAD WITHOUT AND WITH CONTRAST TECHNIQUE: Multiplanar, multiecho pulse sequences of the brain and surrounding structures were obtained without and with intravenous contrast. CONTRAST:  35mL GADAVIST GADOBUTROL 1 MMOL/ML IV SOLN COMPARISON:  Head CT 10/28/2018.  MRI 09/30/2018. FINDINGS: Brain: Worsened pattern with increased mass effect and edema in the left occipital and parietal brain. Areas increasing enhancement in the occipital and parietal region probably represent worsening of residual tumor. Cannot rule out some component of pseudo progression. No evidence of significant hemorrhage,  hydrocephalus or extra-axial fluid collection. Vascular: Major vessels at the base of the brain show flow. Skull and upper cervical spine: Otherwise negative Sinuses/Orbits: Clear/normal Other: None IMPRESSION: Marked worsening of the pattern in the left occipital and posterior parietal region, most consistent with progression of the underlying malignancy with increase in the amount of enhancing mass and increase in regional edema. There could be some element of pseudoprogression, but I think the majority represents true progression. Electronically Signed   By: Nelson Chimes M.D.   On: 10/28/2018 22:37   Mr Jeri Cos [id number] Contrast  Result Date: 09/30/2018 CLINICAL DATA:  Postop day 1 status post left occipital tumor resection. EXAM: MRI HEAD WITHOUT AND WITH CONTRAST TECHNIQUE: Multiplanar, multiecho pulse sequences of the brain and surrounding structures were obtained without and with intravenous contrast. CONTRAST:  8 mL Gadavist COMPARISON:  09/27/2018 FINDINGS: Brain:  Sequelae of interval left occipital tumor resection are identified with blood products in the resection bed. There is small volume pneumocephalus. There is moderate residual edema in the left occipital and parietal lobes with decreased regional mass effect. The atrium of the left lateral ventricle remains partially effaced. There is small volume nodular and curvilinear enhancement along the margins of the resection cavity including a 4 mm nodular focus anterosuperiorly (series 18, image 29). There is residual enhancing tumor anterior to the resection cavity involving the occipital horn of the left lateral ventricle with ependymal enhancement (series 18, image 27). A 2 mm satellite focus of enhancement more inferiorly in the left occipital lobe is unchanged (series 18, image 19), and there is also a 3 mm focus of enhancement anterior and lateral to the resection cavity in the occipital lobe (series 18, image 23). An extra-axial fluid collection subjacent to the craniotomy measures 8 mm in thickness. Mild dural enhancement relatively diffusely over the left cerebral convexity is likely postoperative. No sizable acute infarct is evident. Encephalomalacia is again noted anteriorly in the frontal lobes compatible with remote trauma. Vascular: Major intracranial vascular flow voids are preserved. Skull and upper cervical spine: Left occipital craniotomy with overlying scalp swelling. Sinuses/Orbits: Unremarkable orbits. Paranasal sinuses and mastoid air cells are clear. Other: None. IMPRESSION: Interval left occipital tumor resection with decreased mass effect and residual tumor as above, most notably involving the occipital horn of the left lateral ventricle. Electronically Signed   By: Logan Bores M.D.   On: 09/30/2018 09:11   Dg Chest Port 1 View  Result Date: 10/28/2018 CLINICAL DATA:  Confusion and altered mental status. EXAM: PORTABLE CHEST 1 VIEW COMPARISON:  Chest CT 09/27/2018 FINDINGS: Low lung volumes.The  cardiomediastinal contours are normal. Minor basilar atelectasis. Pulmonary vasculature is normal. No consolidation, pleural effusion, or pneumothorax. No acute osseous abnormalities are seen. IMPRESSION: Low lung volumes with mild bibasilar atelectasis. Electronically Signed   By: Keith Rake M.D.   On: 10/28/2018 19:33    Lab Data:  CBC: Recent Labs  Lab 10/28/18 2004  WBC 7.4  NEUTROABS 6.0  HGB 13.1  HCT 39.0  MCV 84.4  PLT 0000000   Basic Metabolic Panel: Recent Labs  Lab 10/28/18 2004 10/29/18 0608  NA 139 138  K 4.2 4.4  CL 105 106  CO2 26 26  GLUCOSE 113* 167*  BUN 16 14  CREATININE 1.40* 1.18  CALCIUM 8.7* 8.8*   GFR: Estimated Creatinine Clearance: 77.6 mL/min (by C-G formula based on SCr of 1.18 mg/dL). Liver Function Tests: Recent Labs  Lab 10/28/18 2004  AST 20  ALT 18  ALKPHOS 99  BILITOT 0.6  PROT 6.6  ALBUMIN 3.9   No results for input(s): LIPASE, AMYLASE in the last 168 hours. Recent Labs  Lab 10/28/18 2004  AMMONIA 10   Coagulation Profile: No results for input(s): INR, PROTIME in the last 168 hours. Cardiac Enzymes: No results for input(s): CKTOTAL, CKMB, CKMBINDEX, TROPONINI in the last 168 hours. BNP (last 3 results) No results for input(s): PROBNP in the last 8760 hours. HbA1C: No results for input(s): HGBA1C in the last 72 hours. CBG: No results for input(s): GLUCAP in the last 168 hours. Lipid Profile: No results for input(s): CHOL, HDL, LDLCALC, TRIG, CHOLHDL, LDLDIRECT in the last 72 hours. Thyroid Function Tests: No results for input(s): TSH, T4TOTAL, FREET4, T3FREE, THYROIDAB in the last 72 hours. Anemia Panel: No results for input(s): VITAMINB12, FOLATE, FERRITIN, TIBC, IRON, RETICCTPCT in the last 72 hours. Urine analysis:    Component Value Date/Time   COLORURINE YELLOW 10/28/2018 1904   APPEARANCEUR CLEAR 10/28/2018 1904   LABSPEC 1.014 10/28/2018 1904   PHURINE 5.0 10/28/2018 1904   GLUCOSEU NEGATIVE 10/28/2018  1904   HGBUR NEGATIVE 10/28/2018 1904   BILIRUBINUR NEGATIVE 10/28/2018 1904   KETONESUR NEGATIVE 10/28/2018 1904   PROTEINUR NEGATIVE 10/28/2018 1904   NITRITE NEGATIVE 10/28/2018 1904   LEUKOCYTESUR NEGATIVE 10/28/2018 1904     Divit Stipp M.D. [facility] 10/29/2018, 11:05 AM  Pager: [phone] Between 7am to 7pm - call Pager - [phone]  After 7pm go to [email or web address] - password [email or web address]  Call night coverage person covering after 7pm

## 2018-10-29 NOTE — Consult Note (Signed)
Neurology Consultation  Reason for Consult: Confusion, altered mental status, history of GBM Referring Physician: Dr. Shirlyn Goltz  CC: Confusion in a patient with history of GBM  History is obtained from: Patient, chart review  HPI: Cody [name] is a 58 y.o. male past medical history of left occipital glioblastoma status post Craniotomy and resection by Dr. Kathyrn Sheriff on 09/29/2018, with steroids and perioperative antiepileptics weaned off and currently on Temodar, presented to the emergency room for evaluation of confusion that was noted upon waking up in the morning of 10/28/2018. He was noted by his wife a little confused this morning, she went to work and came back home after he called her at 6 PM asking for some help.  She found her confused and his words not making any sense.  Given the history of GBM, brought into the emergency room at [facility] for further evaluation.  On initial ED provider evaluation, he was awake alert but kept repeating his birthday or name and did not appear to be following commands consistently. After obtaining a noncontrast head CT which showed postoperative changes consistent with the craniotomy and resection of the glioblastoma, I was called over the phone for recommendation on further imaging.  I recommended MR imaging with contrast of the brain.  I also recommended Decadron IV 10 mg and loading with Keppra at the time. MR brain completed and shows market worsening of the left occipital/posterior parietal region most consistent with progression of underlying malignancy with increased amount of enhancing mass and increase in local edema.  Some element of pseudo-progression but majority appears to be true progression of the tumor.  ROS: Unable to reliably obtain due to altered mental status.   Past Medical History:  Diagnosis Date  . Enlarged prostate    History reviewed. No pertinent family history.   Social History:   reports that he has never  smoked. He has never used smokeless tobacco. He reports that he does not drink alcohol or use drugs.  Medications  Current Facility-Administered Medications:  .  0.9 %  sodium chloride infusion, 250 mL, Intravenous, PRN, Wouk, Ailene Rud, MD .  dexamethasone (DECADRON) injection 4 mg, 4 mg, Intravenous, Q6H, Wouk, Ailene Rud, MD, 4 mg at 10/29/18 0138 .  enoxaparin (LOVENOX) injection 40 mg, 40 mg, Subcutaneous, Q24H, Wouk, Ailene Rud, MD .  finasteride (PROSCAR) tablet 5 mg, 5 mg, Oral, Daily, Wouk, Ailene Rud, MD .  levETIRAcetam (KEPPRA) IVPB 500 mg/100 mL premix, 500 mg, Intravenous, Q12H, Wouk, Ailene Rud, MD .  ondansetron [facility]) tablet 8 mg, 8 mg, Oral, BID PRN, Wouk, Ailene Rud, MD .  sodium chloride flush (NS) 0.9 % injection 3 mL, 3 mL, Intravenous, Q12H, Wouk, Ailene Rud, MD, 3 mL at 10/29/18 0138 .  sodium chloride flush (NS) 0.9 % injection 3 mL, 3 mL, Intravenous, PRN, Wouk, Ailene Rud, MD .  tamsulosin (FLOMAX) capsule 0.4 mg, 0.4 mg, Oral, QPC breakfast, Wouk, Ailene Rud, MD  Current Outpatient Medications:  .  finasteride (PROSCAR) 5 MG tablet, Take 5 mg by mouth daily., Disp: , Rfl:  .  ondansetron (ZOFRAN) 8 MG tablet, Take 1 tablet (8 mg total) by mouth 2 (two) times daily as needed (nausea and vomiting). May take 30-60 minutes prior to Temodar administration if nausea/vomiting occurs., Disp: 30 tablet, Rfl: 1 .  temozolomide (TEMODAR) 140 MG capsule, Take 1 capsule (140 mg total) by mouth daily. May take on an empty stomach to decrease nausea & vomiting., Disp: 42 capsule,  Rfl: 0  Exam: Current vital signs: BP 111/66 (BP Location: Right Arm)   Pulse (!) 57   Temp 98.6 F (37 C) (Oral)   Resp 16   Ht 6' (1.829 m)   Wt 84.7 kg   SpO2 100%   BMI 25.33 kg/m  Vital signs in last 24 hours: Temp:  [98.6 F (37 C)] 98.6 F (37 C) (09/25 1906) Pulse Rate:  [55-84] 57 (09/26 0130) Resp:  [13-25] 16 (09/26 0130) BP: (111-145)/(63-80) 111/66 (09/26  0130) SpO2:  [95 %-100 %] 100 % (09/26 0130) Weight:  [84.7 kg] 84.7 kg (09/25 1939) At the time of my examination, patient was sleeping in bed.  I woke him up and he was still a little groggy throughout the entire exam. General: Sleepy, arouses to voice, follows all commands. HEENT: Normocephalic atraumatic Lungs: Breathing normally saturating well at room air Cardiovascular: Regular rate rhythm Abdomen: Nondistended nontender Extremities: Warm well perfused Neurological exam Sleepy, arousable to voice, was able to wake up and follow commands consistently. Naming is intact.  Comprehension is intact.  Repetition is intact although all of them are very slow. Reduced attention concentration. No dysarthria Cranial nerves: Pupils are equal round reactive light, extraocular movements appear full, persistent right inferior quadrantanopsia, face symmetric, palate midline, tongue midline. Motor exam: No drift in any of the 4 extremities-5/5 all over. Sensory exam: Intact to light touch all over, but there is some extinction to the left stimulus on double simultaneous stimulation in the lower extremities. Coordination: No dysmetria noted Gait testing deferred at this time  Labs I have reviewed labs in epic and the results pertinent to this consultation are:  CBC    Component Value Date/Time   WBC 7.4 10/28/2018 2004   RBC 4.62 10/28/2018 2004   HGB 13.1 10/28/2018 2004   HCT 39.0 10/28/2018 2004   PLT 175 10/28/2018 2004   MCV 84.4 10/28/2018 2004   MCH 28.4 10/28/2018 2004   MCHC 33.6 10/28/2018 2004   RDW 13.0 10/28/2018 2004   LYMPHSABS 0.8 10/28/2018 2004   MONOABS 0.5 10/28/2018 2004   EOSABS 0.1 10/28/2018 2004   BASOSABS 0.0 10/28/2018 2004    CMP     Component Value Date/Time   NA 139 10/28/2018 2004   K 4.2 10/28/2018 2004   CL 105 10/28/2018 2004   CO2 26 10/28/2018 2004   GLUCOSE 113 (H) 10/28/2018 2004   BUN 16 10/28/2018 2004   CREATININE 1.40 (H) 10/28/2018  2004   CALCIUM 8.7 (L) 10/28/2018 2004   PROT 6.6 10/28/2018 2004   ALBUMIN 3.9 10/28/2018 2004   AST 20 10/28/2018 2004   ALT 18 10/28/2018 2004   ALKPHOS 99 10/28/2018 2004   BILITOT 0.6 10/28/2018 2004   GFRNONAA 56 (L) 10/28/2018 2004   GFRAA >60 10/28/2018 2004   Imaging I have reviewed the images obtained:  CT-scan of the brain-without contrast-postsurgical changes of prior craniotomy, resection of previously seen necrotic mass in the left occipital and parietal lobes.  Some residual heterogenicity remains at the resection slight which is incompletely assessed with the unenhanced CT. MRI examination of the brain with and without contrast showed marketed worsening of pattern in the left occipital and posterior parietal region most consistent with progression of the underlying malignancy with increase in the amount of enhancing mass and increased regional edema.  Some component of pseudo-progression but primarily progression.  Assessment:  58 year old man with past medical history of a GBM that was resected in October in  the left parietal/occipital lobes, on treatment with Temodar presents for evaluation of confusion that started yesterday morning. Patient is somewhat improved in comparison to the examination initially obtained by the ED providers after giving him steroids and a load of Keppra IV. I suspect that his current presentation is consistent with worsening and progressive GBM. Seizures cannot be ruled out.  Requires further work-up inpatient.  Impression: Evaluate for GBM progression Evaluate for seizures  Recommendations: Continue dexamethasone 4 mg every 6 IV Keppra load already given.  Continue Keppra 500 twice daily Maintain seizure precautions EEG in the morning We will need to notify Dr. Mickeal Skinner regarding patient's admission and guidance on continuing Temodar.  For now hold Temodar. Neurology will follow with you.  -- Amie Portland, MD [facility] Pager: [phone] If 7pm to 7am, please call on call as listed on AMION.

## 2018-10-30 ENCOUNTER — Inpatient Hospital Stay (HOSPITAL_COMMUNITY): Payer: BC Managed Care – PPO

## 2018-10-30 NOTE — Progress Notes (Signed)
PROGRESS NOTE    Cody [name]  [id number] DOB: [birth year] DOA: 10/28/2018 PCP: Maury Dus, MD   Brief Narrative:  Patient is a 58 year old male with history of recently diagnosed glioblastoma multiforme, status post craniotomy and resection on 09/29/2018 who presented to the emergency department with confusion, altered mental status CT head showed postoperative changes consistent with craniotomy, resection.  MRI of the brain showed marked worsening pattern of the left occipital/posterior parietal region suggesting progression of underlying malignancy, increased  enhancing mass and increasing local edema.  Neurology consulted.  Started on Toco for seizures and dexamethasone for brain edema.  Neuro oncology seeing patient tomorrow.  Awaiting EEG today.  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  Started on IV Keppra.  Neurology following.  Started on IV Decadron.  Plan for EEG today.  Dr. Mickeal Skinner, neuro oncology, will see the patient tomorrow. This morning he was completely alert and oriented.  Hemodynamically stable.  Does not have any focal neurological deficits.  MRI findings as above.  Continue IV Keppra.  Recently diagnosed left occipital glioblastoma: Diagnosed in August 2020.  Underwent left parietal-hospital craniotomy and resection of GBM on 09/29/2018 by neurosurgery.  Patient had been weaned off steroids and antiepileptics.  He is currently on Temodar.  Neuro oncology recommending 6 weeks of intensity moderated XRT.  Underwent CT simulation for XRT on 9/21.  Neuro-oncology will follow him here tomorrow.  BPH: Continue tamsulosin            DVT prophylaxis: Lovenox Code Status: Full Family  Communication: Called spouse and updated the plan Disposition Plan: Likely home tomorrow  Consultants: Neurology Procedures:None  Antimicrobials:  Anti-infectives (From admission, onward)   None      Subjective:  Patient seen and examined at bedside this morning.  Very comfortable.  Alert and oriented.  Denies any complaints.  Objective: Vitals:   10/29/18 1924 10/30/18 0009 10/30/18 0335 10/30/18 0719  BP: 126/75 123/69 109/62 122/66  Pulse: (!) 56 (!) 47 (!) 47 (!) 46  Resp: 17 16 17 16   Temp: 97.7 F (36.5 C) 98.1 F (36.7 C) 97.7 F (36.5 C) 97.7 F (36.5 C)  TempSrc: Oral Oral Oral Oral  SpO2: 98% 98% 99% 100%  Weight:      Height:        Intake/Output Summary (Last 24 hours) at 10/30/2018 1101 Last data filed at 10/30/2018 0303 Gross per 24 hour  Intake 100 ml  Output --  Net 100 ml   Filed Weights   10/28/18 1906 10/28/18 1939  Weight: 84.7 kg 84.7 kg    Examination:  General exam: Appears calm and comfortable ,Not in distress,average built HEENT:PERRL,Oral mucosa moist, Ear/Nose normal on gross exam Respiratory system: Bilateral equal air entry, normal vesicular breath sounds, no wheezes or crackles  Cardiovascular system: S1 & S2 heard, RRR. No JVD, murmurs, rubs, gallops or clicks. No pedal edema. Gastrointestinal system: Abdomen is nondistended, soft and nontender. No organomegaly or masses felt. Normal bowel sounds heard. Central nervous system: Alert and oriented. No focal neurological deficits. Extremities: No edema, no clubbing ,no cyanosis, distal peripheral pulses palpable. Skin: No rashes, lesions or ulcers,no icterus ,no pallor    Data Reviewed: I have personally reviewed following labs and imaging  studies  CBC: Recent Labs  Lab 10/28/18 2004  WBC 7.4  NEUTROABS 6.0  HGB 13.1  HCT 39.0  MCV 84.4  PLT 0000000   Basic Metabolic Panel: Recent Labs  Lab 10/28/18 2004 10/29/18 0608  NA 139 138  K 4.2 4.4  CL 105 106  CO2 26 26   GLUCOSE 113* 167*  BUN 16 14  CREATININE 1.40* 1.18  CALCIUM 8.7* 8.8*   GFR: Estimated Creatinine Clearance: 77.6 mL/min (by C-G formula based on SCr of 1.18 mg/dL). Liver Function Tests: Recent Labs  Lab 10/28/18 2004  AST 20  ALT 18  ALKPHOS 99  BILITOT 0.6  PROT 6.6  ALBUMIN 3.9   No results for input(s): LIPASE, AMYLASE in the last 168 hours. Recent Labs  Lab 10/28/18 2004  AMMONIA 10   Coagulation Profile: No results for input(s): INR, PROTIME in the last 168 hours. Cardiac Enzymes: No results for input(s): CKTOTAL, CKMB, CKMBINDEX, TROPONINI in the last 168 hours. BNP (last 3 results) No results for input(s): PROBNP in the last 8760 hours. HbA1C: No results for input(s): HGBA1C in the last 72 hours. CBG: No results for input(s): GLUCAP in the last 168 hours. Lipid Profile: No results for input(s): CHOL, HDL, LDLCALC, TRIG, CHOLHDL, LDLDIRECT in the last 72 hours. Thyroid Function Tests: No results for input(s): TSH, T4TOTAL, FREET4, T3FREE, THYROIDAB in the last 72 hours. Anemia Panel: No results for input(s): VITAMINB12, FOLATE, FERRITIN, TIBC, IRON, RETICCTPCT in the last 72 hours. Sepsis Labs: No results for input(s): PROCALCITON, LATICACIDVEN in the last 168 hours.  Recent Results (from the past 240 hour(s))  SARS Coronavirus 2 [facility] order, Performed in [facility] hospital lab) Nasopharyngeal Nasopharyngeal Swab     Status: None   Collection Time: 10/28/18  8:08 PM   Specimen: Nasopharyngeal Swab  Result Value Ref Range Status   SARS Coronavirus 2 NEGATIVE NEGATIVE Final    Comment: (NOTE) If result is NEGATIVE SARS-CoV-2 target nucleic acids are NOT DETECTED. The SARS-CoV-2 RNA is generally detectable in upper and lower  respiratory specimens during the acute phase of infection. The lowest  concentration of SARS-CoV-2 viral copies this assay can detect is 250  copies / mL. A negative result does not preclude SARS-CoV-2 infection  and should  not be used as the sole basis for treatment or other  patient management decisions.  A negative result may occur with  improper specimen collection / handling, submission of specimen other  than nasopharyngeal swab, presence of viral mutation(s) within the  areas targeted by this assay, and inadequate number of viral copies  (<250 copies / mL). A negative result must be combined with clinical  observations, patient history, and epidemiological information. If result is POSITIVE SARS-CoV-2 target nucleic acids are DETECTED. The SARS-CoV-2 RNA is generally detectable in upper and lower  respiratory specimens dur ing the acute phase of infection.  Positive  results are indicative of active infection with SARS-CoV-2.  Clinical  correlation with patient history and other diagnostic information is  necessary to determine patient infection status.  Positive results do  not rule out bacterial infection or co-infection with other viruses. If result is PRESUMPTIVE POSTIVE SARS-CoV-2 nucleic acids MAY BE PRESENT.   A presumptive positive result was obtained on the submitted specimen  and confirmed on repeat testing.  While 2019 novel coronavirus  (SARS-CoV-2) nucleic acids may be present in the submitted sample  additional confirmatory testing may be necessary for epidemiological  and / or clinical management purposes  to differentiate between  SARS-CoV-2 and other Sarbecovirus currently known to infect humans.  If clinically indicated additional testing with an alternate test  methodology [phone]) is advised. The SARS-CoV-2 RNA is generally  detectable in upper and lower respiratory sp ecimens during the acute  phase of infection. The expected result is Negative. Fact Sheet for Patients:  StrictlyIdeas.no Fact Sheet for Healthcare Providers: BankingDealers.co.za This test is not yet approved or cleared by the [place] FDA and has been  authorized for detection and/or diagnosis of SARS-CoV-2 by FDA under an Emergency Use Authorization (EUA).  This EUA will remain in effect (meaning this test can be used) for the duration of the COVID-19 declaration under Section 564(b)(1) of the Act, 21 U.S.C. section 360bbb-3(b)(1), unless the authorization is terminated or revoked sooner. Performed at [facility], [place] [address], [place], [place] [address]          Radiology Studies: Ct Head Wo Contrast  Result Date: 10/28/2018 CLINICAL DATA:  Confusion, history of brain malignancy EXAM: CT HEAD WITHOUT CONTRAST TECHNIQUE: Contiguous axial images were obtained from the base of the skull through the vertex without intravenous contrast. COMPARISON:  MR September 30, 2018, CT head September 27, 2018 FINDINGS: Brain: There is a region of gliosis involving the left occipital and parietal lobes with postsurgical changes of prior craniotomy and resection of the previously seen necrotic mass. Some residual heterogeneity remains at the resection site, incompletely assessed on this unenhanced CT. Mild overlying hyperattenuating dural thickening is present as well which can be an expected finding post resection. Mild residual effacement of the left hemispheric sulci. No evidence of acute infarction, hemorrhage, hydrocephalus, or new extra-axial collection. Vascular: No hyperdense vessel or unexpected calcification. Skull: Post craniotomy findings, as above with mild overlying soft tissue infiltration. No large collection. No acute osseous abnormality or suspicious osseous lesion. Sinuses/Orbits: Paranasal sinuses and mastoid air cells are predominantly clear. Included orbital structures are unremarkable. Other: None IMPRESSION: 1. Postsurgical changes of prior craniotomy and resection of the previously seen necrotic mass in the left occipital and parietal lobes. Some residual heterogeneity remains at the resection site, incompletely assessed  on this unenhanced CT. Consider contrast-enhanced MRI for further evaluation and comparison to postsurgical baseline imaging. 2. No other acute intracranial abnormality. Electronically Signed   By: Lovena Le M.D.   On: 10/28/2018 19:59   Mr Jeri Cos And Wo Contrast  Result Date: 10/28/2018 CLINICAL DATA:  Altered level of consciousness. Previous brain tumor resection. EXAM: MRI HEAD WITHOUT AND WITH CONTRAST TECHNIQUE: Multiplanar, multiecho pulse sequences of the brain and surrounding structures were obtained without and with intravenous contrast. CONTRAST:  47mL GADAVIST GADOBUTROL 1 MMOL/ML IV SOLN COMPARISON:  Head CT 10/28/2018.  MRI 09/30/2018. FINDINGS: Brain: Worsened pattern with increased mass effect and edema in the left occipital and parietal brain. Areas increasing enhancement in the occipital and parietal region probably represent worsening of residual tumor. Cannot rule out some component of pseudo progression. No evidence of significant hemorrhage, hydrocephalus or extra-axial fluid collection. Vascular: Major vessels at the base of the brain show flow. Skull and upper cervical spine: Otherwise negative Sinuses/Orbits: Clear/normal Other: None IMPRESSION: Marked worsening of the pattern in the left occipital and posterior parietal region, most consistent with progression of the underlying malignancy with increase in the amount of enhancing mass and increase in regional edema. There could be some element of pseudoprogression, but I think the majority represents true progression. Electronically Signed   By: Nelson Chimes  M.D.   On: 10/28/2018 22:37   Dg Chest Port 1 View  Result Date: 10/28/2018 CLINICAL DATA:  Confusion and altered mental status. EXAM: PORTABLE CHEST 1 VIEW COMPARISON:  Chest CT 09/27/2018 FINDINGS: Low lung volumes.The cardiomediastinal contours are normal. Minor basilar atelectasis. Pulmonary vasculature is normal. No consolidation, pleural effusion, or pneumothorax. No  acute osseous abnormalities are seen. IMPRESSION: Low lung volumes with mild bibasilar atelectasis. Electronically Signed   By: Keith Rake M.D.   On: 10/28/2018 19:33        Scheduled Meds:  dexamethasone (DECADRON) injection  4 mg Intravenous Q6H   finasteride  5 mg Oral Daily   sodium chloride flush  3 mL Intravenous Q12H   tamsulosin  0.4 mg Oral QPC breakfast   Continuous Infusions:  sodium chloride     levETIRAcetam 500 mg (10/30/18 0919)     LOS: 1 day    Time spent: 35 mins.More than 50% of that time was spent in counseling and/or coordination of care.      Shelly Coss, MD [facility] Pager [phone]  If 7PM-7AM, please contact night-coverage [email or web address] Password [email or web address] 10/30/2018, 11:01 AM

## 2018-10-30 NOTE — Procedures (Signed)
ELECTROENCEPHALOGRAM REPORT   Patient: Cody [name]       Room #: [id number] EEG No. ID: [id number] Age: 58 y.o.        Sex: male Referring Physician: Tawanna Solo Report Date:  10/30/2018        Interpreting Physician: Alexis Goodell  History: Cody [name] is an 58 y.o. male with altered mental status  Medications:  Decadron, Flomax, Keppra  Conditions of Recording:  This is a 21 channel routine scalp EEG performed with bipolar and monopolar montages arranged in accordance to the international 10/20 system of electrode placement. One channel was dedicated to EKG recording.  The patient is in the awake state.  Description:  The waking background activity consists of a low voltage, fairly well organized, 8 Hz alpha activity, seen from the parieto-occipital and posterior temporal regions.  Low voltage fast activity, poorly organized, is seen anteriorly and is at times superimposed on more posterior regions.  A mixture of theta and alpha rhythms are seen from the central and temporal regions.  This activity is often slowed and poorly sustained over the left hemisphere due to an underlying polymorphic delta rhythm. Hyperventilation and intermittent photic stimulation were not performed.  IMPRESSION: This is an abnormal electroencephlaogram secondary to findings consistent with a left breech rhythm which is consistent with the patient's history.     Alexis Goodell, MD Neurology [phone] 10/30/2018, 3:33 PM

## 2018-10-30 NOTE — Progress Notes (Signed)
EEG completed, results pending. 

## 2018-10-31 MED ORDER — LEVETIRACETAM 500 MG PO TABS: 500.0000 mg | ORAL_TABLET | Freq: Two times a day (BID) | ORAL | 0 refills | Status: DC

## 2018-10-31 MED ORDER — DEXAMETHASONE 4 MG PO TABS: 4.0000 mg | ORAL_TABLET | Freq: Every day | ORAL | 0 refills | Status: DC

## 2018-10-31 MED ORDER — LEVETIRACETAM 500 MG PO TABS
500.0000 mg | ORAL_TABLET | Freq: Two times a day (BID) | ORAL | Status: DC
  Administered 2018-10-31: 11:00:00 500 mg via ORAL
  Filled 2018-10-31: qty 1

## 2018-10-31 NOTE — Plan of Care (Signed)
Min assist transfers and adls

## 2018-10-31 NOTE — Discharge Summary (Signed)
Physician Discharge Summary  Cody [name] [id number] DOB: May 30, 1960 DOA: 10/28/2018  PCP: Maury Dus, MD  Admit date: 10/28/2018 Discharge date: 10/31/2018  Admitted From: Home Disposition:  Home  Discharge Condition:Stable CODE STATUS:FULL Diet recommendation:  Regular  Brief/Interim Summary: Patient is a 58 year old male with history of recently diagnosed glioblastoma multiforme, status post craniotomy and resection on 09/29/2018 who presented to the emergency department with confusion, altered mental status CT head showed postoperative changes consistent with craniotomy, resection.  MRI of the brain showed marked worsening pattern of the left occipital/posterior parietal region suggesting progression of underlying malignancy, increased  enhancing mass and increasing local edema.  Neurology consulted.  Started on [place] for seizures and dexamethasone for brain edema.    Currently he is back to his baseline mental status, alert and oriented.  He will follow-up with neurology and neuro-oncology as an outpatient.  He is hemodynamically stable for discharge today to home.  Following problems were addressed during his hospitalization: Acute metabolic encephalopathy: Resolved.Could be secondary to worsening brain malignancy  versus edema versus new onset seizures.  Started on  Keppra.  Neurology was following.  Started on  Decadron.     This morning he was completely alert and oriented.  Hemodynamically stable.  Does not have any focal neurological deficits.  MRI findings as above.   EEG did not show any clear epileptiform discharge or  Activity. He will follow-up with neurology neuro-oncology as an outpatient.  Recently diagnosed left occipital glioblastoma: Diagnosed in August 2020.  Underwent left parietal-hospital craniotomy and resection of GBM on 09/29/2018 by neurosurgery.  Patient had been weaned off steroids and antiepileptics.  He is currently on Temodar along with radiation  therapy.  Neuro oncology recommending 6 weeks of intensity moderated XRT.  Underwent CT simulation for XRT on 9/21.   BPH: Continue finesteride   Discharge Diagnoses:  Principal Problem:   Acute metabolic encephalopathy Active Problems:   Glioblastoma ([place])   Primary cancer of occipital lobe (HCC)   BPH (benign prostatic hyperplasia)    Discharge Instructions  Discharge Instructions    Ambulatory referral to Neurology   Complete by: As directed    An appointment is requested in approximately: 4 weeks   Diet - low sodium heart healthy   Complete by: As directed    Discharge instructions   Complete by: As directed    1)Please follow up with Dr. Mickeal Skinner on Thursday. 2)Take prescribed medications as instructed. 3)Follow up with neurology in 4 weeks . Name and Number the provider group has been attached. 4)Per [facility] statutes, patients with seizures are not allowed to drive until  they have been seizure-free for six months. Use caution when using heavy equipment or power tools. Avoid working on ladders or at heights. Take showers instead of baths. Ensure the water temperature is not too high on the home water heater. Do not go swimming alone. When caring for infants or small children, sit down when holding, feeding, or changing them to minimize risk of injury to the child in the event you have a seizure.   Increase activity slowly   Complete by: As directed      Allergies as of 10/31/2018   No Known Allergies     Medication List    TAKE these medications   dexamethasone 4 MG tablet Commonly known as: Decadron Take 1 tablet (4 mg total) by mouth daily.   finasteride 5 MG tablet Commonly known as: PROSCAR Take 5 mg by mouth daily.  levETIRAcetam 500 MG tablet Commonly known as: KEPPRA Take 1 tablet (500 mg total) by mouth 2 (two) times daily.   ondansetron 8 MG tablet Commonly known as: Zofran Take 1 tablet (8 mg total) by mouth 2 (two) times daily as  needed (nausea and vomiting). May take 30-60 minutes prior to Temodar administration if nausea/vomiting occurs.   temozolomide 140 MG capsule Commonly known as: TEMODAR Take 1 capsule (140 mg total) by mouth daily. May take on an empty stomach to decrease nausea & vomiting.      Follow-up Information    Vaslow, Acey Lav, MD. Schedule an appointment as soon as possible for a visit in 1 week(s).   Specialties: Psychiatry, Neurology, Oncology Why: Make appointment on Thursday Contact information: [place] [address] [phone]          No Known Allergies  Consultations:  Neurology   Procedures/Studies: Ct Head Wo Contrast  Result Date: 10/28/2018 CLINICAL DATA:  Confusion, history of brain malignancy EXAM: CT HEAD WITHOUT CONTRAST TECHNIQUE: Contiguous axial images were obtained from the base of the skull through the vertex without intravenous contrast. COMPARISON:  MR September 30, 2018, CT head September 27, 2018 FINDINGS: Brain: There is a region of gliosis involving the left occipital and parietal lobes with postsurgical changes of prior craniotomy and resection of the previously seen necrotic mass. Some residual heterogeneity remains at the resection site, incompletely assessed on this unenhanced CT. Mild overlying hyperattenuating dural thickening is present as well which can be an expected finding post resection. Mild residual effacement of the left hemispheric sulci. No evidence of acute infarction, hemorrhage, hydrocephalus, or new extra-axial collection. Vascular: No hyperdense vessel or unexpected calcification. Skull: Post craniotomy findings, as above with mild overlying soft tissue infiltration. No large collection. No acute osseous abnormality or suspicious osseous lesion. Sinuses/Orbits: Paranasal sinuses and mastoid air cells are predominantly clear. Included orbital structures are unremarkable. Other: None IMPRESSION: 1. Postsurgical changes of prior  craniotomy and resection of the previously seen necrotic mass in the left occipital and parietal lobes. Some residual heterogeneity remains at the resection site, incompletely assessed on this unenhanced CT. Consider contrast-enhanced MRI for further evaluation and comparison to postsurgical baseline imaging. 2. No other acute intracranial abnormality. Electronically Signed   By: Lovena Le M.D.   On: 10/28/2018 19:59   Mr Jeri Cos And Wo Contrast  Result Date: 10/28/2018 CLINICAL DATA:  Altered level of consciousness. Previous brain tumor resection. EXAM: MRI HEAD WITHOUT AND WITH CONTRAST TECHNIQUE: Multiplanar, multiecho pulse sequences of the brain and surrounding structures were obtained without and with intravenous contrast. CONTRAST:  92mL GADAVIST GADOBUTROL 1 MMOL/ML IV SOLN COMPARISON:  Head CT 10/28/2018.  MRI 09/30/2018. FINDINGS: Brain: Worsened pattern with increased mass effect and edema in the left occipital and parietal brain. Areas increasing enhancement in the occipital and parietal region probably represent worsening of residual tumor. Cannot rule out some component of pseudo progression. No evidence of significant hemorrhage, hydrocephalus or extra-axial fluid collection. Vascular: Major vessels at the base of the brain show flow. Skull and upper cervical spine: Otherwise negative Sinuses/Orbits: Clear/normal Other: None IMPRESSION: Marked worsening of the pattern in the left occipital and posterior parietal region, most consistent with progression of the underlying malignancy with increase in the amount of enhancing mass and increase in regional edema. There could be some element of pseudoprogression, but I think the majority represents true progression. Electronically Signed   By: Nelson Chimes M.D.   On:  10/28/2018 22:37   Dg Chest Port 1 View  Result Date: 10/28/2018 CLINICAL DATA:  Confusion and altered mental status. EXAM: PORTABLE CHEST 1 VIEW COMPARISON:  Chest CT 09/27/2018  FINDINGS: Low lung volumes.The cardiomediastinal contours are normal. Minor basilar atelectasis. Pulmonary vasculature is normal. No consolidation, pleural effusion, or pneumothorax. No acute osseous abnormalities are seen. IMPRESSION: Low lung volumes with mild bibasilar atelectasis. Electronically Signed   By: Keith Rake M.D.   On: 10/28/2018 19:33       Subjective: Patient seen and examined the bedside this morning.  Hemodynamically stable for discharge.  Discharge Exam: Vitals:   10/31/18 0758 10/31/18 1221  BP: 126/69 132/79  Pulse: (!) 46 (!) 44  Resp: 17 18  Temp: (!) 97.4 F (36.3 C) 97.7 F (36.5 C)  SpO2: 100% 100%   Vitals:   10/30/18 2300 10/31/18 0438 10/31/18 0758 10/31/18 1221  BP: 122/72 115/72 126/69 132/79  Pulse: 65 67 (!) 46 (!) 44  Resp: 18 18 17 18   Temp: 98 F (36.7 C) 98.4 F (36.9 C) (!) 97.4 F (36.3 C) 97.7 F (36.5 C)  TempSrc: Oral Oral Oral Oral  SpO2: 99% 99% 100% 100%  Weight:      Height:        General: Pt is alert, awake, not in acute distress Cardiovascular: RRR, S1/S2 +, no rubs, no gallops Respiratory: CTA bilaterally, no wheezing, no rhonchi Abdominal: Soft, NT, ND, bowel sounds + Extremities: no edema, no cyanosis    The results of significant diagnostics from this hospitalization (including imaging, microbiology, ancillary and laboratory) are listed below for reference.     Microbiology: Recent Results (from the past 240 hour(s))  SARS Coronavirus 2 [facility] order, Performed in [facility] hospital lab) Nasopharyngeal Nasopharyngeal Swab     Status: None   Collection Time: 10/28/18  8:08 PM   Specimen: Nasopharyngeal Swab  Result Value Ref Range Status   SARS Coronavirus 2 NEGATIVE NEGATIVE Final    Comment: (NOTE) If result is NEGATIVE SARS-CoV-2 target nucleic acids are NOT DETECTED. The SARS-CoV-2 RNA is generally detectable in upper and lower  respiratory specimens during the acute phase of infection. The  lowest  concentration of SARS-CoV-2 viral copies this assay can detect is 250  copies / mL. A negative result does not preclude SARS-CoV-2 infection  and should not be used as the sole basis for treatment or other  patient management decisions.  A negative result may occur with  improper specimen collection / handling, submission of specimen other  than nasopharyngeal swab, presence of viral mutation(s) within the  areas targeted by this assay, and inadequate number of viral copies  (<250 copies / mL). A negative result must be combined with clinical  observations, patient history, and epidemiological information. If result is POSITIVE SARS-CoV-2 target nucleic acids are DETECTED. The SARS-CoV-2 RNA is generally detectable in upper and lower  respiratory specimens dur ing the acute phase of infection.  Positive  results are indicative of active infection with SARS-CoV-2.  Clinical  correlation with patient history and other diagnostic information is  necessary to determine patient infection status.  Positive results do  not rule out bacterial infection or co-infection with other viruses. If result is PRESUMPTIVE POSTIVE SARS-CoV-2 nucleic acids MAY BE PRESENT.   A presumptive positive result was obtained on the submitted specimen  and confirmed on repeat testing.  While 2019 novel coronavirus  (SARS-CoV-2) nucleic acids may be present in the submitted sample  additional confirmatory testing  may be necessary for epidemiological  and / or clinical management purposes  to differentiate between  SARS-CoV-2 and other Sarbecovirus currently known to infect humans.  If clinically indicated additional testing with an alternate test  methodology [phone]) is advised. The SARS-CoV-2 RNA is generally  detectable in upper and lower respiratory sp ecimens during the acute  phase of infection. The expected result is Negative. Fact Sheet for Patients:   StrictlyIdeas.no Fact Sheet for Healthcare Providers: BankingDealers.co.za This test is not yet approved or cleared by the [place] FDA and has been authorized for detection and/or diagnosis of SARS-CoV-2 by FDA under an Emergency Use Authorization (EUA).  This EUA will remain in effect (meaning this test can be used) for the duration of the COVID-19 declaration under Section 564(b)(1) of the Act, 21 U.S.C. section 360bbb-3(b)(1), unless the authorization is terminated or revoked sooner. Performed at [facility], [place] [address], [place], [place] [address]      Labs: BNP (last 3 results) No results for input(s): BNP in the last 8760 hours. Basic Metabolic Panel: Recent Labs  Lab 10/28/18 2004 10/29/18 0608  NA 139 138  K 4.2 4.4  CL 105 106  CO2 26 26  GLUCOSE 113* 167*  BUN 16 14  CREATININE 1.40* 1.18  CALCIUM 8.7* 8.8*   Liver Function Tests: Recent Labs  Lab 10/28/18 2004  AST 20  ALT 18  ALKPHOS 99  BILITOT 0.6  PROT 6.6  ALBUMIN 3.9   No results for input(s): LIPASE, AMYLASE in the last 168 hours. Recent Labs  Lab 10/28/18 2004  AMMONIA 10   CBC: Recent Labs  Lab 10/28/18 2004  WBC 7.4  NEUTROABS 6.0  HGB 13.1  HCT 39.0  MCV 84.4  PLT 175   Cardiac Enzymes: No results for input(s): CKTOTAL, CKMB, CKMBINDEX, TROPONINI in the last 168 hours. BNP: Invalid input(s): POCBNP CBG: No results for input(s): GLUCAP in the last 168 hours. D-Dimer No results for input(s): DDIMER in the last 72 hours. Hgb A1c No results for input(s): HGBA1C in the last 72 hours. Lipid Profile No results for input(s): CHOL, HDL, LDLCALC, TRIG, CHOLHDL, LDLDIRECT in the last 72 hours. Thyroid function studies No results for input(s): TSH, T4TOTAL, T3FREE, THYROIDAB in the last 72 hours.  Invalid input(s): FREET3 Anemia work up No results for input(s): VITAMINB12, FOLATE, FERRITIN, TIBC, IRON,  RETICCTPCT in the last 72 hours. Urinalysis    Component Value Date/Time   COLORURINE YELLOW 10/28/2018 1904   APPEARANCEUR CLEAR 10/28/2018 1904   LABSPEC 1.014 10/28/2018 1904   PHURINE 5.0 10/28/2018 1904   GLUCOSEU NEGATIVE 10/28/2018 1904   HGBUR NEGATIVE 10/28/2018 1904   BILIRUBINUR NEGATIVE 10/28/2018 1904   KETONESUR NEGATIVE 10/28/2018 1904   PROTEINUR NEGATIVE 10/28/2018 1904   NITRITE NEGATIVE 10/28/2018 1904   LEUKOCYTESUR NEGATIVE 10/28/2018 1904   Sepsis Labs Invalid input(s): PROCALCITONIN,  WBC,  LACTICIDVEN Microbiology Recent Results (from the past 240 hour(s))  SARS Coronavirus 2 [facility] order, Performed in [facility] hospital lab) Nasopharyngeal Nasopharyngeal Swab     Status: None   Collection Time: 10/28/18  8:08 PM   Specimen: Nasopharyngeal Swab  Result Value Ref Range Status   SARS Coronavirus 2 NEGATIVE NEGATIVE Final    Comment: (NOTE) If result is NEGATIVE SARS-CoV-2 target nucleic acids are NOT DETECTED. The SARS-CoV-2 RNA is generally detectable in upper and lower  respiratory specimens during the acute phase of infection. The lowest  concentration of SARS-CoV-2 viral copies this assay can  detect is 250  copies / mL. A negative result does not preclude SARS-CoV-2 infection  and should not be used as the sole basis for treatment or other  patient management decisions.  A negative result may occur with  improper specimen collection / handling, submission of specimen other  than nasopharyngeal swab, presence of viral mutation(s) within the  areas targeted by this assay, and inadequate number of viral copies  (<250 copies / mL). A negative result must be combined with clinical  observations, patient history, and epidemiological information. If result is POSITIVE SARS-CoV-2 target nucleic acids are DETECTED. The SARS-CoV-2 RNA is generally detectable in upper and lower  respiratory specimens dur ing the acute phase of infection.  Positive   results are indicative of active infection with SARS-CoV-2.  Clinical  correlation with patient history and other diagnostic information is  necessary to determine patient infection status.  Positive results do  not rule out bacterial infection or co-infection with other viruses. If result is PRESUMPTIVE POSTIVE SARS-CoV-2 nucleic acids MAY BE PRESENT.   A presumptive positive result was obtained on the submitted specimen  and confirmed on repeat testing.  While 2019 novel coronavirus  (SARS-CoV-2) nucleic acids may be present in the submitted sample  additional confirmatory testing may be necessary for epidemiological  and / or clinical management purposes  to differentiate between  SARS-CoV-2 and other Sarbecovirus currently known to infect humans.  If clinically indicated additional testing with an alternate test  methodology [phone]) is advised. The SARS-CoV-2 RNA is generally  detectable in upper and lower respiratory sp ecimens during the acute  phase of infection. The expected result is Negative. Fact Sheet for Patients:  StrictlyIdeas.no Fact Sheet for Healthcare Providers: BankingDealers.co.za This test is not yet approved or cleared by the [place] FDA and has been authorized for detection and/or diagnosis of SARS-CoV-2 by FDA under an Emergency Use Authorization (EUA).  This EUA will remain in effect (meaning this test can be used) for the duration of the COVID-19 declaration under Section 564(b)(1) of the Act, 21 U.S.C. section 360bbb-3(b)(1), unless the authorization is terminated or revoked sooner. Performed at [facility], [place] [address], [place], [place] [address]     Please note: You were cared for by a hospitalist during your hospital stay. Once you are discharged, your primary care physician will handle any further medical issues. Please note that NO REFILLS for any discharge medications  will be authorized once you are discharged, as it is imperative that you return to your primary care physician (or establish a relationship with a primary care physician if you do not have one) for your post hospital discharge needs so that they can reassess your need for medications and monitor your lab values.    Time coordinating discharge: 40 minutes  SIGNED:   Shelly Coss, MD  [facility] 10/31/2018, 1:09 PM Pager [phone]  If 7PM-7AM, please contact night-coverage [email or web address] Password [email or web address]

## 2018-10-31 NOTE — Progress Notes (Addendum)
NEUROLOGY PROGRESS NOTE  Subjective: Patient at this time is in good spirits.  He is alert and oriented.  Desiring to leave hospital        Exam: Vitals:   10/31/18 0438 10/31/18 0758  BP: 115/72 126/69  Pulse: 67 (!) 46  Resp: 18 17  Temp: 98.4 F (36.9 C) (!) 97.4 F (36.3 C)  SpO2: 99% 100%    Physical Exam   HEENT-  Normocephalic, no lesions, without obvious abnormality.  Normal external eye and conjunctiva.   Extremities- Warm, dry and intact Musculoskeletal-no joint tenderness, deformity or swelling Skin-warm and dry, no hyperpigmentation, vitiligo, or suspicious lesions    Neuro:  Mental Status: Alert, oriented, thought content appropriate.  Speech fluent without evidence of aphasia.  Able to follow 3 step commands without difficulty. Cranial Nerves: II:  Difficulty with finger counting in right hemifield III,IV, VI: ptosis not present, extra-ocular motions intact bilaterally pupils equal, round, reactive to light and accommodation V,VII: smile symmetric, when talking and noticed that the right aspect of his mouth does not move is much of his left.  Has decreased nasolabial fold on the right VIII: hearing normal bilaterally I Motor: Right : Upper extremity   5/5    Left:     Upper extremity   5/5  Lower extremity   5/5     Lower extremity   5/5 Tone and bulk:normal tone throughout; no atrophy noted Sensory: Pinprick and light touch intact throughout, bilaterally Deep Tendon Reflexes: 2+ and symmetric throughout    Medications:  Scheduled: . dexamethasone (DECADRON) injection  4 mg Intravenous Q6H  . finasteride  5 mg Oral Daily  . levETIRAcetam  500 mg Oral BID  . sodium chloride flush  3 mL Intravenous Q12H  . tamsulosin  0.4 mg Oral QPC breakfast   Continuous: . sodium chloride     [id number] chloride, ondansetron, sodium chloride flush  Pertinent Labs/Diagnostics: MRI brain shows worsening pattern with increased mass-effect and edema in the left  occipital parietal lobe of the brain.  Areas of increased enhancement in the occipital parietal region probably represent worsening of residual tumor.  Cannot rule out some component of pseudo-progression.  EEG: This is an abnormal electroencephalogram secondary to findings consistent with left breach rhythm which is consistent with patient's history  Etta Quill PA-C [facility] [phone]   Assessment: MRI findings show worsening pattern with increased mass of residual tumor.  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  Possible also that steroids played some role in improvement.   Impression:  - Worsening of residual tumor - Likely seizure on initial presentation in ED  Recommendations: -Continue Keppra 500 mg twice daily -Patient to follow-up with neurology as outpatient for seizure --Per [facility] statutes, patients with seizures are not allowed to drive until  they have been seizure-free for six months. Use caution when using heavy equipment or power tools. Avoid working on ladders or at heights. Take showers instead of baths. Ensure the water temperature is not too high on the home water heater. Do not go swimming alone. When caring for infants or small children, sit down when holding, feeding, or changing them to minimize risk of injury to the child in the event you have a seizure.   Also, Maintain good sleep hygiene. Avoid alcohol.     Roland Rack, MD [facility] [phone]  If 7pm- 7am, please page neurology on call as listed in  AMION.  10/31/2018, 10:22 AM

## 2018-10-31 NOTE — TOC Transition Note (Signed)
Transition of Care [facility]) - CM/SW Discharge Note   Patient Details  Name: Cody [name] MRN: [id number] Date of Birth: [birth year]  Transition of Care [facility] CM/SW Contact:  Pollie Friar, RN Phone Number: 10/31/2018, 2:20 PM   Clinical Narrative:    Pt is discharging home with self care. Pt has hospital f/u, insurance and transportation home.    Final next level of care: Home/Self Care Barriers to Discharge: No Barriers Identified   Patient Goals and CMS Choice        Discharge Placement                       Discharge Plan and Services                                     Social Determinants of Health (SDOH) Interventions     Readmission Risk Interventions No flowsheet data found.

## 2018-11-02 ENCOUNTER — Other Ambulatory Visit: Payer: Self-pay

## 2018-11-02 ENCOUNTER — Ambulatory Visit
Admission: RE | Admit: 2018-11-02 | Discharge: 2018-11-02 | Disposition: A | Discharge status: Home / Self Care | Payer: BC Managed Care – PPO | Source: Ambulatory Visit | Attending: Radiation Oncology | Admitting: Radiation Oncology

## 2018-11-02 DIAGNOSIS — Z510 Encounter for antineoplastic radiation therapy: Secondary | ICD-10-CM | POA: Diagnosis present

## 2018-11-02 DIAGNOSIS — C714 Malignant neoplasm of occipital lobe: Secondary | ICD-10-CM | POA: Diagnosis present

## 2018-11-03 ENCOUNTER — Other Ambulatory Visit: Payer: Self-pay

## 2018-11-03 ENCOUNTER — Inpatient Hospital Stay: Payer: BC Managed Care – PPO | Attending: Internal Medicine | Admitting: Internal Medicine

## 2018-11-03 ENCOUNTER — Ambulatory Visit
Admission: RE | Admit: 2018-11-03 | Discharge: 2018-11-03 | Disposition: A | Discharge status: Home / Self Care | Payer: BC Managed Care – PPO | Source: Ambulatory Visit | Attending: Radiation Oncology | Admitting: Radiation Oncology

## 2018-11-03 VITALS — BP 130/77 | HR 56 | Temp 98.3°F | Resp 16

## 2018-11-03 DIAGNOSIS — Z23 Encounter for immunization: Secondary | ICD-10-CM | POA: Diagnosis not present

## 2018-11-03 DIAGNOSIS — C719 Malignant neoplasm of brain, unspecified: Secondary | ICD-10-CM | POA: Diagnosis not present

## 2018-11-03 DIAGNOSIS — C714 Malignant neoplasm of occipital lobe: Secondary | ICD-10-CM | POA: Diagnosis present

## 2018-11-03 DIAGNOSIS — Z510 Encounter for antineoplastic radiation therapy: Secondary | ICD-10-CM | POA: Insufficient documentation

## 2018-11-03 DIAGNOSIS — G40909 Epilepsy, unspecified, not intractable, without status epilepticus: Secondary | ICD-10-CM | POA: Insufficient documentation

## 2018-11-03 DIAGNOSIS — N400 Enlarged prostate without lower urinary tract symptoms: Secondary | ICD-10-CM | POA: Diagnosis not present

## 2018-11-03 DIAGNOSIS — Z79899 Other long term (current) drug therapy: Secondary | ICD-10-CM | POA: Diagnosis not present

## 2018-11-03 DIAGNOSIS — R404 Transient alteration of awareness: Secondary | ICD-10-CM | POA: Diagnosis not present

## 2018-11-03 MED ORDER — INFLUENZA VAC SPLIT QUAD 0.5 ML IM SUSY
PREFILLED_SYRINGE | INTRAMUSCULAR | Status: AC
  Filled 2018-11-03: qty 0.5

## 2018-11-03 MED ORDER — INFLUENZA VAC SPLIT QUAD 0.5 ML IM SUSY
0.5000 mL | PREFILLED_SYRINGE | Freq: Once | INTRAMUSCULAR | Status: AC
  Administered 2018-11-03: 0.5 mL via INTRAMUSCULAR

## 2018-11-03 NOTE — Progress Notes (Signed)
[facility] at [address] [place], [place] [address] [phone]   Interval Evaluation  Date of Service: 11/03/18 Patient Name: Cody [name] Patient MRN: [id number] Patient DOB: Jul 20, 1960 Provider: Ventura Sellers, MD  Identifying Statement:  Cody [name] is a 58 y.o. male with left occipital glioblastoma   Oncologic History: Oncology History  Glioblastoma ([facility])  09/29/2018 Surgery   Craniotomy, resection with Dr. Kathyrn Sheriff   10/21/2018 -  Chemotherapy   The patient had [No matching medication found in this treatment plan]  for chemotherapy treatment.      Biomarkers:  MGMT Unknown.  IDH 1/2 Wild type.  EGFR Unknown  TERT Unknown   Interval History:  Cody [name] presents today for follow up after hospitalization this past weekend.  He experienced sudden onset confusion and disorientation, noticed by family, and was evaluated and treated for first ever seizure.  He returned to baseline after 1 day of Keppra and Decadron.  At this time he is at prior baseline without complaint.  No further seizure episodes since discharge.  Tolerated radiation well yesterday and today.  H+P (10/18/18) Patient presented to medical attention in August 2020 with several weeks history of new onset headaches.  Progressive nature of symptoms led to an ED admission, where CNS imaging demonstrated enhancing mass in the left occipital lobe.  He underwent craniotomy and resection with Dr. Kathyrn Sheriff on 08/01/50 without complication.  He was discharged to home with small visual field impairment and otherwise functionally intact.  He denies headaches or any seizures since surgery, having completed decadron taper.    Medications: Current Outpatient Medications on File Prior to Visit  Medication Sig Dispense Refill  . dexamethasone (DECADRON) 4 MG tablet Take 1 tablet (4 mg total) by mouth daily. 30 tablet 0  . finasteride (PROSCAR) 5 MG tablet Take  5 mg by mouth daily.    [name] levETIRAcetam (KEPPRA) 500 MG tablet Take 1 tablet (500 mg total) by mouth 2 (two) times daily. 60 tablet 0  . temozolomide (TEMODAR) 140 MG capsule Take 1 capsule (140 mg total) by mouth daily. May take on an empty stomach to decrease nausea & vomiting. 42 capsule 0  . ondansetron (ZOFRAN) 8 MG tablet Take 1 tablet (8 mg total) by mouth 2 (two) times daily as needed (nausea and vomiting). May take 30-60 minutes prior to Temodar administration if nausea/vomiting occurs. (Patient not taking: Reported on 11/03/2018) 30 tablet 1   No current facility-administered medications on file prior to visit.     Allergies: No Known Allergies Past Medical History:  Past Medical History:  Diagnosis Date  . Enlarged prostate    Past Surgical History:  Past Surgical History:  Procedure Laterality Date  . APPLICATION OF CRANIAL NAVIGATION Left 09/29/2018   Procedure: APPLICATION OF CRANIAL NAVIGATION;  Surgeon: [name], MD;  Location: [facility];  Service: Neurosurgery;  Laterality: Left;  . CRANIOTOMY Left 09/29/2018   Procedure: Sterotactic Left craniotomy for resection of tumor;  Surgeon: [name], MD;  Location: [facility];  Service: Neurosurgery;  Laterality: Left;  Sterotactic Left craniotomy for resection of tumor   Social History:  Social History   Socioeconomic History  . Marital status: Married    Spouse name: Not on file  . Number of children: Not on file  . Years of education: Not on file  . Highest education level: Not on file  Occupational History  . Not on file  Social Needs  .  Financial resource strain: Not on file  . Food insecurity    Worry: Not on file    Inability: Not on file  . Transportation needs    Medical: Yes    Non-medical: No  Tobacco Use  . Smoking status: Never Smoker  . Smokeless tobacco: Never Used  Substance and Sexual Activity  . Alcohol use: Never    Frequency: Never  . Drug use: Never  . Sexual activity: Not on file   Lifestyle  . Physical activity    Days per week: Not on file    Minutes per session: Not on file  . Stress: Not on file  Relationships  . Social Herbalist on phone: Not on file    Gets together: Not on file    Attends religious service: Not on file    Active member of club or organization: Not on file    Attends meetings of clubs or organizations: Not on file    Relationship status: Not on file  . Intimate partner violence    Fear of current or ex partner: No    Emotionally abused: No    Physically abused: No    Forced sexual activity: No  Other Topics Concern  . Not on file  Social History Narrative  . Not on file   Family History: No family history on file.  Review of Systems: Constitutional: Denies fevers, chills or abnormal weight loss Eyes: Denies blurriness of vision Ears, nose, mouth, throat, and face: Denies mucositis or sore throat Respiratory: Denies cough, dyspnea or wheezes Cardiovascular: Denies palpitation, chest discomfort or lower extremity swelling Gastrointestinal:  Denies nausea, constipation, diarrhea GU: Denies dysuria or incontinence Skin: Denies abnormal skin rashes Neurological: Per HPI Musculoskeletal: Denies joint pain, back or neck discomfort. No decrease in ROM Behavioral/Psych: Denies anxiety, disturbance in thought content, and mood instability  Physical Exam: Vitals:   11/03/18 0900  BP: 130/77  Pulse: (!) 56  Resp: 16  Temp: 98.3 F (36.8 C)  SpO2: 100%   KPS: 80. General: Alert, cooperative, pleasant, in no acute distress Head: Craniotomy scar noted, dry and intact. EENT: No conjunctival injection or scleral icterus. Oral mucosa moist Lungs: Resp effort normal Cardiac: Regular rate and rhythm Abdomen: Soft, non-distended abdomen Skin: No rashes cyanosis or petechiae. Extremities: No clubbing or edema  Neurologic Exam: Mental Status: Awake, alert, attentive to examiner. Oriented to self and environment. Language is  fluent with intact comprehension.  Cranial Nerves: Visual acuity is grossly normal. Right inferior quadrantanopia. Extra-ocular movements intact. No ptosis. Face is symmetric, tongue midline. Motor: Tone and bulk are normal. Power is full in both arms and legs. Reflexes are symmetric, no pathologic reflexes present. Intact finger to nose bilaterally Sensory: Intact to light touch and temperature Gait: Normal and tandem gait is impaired.   Labs: I have reviewed the data as listed    Component Value Date/Time   NA 138 10/29/2018 0608   K 4.4 10/29/2018 0608   CL 106 10/29/2018 0608   CO2 26 10/29/2018 0608   GLUCOSE 167 (H) 10/29/2018 0608   BUN 14 10/29/2018 0608   CREATININE 1.18 10/29/2018 0608   CALCIUM 8.8 (L) 10/29/2018 0608   PROT 6.6 10/28/2018 2004   ALBUMIN 3.9 10/28/2018 2004   AST 20 10/28/2018 2004   ALT 18 10/28/2018 2004   ALKPHOS 99 10/28/2018 2004   BILITOT 0.6 10/28/2018 2004   GFRNONAA >60 10/29/2018 0608   GFRAA >60 10/29/2018 9937   Lab  Results  Component Value Date   WBC 7.4 10/28/2018   NEUTROABS 6.0 10/28/2018   HGB 13.1 10/28/2018   HCT 39.0 10/28/2018   MCV 84.4 10/28/2018   PLT 175 10/28/2018    Imaging:  Ct Head Wo Contrast  Result Date: 10/28/2018 CLINICAL DATA:  Confusion, history of brain malignancy EXAM: CT HEAD WITHOUT CONTRAST TECHNIQUE: Contiguous axial images were obtained from the base of the skull through the vertex without intravenous contrast. COMPARISON:  MR September 30, 2018, CT head September 27, 2018 FINDINGS: Brain: There is a region of gliosis involving the left occipital and parietal lobes with postsurgical changes of prior craniotomy and resection of the previously seen necrotic mass. Some residual heterogeneity remains at the resection site, incompletely assessed on this unenhanced CT. Mild overlying hyperattenuating dural thickening is present as well which can be an expected finding post resection. Mild residual effacement of the  left hemispheric sulci. No evidence of acute infarction, hemorrhage, hydrocephalus, or new extra-axial collection. Vascular: No hyperdense vessel or unexpected calcification. Skull: Post craniotomy findings, as above with mild overlying soft tissue infiltration. No large collection. No acute osseous abnormality or suspicious osseous lesion. Sinuses/Orbits: Paranasal sinuses and mastoid air cells are predominantly clear. Included orbital structures are unremarkable. Other: None IMPRESSION: 1. Postsurgical changes of prior craniotomy and resection of the previously seen necrotic mass in the left occipital and parietal lobes. Some residual heterogeneity remains at the resection site, incompletely assessed on this unenhanced CT. Consider contrast-enhanced MRI for further evaluation and comparison to postsurgical baseline imaging. 2. No other acute intracranial abnormality. Electronically Signed   By: Lovena Le M.D.   On: 10/28/2018 19:59   Mr Jeri Cos And Wo Contrast  Result Date: 10/28/2018 CLINICAL DATA:  Altered level of consciousness. Previous brain tumor resection. EXAM: MRI HEAD WITHOUT AND WITH CONTRAST TECHNIQUE: Multiplanar, multiecho pulse sequences of the brain and surrounding structures were obtained without and with intravenous contrast. CONTRAST:  52m GADAVIST GADOBUTROL 1 MMOL/ML IV SOLN COMPARISON:  Head CT 10/28/2018.  MRI 09/30/2018. FINDINGS: Brain: Worsened pattern with increased mass effect and edema in the left occipital and parietal brain. Areas increasing enhancement in the occipital and parietal region probably represent worsening of residual tumor. Cannot rule out some component of pseudo progression. No evidence of significant hemorrhage, hydrocephalus or extra-axial fluid collection. Vascular: Major vessels at the base of the brain show flow. Skull and upper cervical spine: Otherwise negative Sinuses/Orbits: Clear/normal Other: None IMPRESSION: Marked worsening of the pattern in the  left occipital and posterior parietal region, most consistent with progression of the underlying malignancy with increase in the amount of enhancing mass and increase in regional edema. There could be some element of pseudoprogression, but I think the majority represents true progression. Electronically Signed   By: MNelson ChimesM.D.   On: 10/28/2018 22:37   Dg Chest Port 1 View  Result Date: 10/28/2018 CLINICAL DATA:  Confusion and altered mental status. EXAM: PORTABLE CHEST 1 VIEW COMPARISON:  Chest CT 09/27/2018 FINDINGS: Low lung volumes.The cardiomediastinal contours are normal. Minor basilar atelectasis. Pulmonary vasculature is normal. No consolidation, pleural effusion, or pneumothorax. No acute osseous abnormalities are seen. IMPRESSION: Low lung volumes with mild bibasilar atelectasis. Electronically Signed   By: MKeith RakeM.D.   On: 10/28/2018 19:33     Assessment/Plan Need for immunization against influenza - Plan: influenza vac split quadrivalent PF (FLUARIX) injection 0.5 mL  Glioblastoma ([facility]  Mr. RBrunettois clinically improved following breakthrough seizure last week.  We recommended he continue Keppra 538m BID and reduce decadron to 214mdaily.  We provided counseling on epilepsy and epilepsy safety including Cody [name] driving restrictions.  We appreciate the opportunity to participate in the care of Cody [name]   We recommended continuing treatment with 6 weeks of intensity modulated radiation therapy and concurrent temozolomide dosed at 7559m2 daily.    Chemotherapy should be held for the following:  ANC less than 1,000  Platelets less than 100,000  LFT or creatinine greater than 2x ULN  If clinical concerns/contraindications develop  He will return for follow up during weeks 2, 4 and 6 of IMRT with labs for evaluation.  All questions were answered. The patient knows to call the clinic with any problems, questions or concerns. No barriers to  learning were detected.  The total time spent in the encounter was 25 minutes and more than 50% was on counseling and review of test results   ZacVentura SellersD Medical Director of Neuro-Oncology [facility] [place]/01/20 4:57 PM

## 2018-11-04 ENCOUNTER — Telehealth: Payer: Self-pay | Admitting: Internal Medicine

## 2018-11-04 ENCOUNTER — Ambulatory Visit
Admission: RE | Admit: 2018-11-04 | Discharge: 2018-11-04 | Disposition: A | Discharge status: Home / Self Care | Payer: BC Managed Care – PPO | Source: Ambulatory Visit | Attending: Radiation Oncology | Admitting: Radiation Oncology

## 2018-11-04 ENCOUNTER — Other Ambulatory Visit: Payer: Self-pay

## 2018-11-04 DIAGNOSIS — Z510 Encounter for antineoplastic radiation therapy: Secondary | ICD-10-CM | POA: Diagnosis not present

## 2018-11-04 NOTE — Telephone Encounter (Signed)
No los per 10/1.

## 2018-11-07 ENCOUNTER — Other Ambulatory Visit: Payer: Self-pay

## 2018-11-07 ENCOUNTER — Ambulatory Visit
Admission: RE | Admit: 2018-11-07 | Discharge: 2018-11-07 | Disposition: A | Discharge status: Home / Self Care | Payer: BC Managed Care – PPO | Source: Ambulatory Visit | Attending: Radiation Oncology | Admitting: Radiation Oncology

## 2018-11-07 DIAGNOSIS — C719 Malignant neoplasm of brain, unspecified: Secondary | ICD-10-CM

## 2018-11-07 DIAGNOSIS — Z510 Encounter for antineoplastic radiation therapy: Secondary | ICD-10-CM | POA: Diagnosis not present

## 2018-11-07 MED ORDER — SONAFINE EX EMUL
1.0000 "application " | Freq: Once | CUTANEOUS | Status: AC
  Administered 2018-11-07: 1 via TOPICAL

## 2018-11-07 NOTE — Progress Notes (Signed)
Pt here for patient teaching.  Pt given Radiation and You booklet, skin care instructions and Sonafine.  Reviewed areas of pertinence such as fatigue, hair loss, nausea and vomiting, skin changes and headache . Pt able to give teach back of to pat skin, use unscented/gentle soap and drink plenty of water,apply Sonafine bid and avoid applying anything to skin within 4 hours of treatment. Pt verbalizes understanding of information given and will contact nursing with any questions or concerns.     [email or web address].org/treatmentinformation/whattoexpect/index

## 2018-11-08 ENCOUNTER — Other Ambulatory Visit: Payer: Self-pay

## 2018-11-08 ENCOUNTER — Telehealth: Payer: Self-pay | Admitting: Radiation Therapy

## 2018-11-08 ENCOUNTER — Ambulatory Visit
Admission: RE | Admit: 2018-11-08 | Discharge: 2018-11-08 | Disposition: A | Discharge status: Home / Self Care | Payer: BC Managed Care – PPO | Source: Ambulatory Visit | Attending: Radiation Oncology | Admitting: Radiation Oncology

## 2018-11-08 DIAGNOSIS — Z510 Encounter for antineoplastic radiation therapy: Secondary | ICD-10-CM | POA: Diagnosis not present

## 2018-11-08 NOTE — Telephone Encounter (Signed)
Spoke with Mr. Cody [name] to confirm that he is supposed to be taking the Temodar 7 days a week. He expressed understanding and was thankful for the call.  Mont Dutton R.T.(R)(T)

## 2018-11-09 ENCOUNTER — Other Ambulatory Visit: Payer: Self-pay

## 2018-11-09 ENCOUNTER — Ambulatory Visit
Admission: RE | Admit: 2018-11-09 | Discharge: 2018-11-09 | Disposition: A | Discharge status: Home / Self Care | Payer: BC Managed Care – PPO | Source: Ambulatory Visit | Attending: Radiation Oncology | Admitting: Radiation Oncology

## 2018-11-09 DIAGNOSIS — Z510 Encounter for antineoplastic radiation therapy: Secondary | ICD-10-CM | POA: Diagnosis not present

## 2018-11-10 ENCOUNTER — Other Ambulatory Visit: Payer: Self-pay

## 2018-11-10 ENCOUNTER — Ambulatory Visit
Admission: RE | Admit: 2018-11-10 | Discharge: 2018-11-10 | Disposition: A | Discharge status: Home / Self Care | Payer: BC Managed Care – PPO | Source: Ambulatory Visit | Attending: Radiation Oncology | Admitting: Radiation Oncology

## 2018-11-10 DIAGNOSIS — Z510 Encounter for antineoplastic radiation therapy: Secondary | ICD-10-CM | POA: Diagnosis not present

## 2018-11-11 ENCOUNTER — Ambulatory Visit
Admission: RE | Admit: 2018-11-11 | Discharge: 2018-11-11 | Disposition: A | Discharge status: Home / Self Care | Payer: BC Managed Care – PPO | Source: Ambulatory Visit | Attending: Radiation Oncology | Admitting: Radiation Oncology

## 2018-11-11 DIAGNOSIS — Z510 Encounter for antineoplastic radiation therapy: Secondary | ICD-10-CM | POA: Diagnosis not present

## 2018-11-14 ENCOUNTER — Ambulatory Visit
Admission: RE | Admit: 2018-11-14 | Discharge: 2018-11-14 | Disposition: A | Discharge status: Home / Self Care | Payer: BC Managed Care – PPO | Source: Ambulatory Visit | Attending: Radiation Oncology | Admitting: Radiation Oncology

## 2018-11-14 ENCOUNTER — Other Ambulatory Visit: Payer: Self-pay

## 2018-11-14 DIAGNOSIS — Z510 Encounter for antineoplastic radiation therapy: Secondary | ICD-10-CM | POA: Diagnosis not present

## 2018-11-15 ENCOUNTER — Ambulatory Visit
Admission: RE | Admit: 2018-11-15 | Discharge: 2018-11-15 | Disposition: A | Discharge status: Home / Self Care | Payer: BC Managed Care – PPO | Source: Ambulatory Visit | Attending: Radiation Oncology | Admitting: Radiation Oncology

## 2018-11-15 ENCOUNTER — Other Ambulatory Visit: Payer: Self-pay

## 2018-11-15 DIAGNOSIS — Z510 Encounter for antineoplastic radiation therapy: Secondary | ICD-10-CM | POA: Diagnosis not present

## 2018-11-16 ENCOUNTER — Other Ambulatory Visit: Payer: Self-pay

## 2018-11-16 ENCOUNTER — Ambulatory Visit
Admission: RE | Admit: 2018-11-16 | Discharge: 2018-11-16 | Disposition: A | Discharge status: Home / Self Care | Payer: BC Managed Care – PPO | Source: Ambulatory Visit | Attending: Radiation Oncology | Admitting: Radiation Oncology

## 2018-11-16 DIAGNOSIS — Z510 Encounter for antineoplastic radiation therapy: Secondary | ICD-10-CM | POA: Diagnosis not present

## 2018-11-17 ENCOUNTER — Other Ambulatory Visit: Payer: Self-pay

## 2018-11-17 ENCOUNTER — Ambulatory Visit
Admission: RE | Admit: 2018-11-17 | Discharge: 2018-11-17 | Disposition: A | Discharge status: Home / Self Care | Payer: BC Managed Care – PPO | Source: Ambulatory Visit | Attending: Radiation Oncology | Admitting: Radiation Oncology

## 2018-11-17 ENCOUNTER — Inpatient Hospital Stay: Payer: BC Managed Care – PPO

## 2018-11-17 ENCOUNTER — Inpatient Hospital Stay (HOSPITAL_BASED_OUTPATIENT_CLINIC_OR_DEPARTMENT_OTHER): Payer: BC Managed Care – PPO | Admitting: Internal Medicine

## 2018-11-17 VITALS — BP 129/73 | HR 65 | Temp 98.5°F | Resp 17 | Ht 72.0 in | Wt 189.9 lb

## 2018-11-17 DIAGNOSIS — C719 Malignant neoplasm of brain, unspecified: Secondary | ICD-10-CM | POA: Diagnosis not present

## 2018-11-17 DIAGNOSIS — Z510 Encounter for antineoplastic radiation therapy: Secondary | ICD-10-CM | POA: Diagnosis not present

## 2018-11-17 LAB — CBC WITH DIFFERENTIAL (CANCER CENTER ONLY)
Abs Immature Granulocytes: 0.03 10*3/uL (ref 0.00–0.07)
Basophils Absolute: 0 10*3/uL (ref 0.0–0.1)
Basophils Relative: 0 %
Eosinophils Absolute: 0 10*3/uL (ref 0.0–0.5)
Eosinophils Relative: 0 %
HCT: 40.4 % (ref 39.0–52.0)
Hemoglobin: 14.1 g/dL (ref 13.0–17.0)
Immature Granulocytes: 0 %
Lymphocytes Relative: 6 %
Lymphs Abs: 0.6 10*3/uL — ABNORMAL LOW (ref 0.7–4.0)
MCH: 29.4 pg (ref 26.0–34.0)
MCHC: 34.9 g/dL (ref 30.0–36.0)
MCV: 84.2 fL (ref 80.0–100.0)
Monocytes Absolute: 0.3 10*3/uL (ref 0.1–1.0)
Monocytes Relative: 3 %
Neutro Abs: 8.8 10*3/uL — ABNORMAL HIGH (ref 1.7–7.7)
Neutrophils Relative %: 91 %
Platelet Count: 152 10*3/uL (ref 150–400)
RBC: 4.8 MIL/uL (ref 4.22–5.81)
RDW: 14.9 % (ref 11.5–15.5)
WBC Count: 9.7 10*3/uL (ref 4.0–10.5)
nRBC: 0 % (ref 0.0–0.2)

## 2018-11-17 LAB — CMP (CANCER CENTER ONLY)
ALT: 36 U/L (ref 0–44)
AST: 21 U/L (ref 15–41)
Albumin: 3.8 g/dL (ref 3.5–5.0)
Alkaline Phosphatase: 99 U/L (ref 38–126)
Anion gap: 11 (ref 5–15)
BUN: 20 mg/dL (ref 6–20)
CO2: 28 mmol/L (ref 22–32)
Calcium: 8.9 mg/dL (ref 8.9–10.3)
Chloride: 105 mmol/L (ref 98–111)
Creatinine: 1.44 mg/dL — ABNORMAL HIGH (ref 0.61–1.24)
GFR, Est AFR Am: >60 mL/min (ref >60)
GFR, Estimated: 54 mL/min — ABNORMAL LOW (ref >60)
Glucose, Bld: 114 mg/dL — ABNORMAL HIGH (ref 70–99)
Potassium: 4.3 mmol/L (ref 3.5–5.1)
Sodium: 144 mmol/L (ref 135–145)
Total Bilirubin: 0.5 mg/dL (ref 0.3–1.2)
Total Protein: 6.7 g/dL (ref 6.5–8.1)

## 2018-11-17 MED ORDER — DEXAMETHASONE 1 MG PO TABS: 1.0000 mg | ORAL_TABLET | Freq: Every day | ORAL | 0 refills | Status: DC

## 2018-11-17 NOTE — Progress Notes (Signed)
[facility] at [address] [place], [place] [address] [phone]   Interval Evaluation  Date of Service: 11/17/18 Patient Name: Cody [name] Patient MRN: [id number] Patient DOB: 13-Dec-1960 Provider: Ventura Sellers, MD  Identifying Statement:  Cody [name] is a 59 y.o. male with left occipital glioblastoma   Oncologic History: Oncology History  Glioblastoma ([facility])  09/29/2018 Surgery   Craniotomy, resection with Dr. Kathyrn Sheriff   10/21/2018 -  Chemotherapy   The patient had [No matching medication found in this treatment plan]  for chemotherapy treatment.      Biomarkers:  MGMT Unknown.  IDH 1/2 Wild type.  EGFR Unknown  TERT Unknown   Interval History:  Cody [name] presents today for follow up after 2 weeks of radiation therapy and temozolomide.  No further seizures since starting keppra.  No issues tolerating TMZ.  Tolerated radiation well to this point.  H+P (10/18/18) Patient presented to medical attention in August 2020 with several weeks history of new onset headaches.  Progressive nature of symptoms led to an ED admission, where CNS imaging demonstrated enhancing mass in the left occipital lobe.  He underwent craniotomy and resection with Dr. Kathyrn Sheriff on 9/75/88 without complication.  He was discharged to home with small visual field impairment and otherwise functionally intact.  He denies headaches or any seizures since surgery, having completed decadron taper.    Medications: Current Outpatient Medications on File Prior to Visit  Medication Sig Dispense Refill   dexamethasone (DECADRON) 4 MG tablet Take 1 tablet (4 mg total) by mouth daily. 30 tablet 0   finasteride (PROSCAR) 5 MG tablet Take 5 mg by mouth daily.     levETIRAcetam (KEPPRA) 500 MG tablet Take 1 tablet (500 mg total) by mouth 2 (two) times daily. 60 tablet 0   ondansetron (ZOFRAN) 8 MG tablet Take 1 tablet (8 mg total) by mouth 2 (two)  times daily as needed (nausea and vomiting). May take 30-60 minutes prior to Temodar administration if nausea/vomiting occurs. (Patient not taking: Reported on 11/03/2018) 30 tablet 1   temozolomide (TEMODAR) 140 MG capsule Take 1 capsule (140 mg total) by mouth daily. May take on an empty stomach to decrease nausea & vomiting. 42 capsule 0   No current facility-administered medications on file prior to visit.     Allergies: No Known Allergies Past Medical History:  Past Medical History:  Diagnosis Date   Enlarged prostate    Past Surgical History:  Past Surgical History:  Procedure Laterality Date   APPLICATION OF CRANIAL NAVIGATION Left 09/29/2018   Procedure: APPLICATION OF CRANIAL NAVIGATION;  Surgeon: [name], MD;  Location: [facility];  Service: Neurosurgery;  Laterality: Left;   CRANIOTOMY Left 09/29/2018   Procedure: Sterotactic Left craniotomy for resection of tumor;  Surgeon: [name], MD;  Location: [facility];  Service: Neurosurgery;  Laterality: Left;  Sterotactic Left craniotomy for resection of tumor   Social History:  Social History   Socioeconomic History   Marital status: Married    Spouse name: Not on file   Number of children: Not on file   Years of education: Not on file   Highest education level: Not on file  Occupational History   Not on file  Social Needs   Financial resource strain: Not on file   Food insecurity    Worry: Not on file    Inability: Not on file   Transportation needs    Medical: Yes  Non-medical: No  Tobacco Use   Smoking status: Never Smoker   Smokeless tobacco: Never Used  Substance and Sexual Activity   Alcohol use: Never    Frequency: Never   Drug use: Never   Sexual activity: Not on file  Lifestyle   Physical activity    Days per week: Not on file    Minutes per session: Not on file   Stress: Not on file  Relationships   Social connections    Talks on phone: Not on file    Gets together:  Not on file    Attends religious service: Not on file    Active member of club or organization: Not on file    Attends meetings of clubs or organizations: Not on file    Relationship status: Not on file   Intimate partner violence    Fear of current or ex partner: No    Emotionally abused: No    Physically abused: No    Forced sexual activity: No  Other Topics Concern   Not on file  Social History Narrative   Not on file   Family History: No family history on file.  Review of Systems: Constitutional: Denies fevers, chills or abnormal weight loss Eyes: Denies blurriness of vision Ears, nose, mouth, throat, and face: Denies mucositis or sore throat Respiratory: Denies cough, dyspnea or wheezes Cardiovascular: Denies palpitation, chest discomfort or lower extremity swelling Gastrointestinal:  Denies nausea, constipation, diarrhea GU: Denies dysuria or incontinence Skin: Denies abnormal skin rashes Neurological: Per HPI Musculoskeletal: Denies joint pain, back or neck discomfort. No decrease in ROM Behavioral/Psych: Denies anxiety, disturbance in thought content, and mood instability  Physical Exam: Vitals:   11/17/18 1433  BP: 129/73  Pulse: 65  Resp: 17  Temp: 98.5 F (36.9 C)  SpO2: 99%   KPS: 90. General: Alert, cooperative, pleasant, in no acute distress Head: Craniotomy scar noted, dry and intact. EENT: No conjunctival injection or scleral icterus. Oral mucosa moist Lungs: Resp effort normal Cardiac: Regular rate and rhythm Abdomen: Soft, non-distended abdomen Skin: No rashes cyanosis or petechiae. Extremities: No clubbing or edema  Neurologic Exam: Mental Status: Awake, alert, attentive to examiner. Oriented to self and environment. Language is fluent with intact comprehension.  Cranial Nerves: Visual acuity is grossly normal. Right inferior quadrantanopia. Extra-ocular movements intact. No ptosis. Face is symmetric, tongue midline. Motor: Tone and bulk are  normal. Power is full in both arms and legs. Reflexes are symmetric, no pathologic reflexes present. Intact finger to nose bilaterally Sensory: Intact to light touch and temperature Gait: Normal and tandem gait is impaired.   Labs: I have reviewed the data as listed    Component Value Date/Time   NA 138 10/29/2018 0608   K 4.4 10/29/2018 0608   CL 106 10/29/2018 0608   CO2 26 10/29/2018 0608   GLUCOSE 167 (H) 10/29/2018 0608   BUN 14 10/29/2018 0608   CREATININE 1.18 10/29/2018 0608   CALCIUM 8.8 (L) 10/29/2018 0608   PROT 6.6 10/28/2018 2004   ALBUMIN 3.9 10/28/2018 2004   AST 20 10/28/2018 2004   ALT 18 10/28/2018 2004   ALKPHOS 99 10/28/2018 2004   BILITOT 0.6 10/28/2018 2004   GFRNONAA >60 10/29/2018 0608   GFRAA >60 10/29/2018 0608   Lab Results  Component Value Date   WBC 9.7 11/17/2018   NEUTROABS 8.8 (H) 11/17/2018   HGB 14.1 11/17/2018   HCT 40.4 11/17/2018   MCV 84.2 11/17/2018   PLT 152 11/17/2018  Imaging:  Ct Head Wo Contrast  Result Date: 10/28/2018 CLINICAL DATA:  Confusion, history of brain malignancy EXAM: CT HEAD WITHOUT CONTRAST TECHNIQUE: Contiguous axial images were obtained from the base of the skull through the vertex without intravenous contrast. COMPARISON:  MR September 30, 2018, CT head September 27, 2018 FINDINGS: Brain: There is a region of gliosis involving the left occipital and parietal lobes with postsurgical changes of prior craniotomy and resection of the previously seen necrotic mass. Some residual heterogeneity remains at the resection site, incompletely assessed on this unenhanced CT. Mild overlying hyperattenuating dural thickening is present as well which can be an expected finding post resection. Mild residual effacement of the left hemispheric sulci. No evidence of acute infarction, hemorrhage, hydrocephalus, or new extra-axial collection. Vascular: No hyperdense vessel or unexpected calcification. Skull: Post craniotomy findings, as above  with mild overlying soft tissue infiltration. No large collection. No acute osseous abnormality or suspicious osseous lesion. Sinuses/Orbits: Paranasal sinuses and mastoid air cells are predominantly clear. Included orbital structures are unremarkable. Other: None IMPRESSION: 1. Postsurgical changes of prior craniotomy and resection of the previously seen necrotic mass in the left occipital and parietal lobes. Some residual heterogeneity remains at the resection site, incompletely assessed on this unenhanced CT. Consider contrast-enhanced MRI for further evaluation and comparison to postsurgical baseline imaging. 2. No other acute intracranial abnormality. Electronically Signed   By: Lovena Le M.D.   On: 10/28/2018 19:59   Mr Jeri Cos And Wo Contrast  Result Date: 10/28/2018 CLINICAL DATA:  Altered level of consciousness. Previous brain tumor resection. EXAM: MRI HEAD WITHOUT AND WITH CONTRAST TECHNIQUE: Multiplanar, multiecho pulse sequences of the brain and surrounding structures were obtained without and with intravenous contrast. CONTRAST:  23m GADAVIST GADOBUTROL 1 MMOL/ML IV SOLN COMPARISON:  Head CT 10/28/2018.  MRI 09/30/2018. FINDINGS: Brain: Worsened pattern with increased mass effect and edema in the left occipital and parietal brain. Areas increasing enhancement in the occipital and parietal region probably represent worsening of residual tumor. Cannot rule out some component of pseudo progression. No evidence of significant hemorrhage, hydrocephalus or extra-axial fluid collection. Vascular: Major vessels at the base of the brain show flow. Skull and upper cervical spine: Otherwise negative Sinuses/Orbits: Clear/normal Other: None IMPRESSION: Marked worsening of the pattern in the left occipital and posterior parietal region, most consistent with progression of the underlying malignancy with increase in the amount of enhancing mass and increase in regional edema. There could be some element of  pseudoprogression, but I think the majority represents true progression. Electronically Signed   By: MNelson ChimesM.D.   On: 10/28/2018 22:37   Dg Chest Port 1 View  Result Date: 10/28/2018 CLINICAL DATA:  Confusion and altered mental status. EXAM: PORTABLE CHEST 1 VIEW COMPARISON:  Chest CT 09/27/2018 FINDINGS: Low lung volumes.The cardiomediastinal contours are normal. Minor basilar atelectasis. Pulmonary vasculature is normal. No consolidation, pleural effusion, or pneumothorax. No acute osseous abnormalities are seen. IMPRESSION: Low lung volumes with mild bibasilar atelectasis. Electronically Signed   By: MKeith RakeM.D.   On: 10/28/2018 19:33     Assessment/Plan Glioblastoma ([facility]  Mr. RPerkinsis clinically stable today, now having completed 2 weeks of radiation.  We recommended continuing treatment with Temozolomide 739mm2 daily for the duration of radiation.  Chemotherapy should be held for the following:  ANC less than 1,000  Platelets less than 100,000  LFT or creatinine greater than 2x ULN  If clinical concerns/contraindications develop    We recommended he continue  Keppra 512m BID and reduce decadron to 139mdaily x1 week, then STOP.  We appreciate the opportunity to participate in the care of Cody [name]   He will return for follow up during weeks 4 and 6 of IMRT with labs for evaluation.  All questions were answered. The patient knows to call the clinic with any problems, questions or concerns. No barriers to learning were detected.  The total time spent in the encounter was 25 minutes and more than 50% was on counseling and review of test results   ZaVentura SellersMD Medical Director of Neuro-Oncology [facility] We[facility]0/15/20 2:31 PM

## 2018-11-18 ENCOUNTER — Other Ambulatory Visit: Payer: Self-pay

## 2018-11-18 ENCOUNTER — Telehealth: Payer: Self-pay | Admitting: Internal Medicine

## 2018-11-18 ENCOUNTER — Ambulatory Visit
Admission: RE | Admit: 2018-11-18 | Discharge: 2018-11-18 | Disposition: A | Discharge status: Home / Self Care | Payer: BC Managed Care – PPO | Source: Ambulatory Visit | Attending: Radiation Oncology | Admitting: Radiation Oncology

## 2018-11-18 DIAGNOSIS — Z510 Encounter for antineoplastic radiation therapy: Secondary | ICD-10-CM | POA: Diagnosis not present

## 2018-11-18 NOTE — Telephone Encounter (Signed)
No los per 10/15. °

## 2018-11-21 ENCOUNTER — Ambulatory Visit
Admission: RE | Admit: 2018-11-21 | Discharge: 2018-11-21 | Disposition: A | Discharge status: Home / Self Care | Payer: BC Managed Care – PPO | Source: Ambulatory Visit | Attending: Radiation Oncology | Admitting: Radiation Oncology

## 2018-11-21 DIAGNOSIS — Z510 Encounter for antineoplastic radiation therapy: Secondary | ICD-10-CM | POA: Diagnosis not present

## 2018-11-22 ENCOUNTER — Ambulatory Visit
Admission: RE | Admit: 2018-11-22 | Discharge: 2018-11-22 | Disposition: A | Discharge status: Home / Self Care | Payer: BC Managed Care – PPO | Source: Ambulatory Visit | Attending: Radiation Oncology | Admitting: Radiation Oncology

## 2018-11-22 ENCOUNTER — Other Ambulatory Visit: Payer: Self-pay

## 2018-11-22 DIAGNOSIS — Z510 Encounter for antineoplastic radiation therapy: Secondary | ICD-10-CM | POA: Diagnosis not present

## 2018-11-23 ENCOUNTER — Other Ambulatory Visit: Payer: Self-pay | Admitting: Internal Medicine

## 2018-11-23 ENCOUNTER — Ambulatory Visit
Admission: RE | Admit: 2018-11-23 | Discharge: 2018-11-23 | Disposition: A | Discharge status: Home / Self Care | Payer: BC Managed Care – PPO | Source: Ambulatory Visit | Attending: Radiation Oncology | Admitting: Radiation Oncology

## 2018-11-23 ENCOUNTER — Other Ambulatory Visit: Payer: Self-pay

## 2018-11-23 DIAGNOSIS — Z510 Encounter for antineoplastic radiation therapy: Secondary | ICD-10-CM | POA: Diagnosis not present

## 2018-11-23 DIAGNOSIS — C719 Malignant neoplasm of brain, unspecified: Secondary | ICD-10-CM

## 2018-11-24 ENCOUNTER — Ambulatory Visit
Admission: RE | Admit: 2018-11-24 | Discharge: 2018-11-24 | Disposition: A | Discharge status: Home / Self Care | Payer: BC Managed Care – PPO | Source: Ambulatory Visit | Attending: Radiation Oncology | Admitting: Radiation Oncology

## 2018-11-24 DIAGNOSIS — Z510 Encounter for antineoplastic radiation therapy: Secondary | ICD-10-CM | POA: Diagnosis not present

## 2018-11-25 ENCOUNTER — Other Ambulatory Visit: Payer: Self-pay

## 2018-11-25 ENCOUNTER — Ambulatory Visit
Admission: RE | Admit: 2018-11-25 | Discharge: 2018-11-25 | Disposition: A | Discharge status: Home / Self Care | Payer: BC Managed Care – PPO | Source: Ambulatory Visit | Attending: Radiation Oncology | Admitting: Radiation Oncology

## 2018-11-25 DIAGNOSIS — Z510 Encounter for antineoplastic radiation therapy: Secondary | ICD-10-CM | POA: Diagnosis not present

## 2018-11-25 MED FILL — TEMOZOLOMIDE 140 MG CAPS: 140 | 14 days supply | Qty: 14 | Fill #1

## 2018-11-28 ENCOUNTER — Ambulatory Visit
Admission: RE | Admit: 2018-11-28 | Discharge: 2018-11-28 | Disposition: A | Discharge status: Home / Self Care | Payer: BC Managed Care – PPO | Source: Ambulatory Visit | Attending: Radiation Oncology | Admitting: Radiation Oncology

## 2018-11-28 ENCOUNTER — Telehealth: Payer: Self-pay | Admitting: *Deleted

## 2018-11-28 ENCOUNTER — Other Ambulatory Visit: Payer: Self-pay | Admitting: Internal Medicine

## 2018-11-28 ENCOUNTER — Other Ambulatory Visit: Payer: Self-pay

## 2018-11-28 DIAGNOSIS — Z510 Encounter for antineoplastic radiation therapy: Secondary | ICD-10-CM | POA: Diagnosis not present

## 2018-11-28 MED ORDER — DEXAMETHASONE 1 MG PO TABS: 1.0000 mg | ORAL_TABLET | Freq: Every day | ORAL | 0 refills | Status: DC

## 2018-11-28 MED ORDER — TRAMADOL HCL 50 MG PO TABS: 50.0000 mg | ORAL_TABLET | Freq: Four times a day (QID) | ORAL | 0 refills | Status: DC | PRN

## 2018-11-28 NOTE — Telephone Encounter (Signed)
Patient called to report headaches since coming off of Decadron.  He reports trying Decadron 0.5mg  and it did relieve the headache.  Patient states Tylenol was not effective.  Per Dr. Mickeal Skinner ok to resume Decadron 0.5mg  daily for headache management.

## 2018-11-29 ENCOUNTER — Other Ambulatory Visit: Payer: Self-pay

## 2018-11-29 ENCOUNTER — Telehealth: Payer: Self-pay | Admitting: *Deleted

## 2018-11-29 ENCOUNTER — Ambulatory Visit
Admission: RE | Admit: 2018-11-29 | Discharge: 2018-11-29 | Disposition: A | Discharge status: Home / Self Care | Payer: BC Managed Care – PPO | Source: Ambulatory Visit | Attending: Radiation Oncology | Admitting: Radiation Oncology

## 2018-11-29 DIAGNOSIS — Z510 Encounter for antineoplastic radiation therapy: Secondary | ICD-10-CM | POA: Diagnosis not present

## 2018-11-29 NOTE — Telephone Encounter (Signed)
11/28/2018 patient called back to advise that he took one of the Decadron's that he had on hand before picking up the new Rx and ended up taking Decadron 2 mg and the headache persisted even after a nap.  Dr. Mickeal Skinner sent Tramadol Rx.  Instructed patient that if headache does not improve by tomorrow then he needs to be evaluated on 11/29/2018

## 2018-11-30 ENCOUNTER — Ambulatory Visit
Admission: RE | Admit: 2018-11-30 | Discharge: 2018-11-30 | Disposition: A | Discharge status: Home / Self Care | Payer: BC Managed Care – PPO | Source: Ambulatory Visit | Attending: Radiation Oncology | Admitting: Radiation Oncology

## 2018-11-30 DIAGNOSIS — Z510 Encounter for antineoplastic radiation therapy: Secondary | ICD-10-CM | POA: Diagnosis not present

## 2018-12-01 ENCOUNTER — Inpatient Hospital Stay (HOSPITAL_BASED_OUTPATIENT_CLINIC_OR_DEPARTMENT_OTHER): Payer: BC Managed Care – PPO | Admitting: Internal Medicine

## 2018-12-01 ENCOUNTER — Other Ambulatory Visit: Payer: Self-pay

## 2018-12-01 ENCOUNTER — Ambulatory Visit
Admission: RE | Admit: 2018-12-01 | Discharge: 2018-12-01 | Disposition: A | Discharge status: Home / Self Care | Payer: BC Managed Care – PPO | Source: Ambulatory Visit | Attending: Radiation Oncology | Admitting: Radiation Oncology

## 2018-12-01 ENCOUNTER — Inpatient Hospital Stay: Payer: BC Managed Care – PPO

## 2018-12-01 VITALS — BP 131/73 | HR 52 | Temp 98.9°F | Resp 18 | Ht 72.0 in | Wt 187.0 lb

## 2018-12-01 DIAGNOSIS — C719 Malignant neoplasm of brain, unspecified: Secondary | ICD-10-CM

## 2018-12-01 DIAGNOSIS — Z510 Encounter for antineoplastic radiation therapy: Secondary | ICD-10-CM | POA: Diagnosis not present

## 2018-12-01 LAB — CMP (CANCER CENTER ONLY)
ALT: 18 U/L (ref 0–44)
AST: 12 U/L — ABNORMAL LOW (ref 15–41)
Albumin: 3.8 g/dL (ref 3.5–5.0)
Alkaline Phosphatase: 83 U/L (ref 38–126)
Anion gap: 9 (ref 5–15)
BUN: 15 mg/dL (ref 6–20)
CO2: 27 mmol/L (ref 22–32)
Calcium: 8.8 mg/dL — ABNORMAL LOW (ref 8.9–10.3)
Chloride: 103 mmol/L (ref 98–111)
Creatinine: 1.4 mg/dL — ABNORMAL HIGH (ref 0.61–1.24)
GFR, Est AFR Am: >60 mL/min (ref >60)
GFR, Estimated: 56 mL/min — ABNORMAL LOW (ref >60)
Glucose, Bld: 105 mg/dL — ABNORMAL HIGH (ref 70–99)
Potassium: 4 mmol/L (ref 3.5–5.1)
Sodium: 139 mmol/L (ref 135–145)
Total Bilirubin: 0.6 mg/dL (ref 0.3–1.2)
Total Protein: 6.6 g/dL (ref 6.5–8.1)

## 2018-12-01 LAB — CBC WITH DIFFERENTIAL (CANCER CENTER ONLY)
Abs Immature Granulocytes: 0.02 10*3/uL (ref 0.00–0.07)
Basophils Absolute: 0 10*3/uL (ref 0.0–0.1)
Basophils Relative: 0 %
Eosinophils Absolute: 0 10*3/uL (ref 0.0–0.5)
Eosinophils Relative: 1 %
HCT: 41.6 % (ref 39.0–52.0)
Hemoglobin: 14.3 g/dL (ref 13.0–17.0)
Immature Granulocytes: 0 %
Lymphocytes Relative: 15 %
Lymphs Abs: 1.2 10*3/uL (ref 0.7–4.0)
MCH: 29.3 pg (ref 26.0–34.0)
MCHC: 34.4 g/dL (ref 30.0–36.0)
MCV: 85.2 fL (ref 80.0–100.0)
Monocytes Absolute: 0.5 10*3/uL (ref 0.1–1.0)
Monocytes Relative: 7 %
Neutro Abs: 6.2 10*3/uL (ref 1.7–7.7)
Neutrophils Relative %: 77 %
Platelet Count: 166 10*3/uL (ref 150–400)
RBC: 4.88 MIL/uL (ref 4.22–5.81)
RDW: 14 % (ref 11.5–15.5)
WBC Count: 8.1 10*3/uL (ref 4.0–10.5)
nRBC: 0 % (ref 0.0–0.2)

## 2018-12-01 MED ORDER — LEVETIRACETAM 500 MG PO TABS: 1000.0000 mg | ORAL_TABLET | Freq: Two times a day (BID) | ORAL | 0 refills | Status: DC

## 2018-12-01 NOTE — Progress Notes (Signed)
[facility] at [place], [place] [address] [phone]   Interval Evaluation  Date of Service: 12/01/18 Patient Name: Cody [name] Patient MRN: [id number] Patient DOB: May 31, 1960 Provider: Ventura Sellers, MD  Identifying Statement:  Cody [name] is a 58 y.o. male with left occipital glioblastoma   Oncologic History: Oncology History  Glioblastoma ([facility])  09/29/2018 Surgery   Craniotomy, resection with Dr. Kathyrn Sheriff   10/21/2018 -  Chemotherapy   The patient had [No matching medication found in this treatment plan]  for chemotherapy treatment.      Biomarkers:  MGMT Unknown.  IDH 1/2 Wild type.  EGFR Unknown  TERT Unknown   Interval History:  Cody [name] presents today for follow up after 4 weeks of radiation therapy and temozolomide.  He and his wife describe decline recently.  Last night at 9pm he suddenly lost ability to communicate.  This has improved somewhat today but he is still not back to baseline.  In addition he has been having severe morning headaches.  Gait is still independent but he feels unsteady compared to prior. Otherwise no issues tolerating TMZ.    H+P (10/18/18) Patient presented to medical attention in August 2020 with several weeks history of new onset headaches.  Progressive nature of symptoms led to an ED admission, where CNS imaging demonstrated enhancing mass in the left occipital lobe.  He underwent craniotomy and resection with Dr. Kathyrn Sheriff on 7/93/90 without complication.  He was discharged to home with small visual field impairment and otherwise functionally intact.  He denies headaches or any seizures since surgery, having completed decadron taper.    Medications: Current Outpatient Medications on File Prior to Visit  Medication Sig Dispense Refill  . dexamethasone (DECADRON) 1 MG tablet Take 1 tablet (1 mg total) by mouth daily. Take 1/2 tablet by mouth daily. 30 tablet 0   . finasteride (PROSCAR) 5 MG tablet Take 5 mg by mouth daily.    [name] levETIRAcetam (KEPPRA) 500 MG tablet Take 1 tablet (500 mg total) by mouth 2 (two) times daily. 60 tablet 0  . ondansetron (ZOFRAN) 8 MG tablet Take 1 tablet (8 mg total) by mouth 2 (two) times daily as needed (nausea and vomiting). May take 30-60 minutes prior to Temodar administration if nausea/vomiting occurs. (Patient not taking: Reported on 11/03/2018) 30 tablet 1  . temozolomide (TEMODAR) 140 MG capsule Take 1 capsule (140 mg total) by mouth daily. May take on an empty stomach to decrease nausea & vomiting. 42 capsule 0  . traMADol (ULTRAM) 50 MG tablet Take 1 tablet (50 mg total) by mouth every 6 (six) hours as needed for severe pain. 10 tablet 0   No current facility-administered medications on file prior to visit.     Allergies: No Known Allergies Past Medical History:  Past Medical History:  Diagnosis Date  . Enlarged prostate    Past Surgical History:  Past Surgical History:  Procedure Laterality Date  . APPLICATION OF CRANIAL NAVIGATION Left 09/29/2018   Procedure: APPLICATION OF CRANIAL NAVIGATION;  Surgeon: [name], MD;  Location: [facility];  Service: Neurosurgery;  Laterality: Left;  . CRANIOTOMY Left 09/29/2018   Procedure: Sterotactic Left craniotomy for resection of tumor;  Surgeon: [name], MD;  Location: [facility];  Service: Neurosurgery;  Laterality: Left;  Sterotactic Left craniotomy for resection of tumor   Social History:  Social History   Socioeconomic History  . Marital status: Married  Spouse name: Not on file  . Number of children: Not on file  . Years of education: Not on file  . Highest education level: Not on file  Occupational History  . Not on file  Social Needs  . Financial resource strain: Not on file  . Food insecurity    Worry: Not on file    Inability: Not on file  . Transportation needs    Medical: Yes    Non-medical: No  Tobacco Use  . Smoking status:  Never Smoker  . Smokeless tobacco: Never Used  Substance and Sexual Activity  . Alcohol use: Never    Frequency: Never  . Drug use: Never  . Sexual activity: Not on file  Lifestyle  . Physical activity    Days per week: Not on file    Minutes per session: Not on file  . Stress: Not on file  Relationships  . Social Herbalist on phone: Not on file    Gets together: Not on file    Attends religious service: Not on file    Active member of club or organization: Not on file    Attends meetings of clubs or organizations: Not on file    Relationship status: Not on file  . Intimate partner violence    Fear of current or ex partner: No    Emotionally abused: No    Physically abused: No    Forced sexual activity: No  Other Topics Concern  . Not on file  Social History Narrative  . Not on file   Family History: No family history on file.  Review of Systems: Constitutional: Denies fevers, chills or abnormal weight loss Eyes: Denies blurriness of vision Ears, nose, mouth, throat, and face: Denies mucositis or sore throat Respiratory: Denies cough, dyspnea or wheezes Cardiovascular: Denies palpitation, chest discomfort or lower extremity swelling Gastrointestinal:  Denies nausea, constipation, diarrhea GU: Denies dysuria or incontinence Skin: Denies abnormal skin rashes Neurological: Per HPI Musculoskeletal: Denies joint pain, back or neck discomfort. No decrease in ROM Behavioral/Psych: Denies anxiety, disturbance in thought content, and mood instability  Physical Exam: Vitals:   12/01/18 1525  BP: 131/73  Pulse: (!) 52  Resp: 18  Temp: 98.9 F (37.2 C)  SpO2: 100%   KPS: 90. General: Alert, cooperative, pleasant, in no acute distress Head: Craniotomy scar noted, dry and intact. EENT: No conjunctival injection or scleral icterus. Oral mucosa moist Lungs: Resp effort normal Cardiac: Regular rate and rhythm Abdomen: Soft, non-distended abdomen Skin: No rashes  cyanosis or petechiae. Extremities: No clubbing or edema  Neurologic Exam: Mental Status: Awake, alert, attentive to examiner. Oriented to self and environment. Expressive dysphasia noted. Cranial Nerves: Visual acuity is grossly normal. Right inferior quadrantanopia. Extra-ocular movements intact. No ptosis. Face is symmetric, tongue midline. Motor: Tone and bulk are normal. Power is full in both arms and legs. Reflexes are symmetric, no pathologic reflexes present. Intact finger to nose bilaterally Sensory: Intact to light touch and temperature Gait: Deferred  Labs: I have reviewed the data as listed    Component Value Date/Time   NA 144 11/17/2018 1421   K 4.3 11/17/2018 1421   CL 105 11/17/2018 1421   CO2 28 11/17/2018 1421   GLUCOSE 114 (H) 11/17/2018 1421   BUN 20 11/17/2018 1421   CREATININE 1.44 (H) 11/17/2018 1421   CALCIUM 8.9 11/17/2018 1421   PROT 6.7 11/17/2018 1421   ALBUMIN 3.8 11/17/2018 1421   AST 21 11/17/2018 1421  ALT 36 11/17/2018 1421   ALKPHOS 99 11/17/2018 1421   BILITOT 0.5 11/17/2018 1421   GFRNONAA 54 (L) 11/17/2018 1421   GFRAA >60 11/17/2018 1421   Lab Results  Component Value Date   WBC 8.1 12/01/2018   NEUTROABS 6.2 12/01/2018   HGB 14.3 12/01/2018   HCT 41.6 12/01/2018   MCV 85.2 12/01/2018   PLT 166 12/01/2018     Assessment/Plan Glioblastoma ([facility])  Cody [name] presents today with clinical changes.  Event leading to dyshpasia is likely a breakthrough seizure, it is similar in character to prior seizure event.  The seizure concurrent with new morning headaches are concerning for increased intracranial pressure, likely secondary to localized inflammatory response to radiochemotherapy.    Recommendations: -Start dexamethasone 16m BID -Increase Keppra to 10070mBID -Return to clinic in 4 days to evaluate response to therapy -Con't with RT today, tomorrow with break over weekend -Hold Temozolomide in the interim  Chemotherapy should  be held for the following:  ANC less than 1,000  Platelets less than 100,000  LFT or creatinine greater than 2x ULN  If clinical concerns/contraindications develop  We appreciate the opportunity to participate in the care of MiCELESTINE [name]   He will return for follow up next week; if not improved we will obtain repeat brain MRI and may need to consider early initiation of Avastin.  All questions were answered. The patient knows to call the clinic with any problems, questions or concerns. No barriers to learning were detected.  The total time spent in the encounter was 40 minutes and more than 50% was on counseling and review of test results   ZaVentura SellersMD Medical Director of Neuro-Oncology [facility]0/29/20 3:17 PM

## 2018-12-02 ENCOUNTER — Ambulatory Visit
Admission: RE | Admit: 2018-12-02 | Discharge: 2018-12-02 | Disposition: A | Discharge status: Home / Self Care | Payer: BC Managed Care – PPO | Source: Ambulatory Visit | Attending: Radiation Oncology | Admitting: Radiation Oncology

## 2018-12-02 DIAGNOSIS — Z510 Encounter for antineoplastic radiation therapy: Secondary | ICD-10-CM | POA: Diagnosis not present

## 2018-12-05 ENCOUNTER — Ambulatory Visit
Admission: RE | Admit: 2018-12-05 | Discharge: 2018-12-05 | Disposition: A | Discharge status: Home / Self Care | Payer: BC Managed Care – PPO | Source: Ambulatory Visit | Attending: Radiation Oncology | Admitting: Radiation Oncology

## 2018-12-05 DIAGNOSIS — Z510 Encounter for antineoplastic radiation therapy: Secondary | ICD-10-CM | POA: Insufficient documentation

## 2018-12-05 DIAGNOSIS — C714 Malignant neoplasm of occipital lobe: Secondary | ICD-10-CM | POA: Insufficient documentation

## 2018-12-06 ENCOUNTER — Ambulatory Visit
Admission: RE | Admit: 2018-12-06 | Discharge: 2018-12-06 | Disposition: A | Discharge status: Home / Self Care | Payer: BC Managed Care – PPO | Source: Ambulatory Visit | Attending: Radiation Oncology | Admitting: Radiation Oncology

## 2018-12-06 ENCOUNTER — Inpatient Hospital Stay: Payer: BC Managed Care – PPO | Attending: Internal Medicine | Admitting: Internal Medicine

## 2018-12-06 ENCOUNTER — Other Ambulatory Visit: Payer: Self-pay

## 2018-12-06 VITALS — BP 147/78 | HR 55 | Temp 99.1°F | Resp 17 | Ht 72.0 in | Wt 188.0 lb

## 2018-12-06 DIAGNOSIS — N400 Enlarged prostate without lower urinary tract symptoms: Secondary | ICD-10-CM | POA: Insufficient documentation

## 2018-12-06 DIAGNOSIS — C719 Malignant neoplasm of brain, unspecified: Secondary | ICD-10-CM

## 2018-12-06 DIAGNOSIS — Z79899 Other long term (current) drug therapy: Secondary | ICD-10-CM | POA: Insufficient documentation

## 2018-12-06 DIAGNOSIS — C714 Malignant neoplasm of occipital lobe: Secondary | ICD-10-CM | POA: Diagnosis not present

## 2018-12-06 NOTE — Progress Notes (Signed)
[facility] at [place], [place] [address] [phone]   Interval Evaluation  Date of Service: 12/06/18 Patient Name: Cody [name] Patient MRN: [id number] Patient DOB: 06-13-1960 Provider: Ventura Sellers, MD  Identifying Statement:  Cody [name] is a 58 y.o. male with left occipital glioblastoma   Oncologic History: Oncology History  Glioblastoma ([facility])  09/29/2018 Surgery   Craniotomy, resection with Dr. Kathyrn Sheriff   10/21/2018 -  Chemotherapy   The patient had [No matching medication found in this treatment plan]  for chemotherapy treatment.      Biomarkers:  MGMT Unknown.  IDH 1/2 Wild type.  EGFR Unknown  TERT Unknown   Interval History:  Cody [name] presents today for follow up after recent breakthrough seizure.  He describes significant improvement following initiation of dexamethasone 56m twice per day.  He has had no recurrent events and feels at his baseline. Only sporadic mild headaches.  H+P (10/18/18) Patient presented to medical attention in August 2020 with several weeks history of new onset headaches.  Progressive nature of symptoms led to an ED admission, where CNS imaging demonstrated enhancing mass in the left occipital lobe.  He underwent craniotomy and resection with Dr. [name] 84/69/62without complication.  He was discharged to home with small visual field impairment and otherwise functionally intact.  He denies headaches or any seizures since surgery, having completed decadron taper.    Medications: Current Outpatient Medications on File Prior to Visit  Medication Sig Dispense Refill  . dexamethasone (DECADRON) 1 MG tablet Take 1 tablet (1 mg total) by mouth daily. Take 1/2 tablet by mouth daily. 30 tablet 0  . finasteride (PROSCAR) 5 MG tablet Take 5 mg by mouth daily.    .Marland KitchenlevETIRAcetam (KEPPRA) 500 MG tablet Take 2 tablets (1,000 mg total) by mouth 2 (two) times daily. 60  tablet 0  . ondansetron (ZOFRAN) 8 MG tablet Take 1 tablet (8 mg total) by mouth 2 (two) times daily as needed (nausea and vomiting). May take 30-60 minutes prior to Temodar administration if nausea/vomiting occurs. 30 tablet 1  . temozolomide (TEMODAR) 140 MG capsule Take 1 capsule (140 mg total) by mouth daily. May take on an empty stomach to decrease nausea & vomiting. 42 capsule 0  . traMADol (ULTRAM) 50 MG tablet Take 1 tablet (50 mg total) by mouth every 6 (six) hours as needed for severe pain. (Patient not taking: Reported on 12/06/2018) 10 tablet 0   No current facility-administered medications on file prior to visit.     Allergies: No Known Allergies Past Medical History:  Past Medical History:  Diagnosis Date  . Enlarged prostate    Past Surgical History:  Past Surgical History:  Procedure Laterality Date  . APPLICATION OF CRANIAL NAVIGATION Left 09/29/2018   Procedure: APPLICATION OF CRANIAL NAVIGATION;  Surgeon: NConsuella Lose MD;  [facility]  Service: Neurosurgery;  Laterality: Left;  . CRANIOTOMY Left 09/29/2018   Procedure: Sterotactic Left craniotomy for resection of tumor;  Surgeon: NConsuella Lose MD;  [facility]  Service: Neurosurgery;  Laterality: Left;  Sterotactic Left craniotomy for resection of tumor   Social History:  Social History   Socioeconomic History  . Marital status: Married    Spouse name: Not on file  . Number of children: Not on file  . Years of education: Not on file  . Highest education level: Not on file  Occupational History  . Not on file  Social Needs  . Financial resource strain: Not on file  . Food insecurity    Worry: Not on file    Inability: Not on file  . Transportation needs    Medical: Yes    Non-medical: No  Tobacco Use  . Smoking status: Never Smoker  . Smokeless tobacco: Never Used  Substance and Sexual Activity  . Alcohol use: Never    Frequency: Never  . Drug use: Never  . Sexual activity: Not on  file  Lifestyle  . Physical activity    Days per week: Not on file    Minutes per session: Not on file  . Stress: Not on file  Relationships  . Social Herbalist on phone: Not on file    Gets together: Not on file    Attends religious service: Not on file    Active member of club or organization: Not on file    Attends meetings of clubs or organizations: Not on file    Relationship status: Not on file  . Intimate partner violence    Fear of current or ex partner: No    Emotionally abused: No    Physically abused: No    Forced sexual activity: No  Other Topics Concern  . Not on file  Social History Narrative  . Not on file   Family History: No family history on file.  Review of Systems: Constitutional: Denies fevers, chills or abnormal weight loss Eyes: Denies blurriness of vision Ears, nose, mouth, throat, and face: Denies mucositis or sore throat Respiratory: Denies cough, dyspnea or wheezes Cardiovascular: Denies palpitation, chest discomfort or lower extremity swelling Gastrointestinal:  Denies nausea, constipation, diarrhea GU: Denies dysuria or incontinence Skin: Denies abnormal skin rashes Neurological: Per HPI Musculoskeletal: Denies joint pain, back or neck discomfort. No decrease in ROM Behavioral/Psych: Denies anxiety, disturbance in thought content, and mood instability  Physical Exam: Vitals:   12/06/18 1522  BP: (!) 147/78  Pulse: (!) 55  Resp: 17  Temp: 99.1 F (37.3 C)  SpO2: 100%   KPS: 90. General: Alert, cooperative, pleasant, in no acute distress Head: Craniotomy scar noted, dry and intact. EENT: No conjunctival injection or scleral icterus. Oral mucosa moist Lungs: Resp effort normal Cardiac: Regular rate and rhythm Abdomen: Soft, non-distended abdomen Skin: No rashes cyanosis or petechiae. Extremities: No clubbing or edema  Neurologic Exam: Mental Status: Awake, alert, attentive to examiner. Oriented to self and environment.  Language fluent with intact comprehension. Cranial Nerves: Visual acuity is grossly normal. Right inferior quadrantanopia. Extra-ocular movements intact. No ptosis. Face is symmetric, tongue midline. Motor: Tone and bulk are normal. Power is full in both arms and legs. Reflexes are symmetric, no pathologic reflexes present. Intact finger to nose bilaterally Sensory: Intact to light touch and temperature Gait: Deferred  Labs: I have reviewed the data as listed    Component Value Date/Time   NA 139 12/01/2018 1446   K 4.0 12/01/2018 1446   CL 103 12/01/2018 1446   CO2 27 12/01/2018 1446   GLUCOSE 105 (H) 12/01/2018 1446   BUN 15 12/01/2018 1446   CREATININE 1.40 (H) 12/01/2018 1446   CALCIUM 8.8 (L) 12/01/2018 1446   PROT 6.6 12/01/2018 1446   ALBUMIN 3.8 12/01/2018 1446   AST 12 (L) 12/01/2018 1446   ALT 18 12/01/2018 1446   ALKPHOS 83 12/01/2018 1446   BILITOT 0.6 12/01/2018 1446   GFRNONAA 56 (L) 12/01/2018 1446   GFRAA >60 12/01/2018 1446   Lab  Results  Component Value Date   WBC 8.1 12/01/2018   NEUTROABS 6.2 12/01/2018   HGB 14.3 12/01/2018   HCT 41.6 12/01/2018   MCV 85.2 12/01/2018   PLT 166 12/01/2018     Assessment/Plan Glioblastoma ([facility])  Mr. Privitera is clinically improved today after interventions last week.    Recommendations: -Decrease dexamethasone to 72m daily -Con't Keppra to 10080mBID -Resume Temozolomide 7573m2 daily  Chemotherapy should be held for the following:  ANC less than 1,000  Platelets less than 100,000  LFT or creatinine greater than 2x ULN  If clinical concerns/contraindications develop  We appreciate the opportunity to participate in the care of Cody [name]  He will return for follow up next week with labs as scheduled.  All questions were answered. The patient knows to call the clinic with any problems, questions or concerns. No barriers to learning were detected.  The total time spent in the encounter was 25  minutes and more than 50% was on counseling and review of test results   ZacVentura SellersD Medical Director of Neuro-Oncology [facility]/03/20 3:25 PM

## 2018-12-07 ENCOUNTER — Telehealth: Payer: Self-pay | Admitting: Internal Medicine

## 2018-12-07 ENCOUNTER — Ambulatory Visit
Admission: RE | Admit: 2018-12-07 | Discharge: 2018-12-07 | Disposition: A | Discharge status: Home / Self Care | Payer: BC Managed Care – PPO | Source: Ambulatory Visit | Attending: Radiation Oncology | Admitting: Radiation Oncology

## 2018-12-07 DIAGNOSIS — C714 Malignant neoplasm of occipital lobe: Secondary | ICD-10-CM | POA: Diagnosis not present

## 2018-12-07 NOTE — Telephone Encounter (Signed)
Per 11/3 no los °

## 2018-12-08 ENCOUNTER — Ambulatory Visit
Admission: RE | Admit: 2018-12-08 | Discharge: 2018-12-08 | Disposition: A | Discharge status: Home / Self Care | Payer: BC Managed Care – PPO | Source: Ambulatory Visit | Attending: Radiation Oncology | Admitting: Radiation Oncology

## 2018-12-08 DIAGNOSIS — C714 Malignant neoplasm of occipital lobe: Secondary | ICD-10-CM | POA: Diagnosis not present

## 2018-12-09 ENCOUNTER — Other Ambulatory Visit: Payer: Self-pay | Admitting: Internal Medicine

## 2018-12-09 ENCOUNTER — Other Ambulatory Visit: Payer: Self-pay

## 2018-12-09 ENCOUNTER — Ambulatory Visit
Admission: RE | Admit: 2018-12-09 | Discharge: 2018-12-09 | Disposition: A | Discharge status: Home / Self Care | Payer: BC Managed Care – PPO | Source: Ambulatory Visit | Attending: Radiation Oncology | Admitting: Radiation Oncology

## 2018-12-09 DIAGNOSIS — C714 Malignant neoplasm of occipital lobe: Secondary | ICD-10-CM | POA: Diagnosis not present

## 2018-12-09 NOTE — Telephone Encounter (Signed)
Pt is requesting refill

## 2018-12-12 ENCOUNTER — Ambulatory Visit
Admission: RE | Admit: 2018-12-12 | Discharge: 2018-12-12 | Disposition: A | Discharge status: Home / Self Care | Payer: BC Managed Care – PPO | Source: Ambulatory Visit | Attending: Radiation Oncology | Admitting: Radiation Oncology

## 2018-12-12 DIAGNOSIS — C714 Malignant neoplasm of occipital lobe: Secondary | ICD-10-CM | POA: Diagnosis not present

## 2018-12-13 ENCOUNTER — Ambulatory Visit
Admission: RE | Admit: 2018-12-13 | Discharge: 2018-12-13 | Disposition: A | Discharge status: Home / Self Care | Payer: BC Managed Care – PPO | Source: Ambulatory Visit | Attending: Radiation Oncology | Admitting: Radiation Oncology

## 2018-12-13 ENCOUNTER — Other Ambulatory Visit: Payer: Self-pay

## 2018-12-13 ENCOUNTER — Inpatient Hospital Stay (HOSPITAL_BASED_OUTPATIENT_CLINIC_OR_DEPARTMENT_OTHER): Payer: BC Managed Care – PPO | Admitting: Internal Medicine

## 2018-12-13 ENCOUNTER — Encounter: Payer: Self-pay | Admitting: Radiation Oncology

## 2018-12-13 ENCOUNTER — Inpatient Hospital Stay: Payer: BC Managed Care – PPO

## 2018-12-13 VITALS — BP 129/74 | HR 60 | Temp 98.0°F | Resp 18 | Ht 72.0 in | Wt 187.1 lb

## 2018-12-13 DIAGNOSIS — C719 Malignant neoplasm of brain, unspecified: Secondary | ICD-10-CM

## 2018-12-13 DIAGNOSIS — C714 Malignant neoplasm of occipital lobe: Secondary | ICD-10-CM | POA: Diagnosis not present

## 2018-12-13 LAB — CBC WITH DIFFERENTIAL (CANCER CENTER ONLY)
Abs Immature Granulocytes: 0.16 10*3/uL — ABNORMAL HIGH (ref 0.00–0.07)
Basophils Absolute: 0 10*3/uL (ref 0.0–0.1)
Basophils Relative: 0 %
Eosinophils Absolute: 0 10*3/uL (ref 0.0–0.5)
Eosinophils Relative: 0 %
HCT: 43.5 % (ref 39.0–52.0)
Hemoglobin: 15.1 g/dL (ref 13.0–17.0)
Immature Granulocytes: 1 %
Lymphocytes Relative: 4 %
Lymphs Abs: 0.5 10*3/uL — ABNORMAL LOW (ref 0.7–4.0)
MCH: 29.4 pg (ref 26.0–34.0)
MCHC: 34.7 g/dL (ref 30.0–36.0)
MCV: 84.6 fL (ref 80.0–100.0)
Monocytes Absolute: 0.3 10*3/uL (ref 0.1–1.0)
Monocytes Relative: 2 %
Neutro Abs: 13.7 10*3/uL — ABNORMAL HIGH (ref 1.7–7.7)
Neutrophils Relative %: 93 %
Platelet Count: 208 10*3/uL (ref 150–400)
RBC: 5.14 MIL/uL (ref 4.22–5.81)
RDW: 14.3 % (ref 11.5–15.5)
WBC Count: 14.7 10*3/uL — ABNORMAL HIGH (ref 4.0–10.5)
nRBC: 0 % (ref 0.0–0.2)

## 2018-12-13 LAB — CMP (CANCER CENTER ONLY)
ALT: 23 U/L (ref 0–44)
AST: 12 U/L — ABNORMAL LOW (ref 15–41)
Albumin: 3.8 g/dL (ref 3.5–5.0)
Alkaline Phosphatase: 100 U/L (ref 38–126)
Anion gap: 11 (ref 5–15)
BUN: 21 mg/dL — ABNORMAL HIGH (ref 6–20)
CO2: 25 mmol/L (ref 22–32)
Calcium: 8.7 mg/dL — ABNORMAL LOW (ref 8.9–10.3)
Chloride: 105 mmol/L (ref 98–111)
Creatinine: 1.54 mg/dL — ABNORMAL HIGH (ref 0.61–1.24)
GFR, Est AFR Am: 58 mL/min — ABNORMAL LOW (ref >60)
GFR, Estimated: 50 mL/min — ABNORMAL LOW (ref >60)
Glucose, Bld: 160 mg/dL — ABNORMAL HIGH (ref 70–99)
Potassium: 4.3 mmol/L (ref 3.5–5.1)
Sodium: 141 mmol/L (ref 135–145)
Total Bilirubin: 0.3 mg/dL (ref 0.3–1.2)
Total Protein: 6.5 g/dL (ref 6.5–8.1)

## 2018-12-13 MED ORDER — DEXAMETHASONE 1 MG PO TABS: 2.0000 mg | ORAL_TABLET | Freq: Every day | ORAL | 0 refills | Status: DC

## 2018-12-13 NOTE — Progress Notes (Signed)
[facility] [place], [place] [id number] [phone]   Interval Evaluation  Date of Service: 12/13/18 Patient Name: Cody [name] Patient MRN: [id number] Patient DOB: 1960-12-25 Provider: Ventura Sellers, MD  Identifying Statement:  Cody [name] is a 58 y.o. male with left occipital glioblastoma   Oncologic History: Oncology History  Glioblastoma ([place])  09/29/2018 Surgery   Craniotomy, resection with Dr. Kathyrn Sheriff   10/21/2018 -  Chemotherapy   The patient had [No matching medication found in this treatment plan]  for chemotherapy treatment.      Biomarkers:  MGMT Unknown.  IDH 1/2 Wild type.  EGFR Unknown  TERT Unknown   Interval History:  Cody [name] presents today for follow up now in final week of radiation.  He describes continued improvement even while decreasing dexamethasone to 83m daily.  He has had no recurrent events and feels at his baseline. Only sporadic mild headaches.  H+P (10/18/18) Patient presented to medical attention in August 2020 with several weeks history of new onset headaches.  Progressive nature of symptoms led to an ED admission, where CNS imaging demonstrated enhancing mass in the left occipital lobe.  He underwent craniotomy and resection with Dr. [name] 87/40/81without complication.  He was discharged to home with small visual field impairment and otherwise functionally intact.  He denies headaches or any seizures since surgery, having completed decadron taper.    Medications: Current Outpatient Medications on File Prior to Visit  Medication Sig Dispense Refill  . dexamethasone (DECADRON) 1 MG tablet Take 1 tablet (1 mg total) by mouth daily. Take 1/2 tablet by mouth daily. 30 tablet 0  . finasteride (PROSCAR) 5 MG tablet Take 5 mg by mouth daily.    .Marland KitchenlevETIRAcetam (KEPPRA) 500 MG tablet Take 2 tablets (1,000 mg total) by mouth 2 (two) times daily. 60 tablet 0  .  ondansetron (ZOFRAN) 8 MG tablet Take 1 tablet (8 mg total) by mouth 2 (two) times daily as needed (nausea and vomiting). May take 30-60 minutes prior to Temodar administration if nausea/vomiting occurs. 30 tablet 1  . temozolomide (TEMODAR) 140 MG capsule Take 1 capsule (140 mg total) by mouth daily. May take on an empty stomach to decrease nausea & vomiting. 42 capsule 0  . traMADol (ULTRAM) 50 MG tablet TAKE 1 TABLET(50 MG) BY MOUTH EVERY 6 HOURS AS NEEDED FOR SEVERE PAIN 10 tablet 0   No current facility-administered medications on file prior to visit.     Allergies: No Known Allergies Past Medical History:  Past Medical History:  Diagnosis Date  . Enlarged prostate    Past Surgical History:  Past Surgical History:  Procedure Laterality Date  . APPLICATION OF CRANIAL NAVIGATION Left 09/29/2018   Procedure: APPLICATION OF CRANIAL NAVIGATION;  Surgeon: NConsuella Lose MD;  [facility]  Service: Neurosurgery;  Laterality: Left;  . CRANIOTOMY Left 09/29/2018   Procedure: Sterotactic Left craniotomy for resection of tumor;  Surgeon: NConsuella Lose MD;  [facility]  Service: Neurosurgery;  Laterality: Left;  Sterotactic Left craniotomy for resection of tumor   Social History:  Social History   Socioeconomic History  . Marital status: Married    Spouse name: Not on file  . Number of children: Not on file  . Years of education: Not on file  . Highest education level: Not on file  Occupational History  . Not on file  Social Needs  . Financial resource strain:  Not on file  . Food insecurity    Worry: Not on file    Inability: Not on file  . Transportation needs    Medical: Yes    Non-medical: No  Tobacco Use  . Smoking status: Never Smoker  . Smokeless tobacco: Never Used  Substance and Sexual Activity  . Alcohol use: Never    Frequency: Never  . Drug use: Never  . Sexual activity: Not on file  Lifestyle  . Physical activity    Days per week: Not on file     Minutes per session: Not on file  . Stress: Not on file  Relationships  . Social Herbalist on phone: Not on file    Gets together: Not on file    Attends religious service: Not on file    Active member of club or organization: Not on file    Attends meetings of clubs or organizations: Not on file    Relationship status: Not on file  . Intimate partner violence    Fear of current or ex partner: No    Emotionally abused: No    Physically abused: No    Forced sexual activity: No  Other Topics Concern  . Not on file  Social History Narrative  . Not on file   Family History: No family history on file.  Review of Systems: Constitutional: Denies fevers, chills or abnormal weight loss Eyes: Denies blurriness of vision Ears, nose, mouth, throat, and face: Denies mucositis or sore throat Respiratory: Denies cough, dyspnea or wheezes Cardiovascular: Denies palpitation, chest discomfort or lower extremity swelling Gastrointestinal:  Denies nausea, constipation, diarrhea GU: Denies dysuria or incontinence Skin: Denies abnormal skin rashes Neurological: Per HPI Musculoskeletal: Denies joint pain, back or neck discomfort. No decrease in ROM Behavioral/Psych: Denies anxiety, disturbance in thought content, and mood instability  Physical Exam: Vitals:   12/13/18 1527  BP: 129/74  Pulse: 60  Resp: 18  Temp: 98 F (36.7 C)  SpO2: 100%   KPS: 90. General: Alert, cooperative, pleasant, in no acute distress Head: Craniotomy scar noted, dry and intact. EENT: No conjunctival injection or scleral icterus. Oral mucosa moist Lungs: Resp effort normal Cardiac: Regular rate and rhythm Abdomen: Soft, non-distended abdomen Skin: No rashes cyanosis or petechiae. Extremities: No clubbing or edema  Neurologic Exam: Mental Status: Awake, alert, attentive to examiner. Oriented to self and environment. Language fluent with intact comprehension. Cranial Nerves: Visual acuity is  grossly normal. Right inferior quadrantanopia. Extra-ocular movements intact. No ptosis. Face is symmetric, tongue midline. Motor: Tone and bulk are normal. Power is full in both arms and legs. Reflexes are symmetric, no pathologic reflexes present. Intact finger to nose bilaterally Sensory: Intact to light touch and temperature Gait: Deferred  Labs: I have reviewed the data as listed    Component Value Date/Time   NA 139 12/01/2018 1446   K 4.0 12/01/2018 1446   CL 103 12/01/2018 1446   CO2 27 12/01/2018 1446   GLUCOSE 105 (H) 12/01/2018 1446   BUN 15 12/01/2018 1446   CREATININE 1.40 (H) 12/01/2018 1446   CALCIUM 8.8 (L) 12/01/2018 1446   PROT 6.6 12/01/2018 1446   ALBUMIN 3.8 12/01/2018 1446   AST 12 (L) 12/01/2018 1446   ALT 18 12/01/2018 1446   ALKPHOS 83 12/01/2018 1446   BILITOT 0.6 12/01/2018 1446   GFRNONAA 56 (L) 12/01/2018 1446   GFRAA >60 12/01/2018 1446   Lab Results  Component Value Date   WBC 14.7 (  H) 12/13/2018   NEUTROABS 13.7 (H) 12/13/2018   HGB 15.1 12/13/2018   HCT 43.5 12/13/2018   MCV 84.6 12/13/2018   PLT 208 12/13/2018     Assessment/Plan Glioblastoma ([place]) - Plan: MR Brain W Wo Contrast  Mr. Snowball is clinically stable today, now in final week of radiation therapy.    Recommendations: -Decrease dexamethasone to 58m x1 week, then 269mx1 week, then 67m48m1 week, then STOP -Con't Keppra 1000m22mD  Chemotherapy should be held for the following:  ANC less than 1,000  Platelets less than 100,000  LFT or creatinine greater than 2x ULN  If clinical concerns/contraindications develop  We appreciate the opportunity to participate in the care of MichLAMARIUS DIRR He will return to clinic in 3-4 weeks following post-radiation brain MRI for evaluation.  All questions were answered. The patient knows to call the clinic with any problems, questions or concerns. No barriers to learning were detected.  The total time spent in the encounter  was 25 minutes and more than 50% was on counseling and review of test results   ZachVentura Sellers Medical Director of Neuro-Oncology [facility] HospitalWeslBenedict10/20 3:26 PM

## 2018-12-15 ENCOUNTER — Telehealth: Payer: Self-pay | Admitting: Internal Medicine

## 2018-12-15 NOTE — Telephone Encounter (Signed)
I talk with patient regarding schedule  

## 2018-12-16 ENCOUNTER — Other Ambulatory Visit: Payer: Self-pay

## 2018-12-16 ENCOUNTER — Emergency Department (HOSPITAL_COMMUNITY)
Admission: EM | Admit: 2018-12-16 | Discharge: 2018-12-16 | Disposition: A | Discharge status: Home / Self Care | Payer: BC Managed Care – PPO | Attending: Emergency Medicine | Admitting: Emergency Medicine

## 2018-12-16 ENCOUNTER — Encounter (HOSPITAL_COMMUNITY): Payer: Self-pay | Admitting: Emergency Medicine

## 2018-12-16 ENCOUNTER — Emergency Department (HOSPITAL_COMMUNITY): Payer: BC Managed Care – PPO

## 2018-12-16 ENCOUNTER — Telehealth: Payer: Self-pay | Admitting: *Deleted

## 2018-12-16 DIAGNOSIS — R519 Headache, unspecified: Secondary | ICD-10-CM

## 2018-12-16 DIAGNOSIS — D332 Benign neoplasm of brain, unspecified: Secondary | ICD-10-CM

## 2018-12-16 DIAGNOSIS — C714 Malignant neoplasm of occipital lobe: Secondary | ICD-10-CM | POA: Diagnosis not present

## 2018-12-16 DIAGNOSIS — Z79899 Other long term (current) drug therapy: Secondary | ICD-10-CM | POA: Diagnosis not present

## 2018-12-16 HISTORY — DX: Migraine, unspecified, not intractable, without status migrainosus: G43.909

## 2018-12-16 LAB — CBC
HCT: 45.9 % (ref 39.0–52.0)
Hemoglobin: 15.6 g/dL (ref 13.0–17.0)
MCH: 29.3 pg (ref 26.0–34.0)
MCHC: 34 g/dL (ref 30.0–36.0)
MCV: 86.3 fL (ref 80.0–100.0)
Platelets: 169 10*3/uL (ref 150–400)
RBC: 5.32 MIL/uL (ref 4.22–5.81)
RDW: 14.5 % (ref 11.5–15.5)
WBC: 15 10*3/uL — ABNORMAL HIGH (ref 4.0–10.5)
nRBC: 0 % (ref 0.0–0.2)

## 2018-12-16 LAB — BASIC METABOLIC PANEL
Anion gap: 7 (ref 5–15)
BUN: 16 mg/dL (ref 6–20)
CO2: 30 mmol/L (ref 22–32)
Calcium: 8.8 mg/dL — ABNORMAL LOW (ref 8.9–10.3)
Chloride: 101 mmol/L (ref 98–111)
Creatinine, Ser: 1.29 mg/dL — ABNORMAL HIGH (ref 0.61–1.24)
GFR calc Af Amer: >60 mL/min (ref >60)
GFR calc non Af Amer: >60 mL/min (ref >60)
Glucose, Bld: 112 mg/dL — ABNORMAL HIGH (ref 70–99)
Potassium: 4.4 mmol/L (ref 3.5–5.1)
Sodium: 138 mmol/L (ref 135–145)

## 2018-12-16 IMAGING — CT CT HEAD W/O CM
3 series · 15 of 47 positions shown, 18 images · non-contrast
Comparison: [DATE]

CLINICAL DATA: Glioblastoma, headache

EXAM:
CT HEAD WITHOUT CONTRAST
TECHNIQUE: Contiguous axial images were obtained from the base of the skull
through the vertex without intravenous contrast.

[Series 2: head wo · axial · 0.47mm/px · z∈[+1671,+1796]mm · 9 of 30 slices shown, 12 images]
[im 3/30  brain]
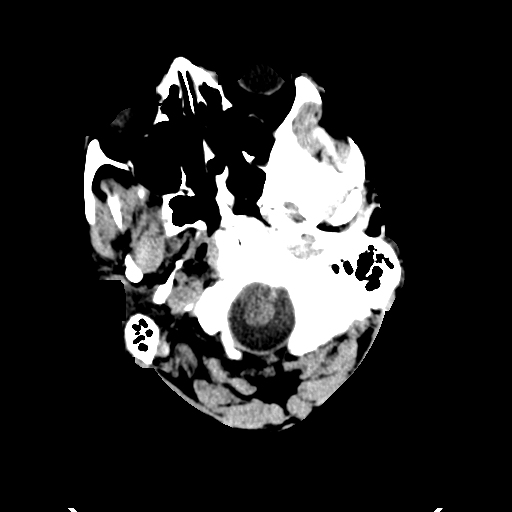
[im 3/30  bone]
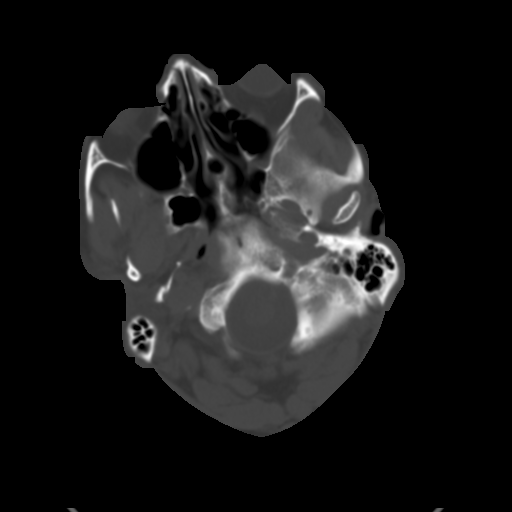
[im 6/30  brain]
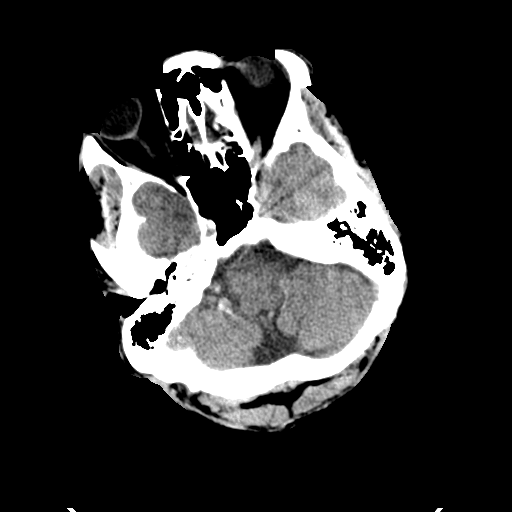
[im 9/30  brain]
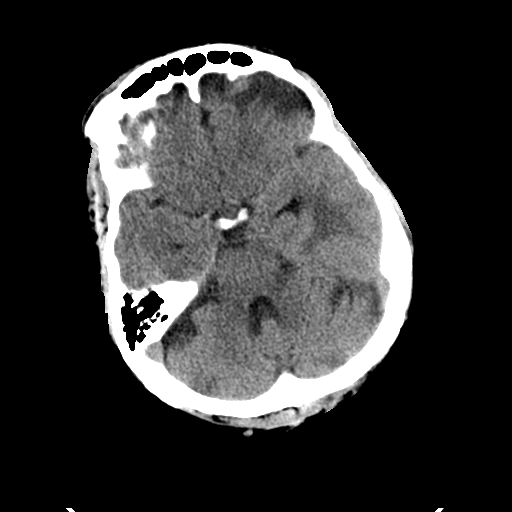
[im 12/30  brain]
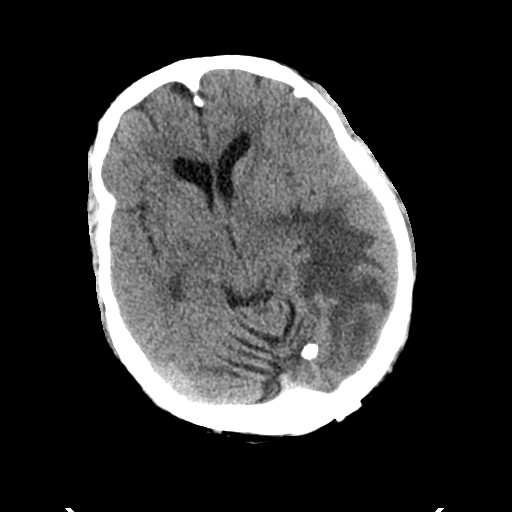
[im 16/30  brain]
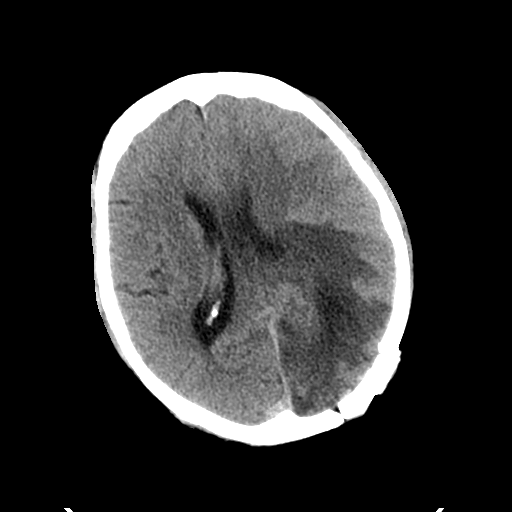
[im 16/30  bone]
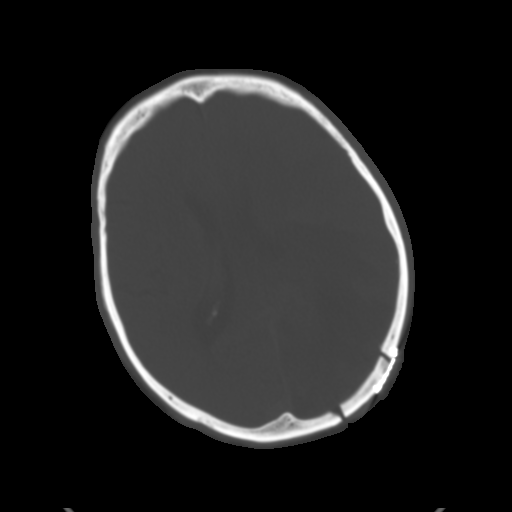
[im 19/30  brain]
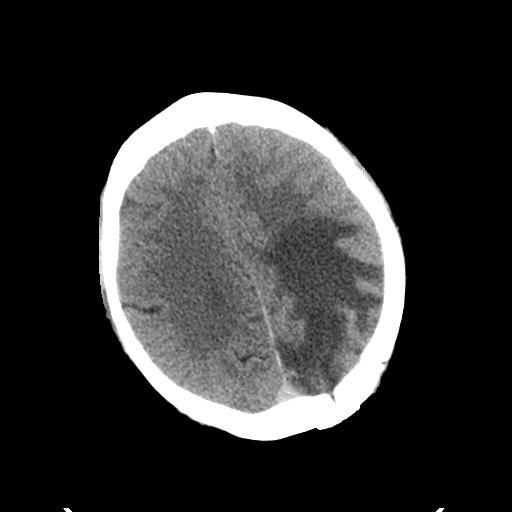
[im 22/30  brain]
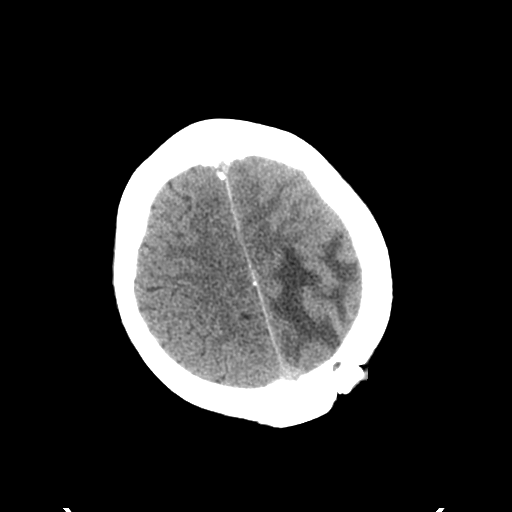
[im 25/30  brain]
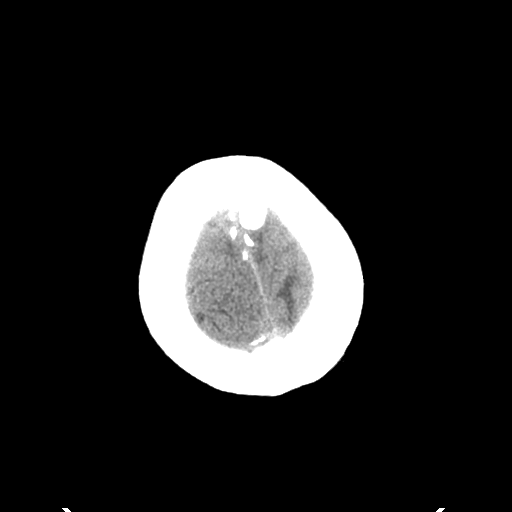
[im 28/30  brain]
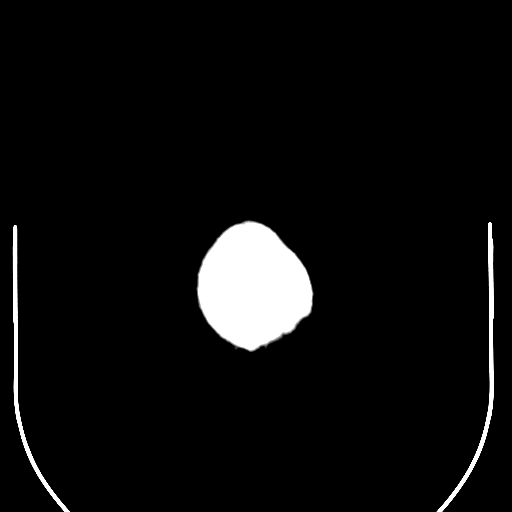
[im 28/30  bone]
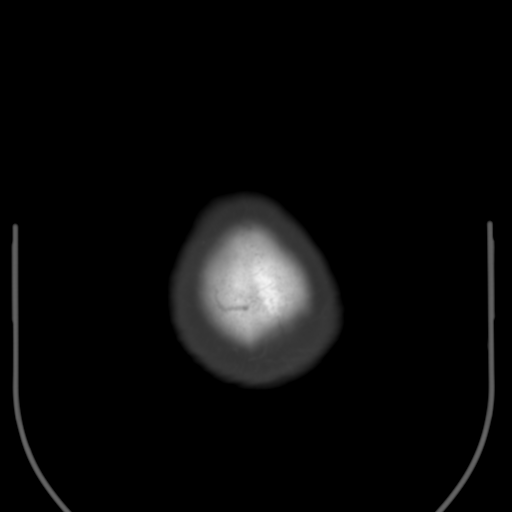

[Series 4: coronal soft tissue · coronal · 0.34mm/px · 3 of 75 slices shown]
[im 25/75  brain]
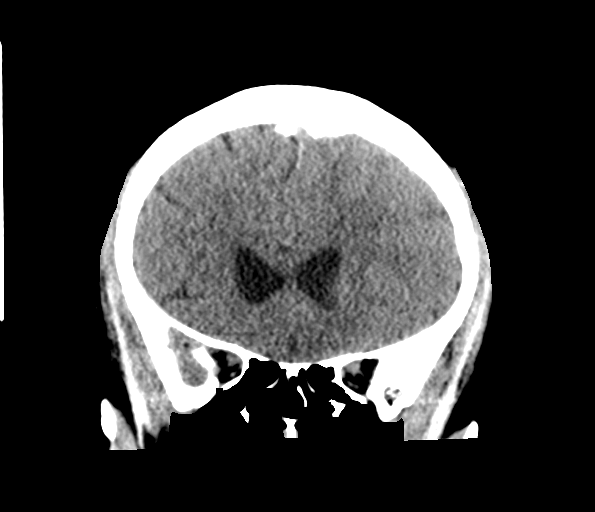
[im 33/75  brain]
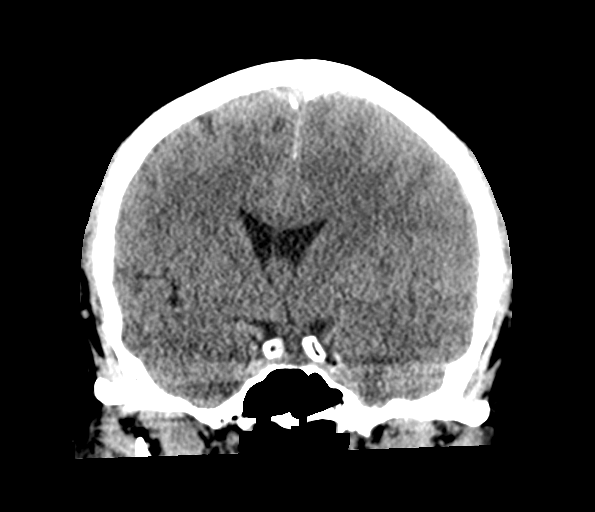
[im 42/75  brain]
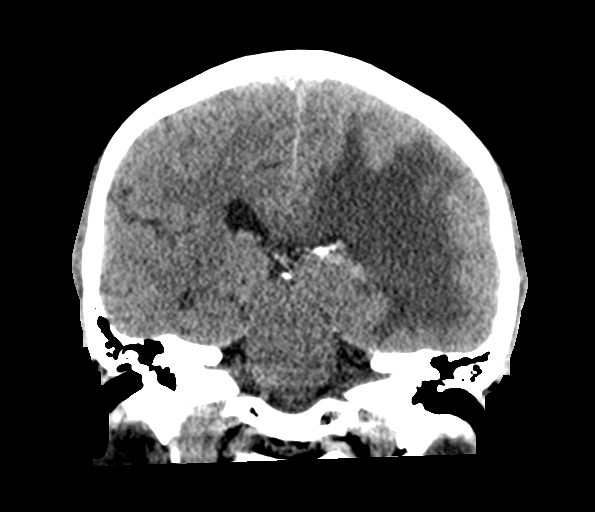

[Series 5: sagittal soft tissue · sagittal · 0.34mm/px · 3 of 67 slices shown]
[im 23/67  brain]
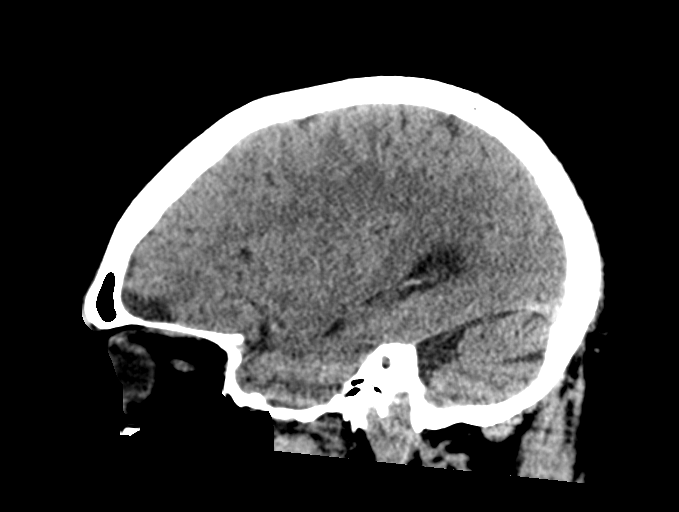
[im 34/67  brain]
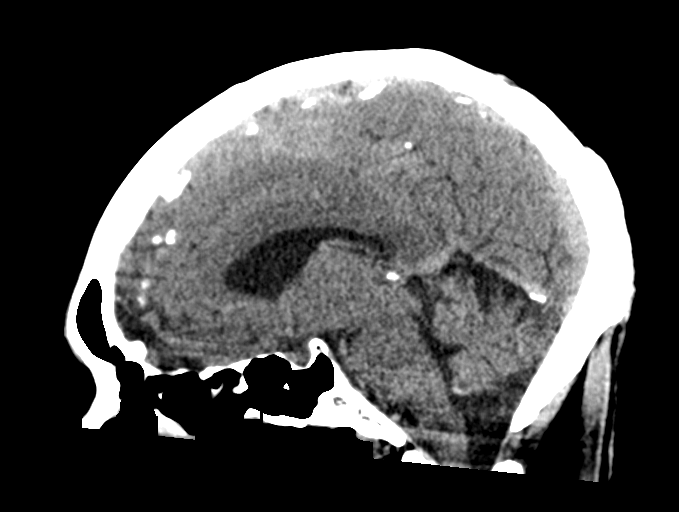
[im 45/67  brain]
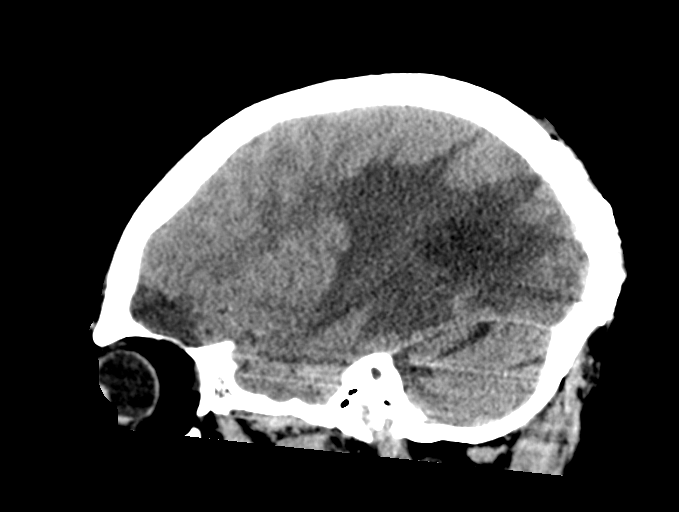

[15 of 47 positions shown; findings below may reference images not displayed]

FINDINGS: Brain: There are postoperative changes of left parietal craniotomy.
No acute intracranial hemorrhage. Extensive confluent
hypoattenuation is present in the posterior left cerebral hemisphere
as seen previously. However, there is increased extent, for example
now with greater involvement of the temporal lobe. Regional mass
effect is increased and there is now approximately 6 mm of rightward
midline shift at the level the septum pellucidum.

Bilateral inferior frontal encephalomalacia. No new loss of
gray-white differentiation. There is no hydrocephalus.

Vascular: No hyperdense vessel.

Skull: Unremarkable apart from craniotomy.

Sinuses/Orbits: Aerated.  Unremarkable.

Other: None.
IMPRESSION: Increase in extensive confluent hypoattenuation in the left cerebral
hemisphere, which may reflect edema and/or infiltrating tumor.
Increased regional mass effect including new mild rightward midline
shift.

No acute intracranial hemorrhage.

## 2018-12-16 MED ORDER — DEXAMETHASONE 4 MG PO TABS: 4.0000 mg | ORAL_TABLET | Freq: Every day | ORAL | 0 refills | Status: DC

## 2018-12-16 MED ORDER — DEXAMETHASONE SODIUM PHOSPHATE 10 MG/ML IJ SOLN: 10.0000 mg | Freq: Once | INTRAMUSCULAR | Status: DC

## 2018-12-16 MED ORDER — FENTANYL CITRATE (PF) 100 MCG/2ML IJ SOLN
100.0000 ug | Freq: Once | INTRAMUSCULAR | Status: AC
  Administered 2018-12-16: 100 ug via INTRAVENOUS
  Filled 2018-12-16: qty 2

## 2018-12-16 MED ORDER — HYDROCODONE-ACETAMINOPHEN 5-325 MG PO TABS: 1.0000 | ORAL_TABLET | Freq: Four times a day (QID) | ORAL | 0 refills | Status: DC | PRN

## 2018-12-16 NOTE — ED Triage Notes (Signed)
Per pt, states he has a history of migraines-woke up this morning with one-has not been around anyone who has tested positive for covid

## 2018-12-16 NOTE — Telephone Encounter (Signed)
Cody [name] states he woke up at 0915 " I have a major headache and don't feel well, I was thinking about going to the ER. I took my medicine this morning and it is not helping"  Dr Mickeal Skinner agrees that he should go to the ED.   Pt states he has someone who can drive him

## 2018-12-16 NOTE — ED Provider Notes (Signed)
[facility] DEPT Provider Note   CSN: [id number] Arrival date & time: 12/16/18  1058     History   Chief Complaint Chief Complaint  Patient presents with  . Headache    HPI Cody [name] is a 58 y.o. male.     HPI Patient presents after worsening headache.  States has had headache over the last few days.  Has known glioblastoma.  Has been on steroids had been tapering down by Dr. Mickeal Skinner.  States his headache is more severe.  Mostly on the back of his head.  States he also is feeling more confused overall.  States he feels a little slowed.  No trauma.  He is not on anticoagulation. Past Medical History:  Diagnosis Date  . Enlarged prostate   . Migraines     Patient Active Problem List   Diagnosis Date Noted  . BPH (benign prostatic hyperplasia) 10/29/2018  . Acute metabolic encephalopathy [id number]  . Altered mental status   . Primary cancer of occipital lobe ([place]) 10/21/2018  . Glioblastoma ([place]) 09/27/2018    Past Surgical History:  Procedure Laterality Date  . APPLICATION OF CRANIAL NAVIGATION Left 09/29/2018   Procedure: APPLICATION OF CRANIAL NAVIGATION;  Surgeon: [name], MD;  Location: [facility];  Service: Neurosurgery;  Laterality: Left;  . CRANIOTOMY Left 09/29/2018   Procedure: Sterotactic Left craniotomy for resection of tumor;  Surgeon: [name], MD;  Location: [facility];  Service: Neurosurgery;  Laterality: Left;  Sterotactic Left craniotomy for resection of tumor        Home Medications    Prior to Admission medications   Medication Sig Start Date End Date Taking? Authorizing Provider  dexamethasone (DECADRON) 4 MG tablet Take 1 tablet (4 mg total) by mouth daily. 12/16/18   Davonna Belling, MD  finasteride (PROSCAR) 5 MG tablet Take 5 mg by mouth daily.    [provider]  HYDROcodone-acetaminophen (NORCO/VICODIN) 5-325 MG tablet Take 1-2 tablets by mouth every 6 (six) hours as needed.  12/16/18   Davonna Belling, MD  levETIRAcetam (KEPPRA) 500 MG tablet Take 2 tablets (1,000 mg total) by mouth 2 (two) times daily. 12/01/18   Vaslow, Acey Lav, MD  ondansetron (ZOFRAN) 8 MG tablet Take 1 tablet (8 mg total) by mouth 2 (two) times daily as needed (nausea and vomiting). May take 30-60 minutes prior to Temodar administration if nausea/vomiting occurs. 10/21/18   Ventura Sellers, MD  temozolomide (TEMODAR) 140 MG capsule Take 1 capsule (140 mg total) by mouth daily. May take on an empty stomach to decrease nausea & vomiting. 10/21/18   Vaslow, Acey Lav, MD  traMADol (ULTRAM) 50 MG tablet TAKE 1 TABLET(50 MG) BY MOUTH EVERY 6 HOURS AS NEEDED FOR SEVERE PAIN 12/09/18   Ventura Sellers, MD    Family History No family history on file.  Social History Social History   Tobacco Use  . Smoking status: Never Smoker  . Smokeless tobacco: Never Used  Substance Use Topics  . Alcohol use: Never    Frequency: Never  . Drug use: Never     Allergies   Patient has no known allergies.   Review of Systems Review of Systems  Constitutional: Negative for unexpected weight change.  Respiratory: Negative for shortness of breath.   Cardiovascular: Negative for chest pain.  Gastrointestinal: Negative for abdominal pain and nausea.  Genitourinary: Negative for flank pain.  Musculoskeletal: Negative for back pain.  Neurological: Positive for headaches.  Psychiatric/Behavioral: Positive for confusion.  Physical Exam Updated Vital Signs BP 137/82   Pulse (!) 42   Temp 98.2 F (36.8 C) (Oral)   Resp 14   Ht 6' (1.829 m)   Wt 85.3 kg   SpO2 98%   BMI 25.50 kg/m   Physical Exam Vitals signs and nursing note reviewed.  Pulmonary:     Breath sounds: Normal breath sounds.  Skin:    General: Skin is warm.  Neurological:     Mental Status: He is alert.     Comments: Mildly slowed.  Otherwise able answer questions  Psychiatric:        Mood and Affect: Mood normal.       ED Treatments / Results  Labs (all labs ordered are listed, but only abnormal results are displayed) Labs Reviewed  BASIC METABOLIC PANEL - Abnormal; Notable for the following components:      Result Value   Glucose, Bld 112 (*)    Creatinine, Ser 1.29 (*)    Calcium 8.8 (*)    All other components within normal limits  CBC - Abnormal; Notable for the following components:   WBC 15.0 (*)    All other components within normal limits    EKG None  Radiology Ct Head Wo Contrast  Result Date: 12/16/2018 CLINICAL DATA:  Glioblastoma, headache EXAM: CT HEAD WITHOUT CONTRAST TECHNIQUE: Contiguous axial images were obtained from the base of the skull through the vertex without intravenous contrast. COMPARISON:  October 28 2018 FINDINGS: Brain: There are postoperative changes of left parietal craniotomy. No acute intracranial hemorrhage. Extensive confluent hypoattenuation is present in the posterior left cerebral hemisphere as seen previously. However, there is increased extent, for example now with greater involvement of the temporal lobe. Regional mass effect is increased and there is now approximately 6 mm of rightward midline shift at the level the septum pellucidum. Bilateral inferior frontal encephalomalacia. No new loss of gray-white differentiation. There is no hydrocephalus. Vascular: No hyperdense vessel. Skull: Unremarkable apart from craniotomy. Sinuses/Orbits: Aerated.  Unremarkable. Other: None. IMPRESSION: Increase in extensive confluent hypoattenuation in the left cerebral hemisphere, which may reflect edema and/or infiltrating tumor. Increased regional mass effect including new mild rightward midline shift. No acute intracranial hemorrhage. Electronically Signed   By: Macy Mis M.D.   On: 12/16/2018 15:39    Procedures Procedures (including critical care time)  Medications Ordered in ED Medications  dexamethasone (DECADRON) injection 10 mg (has no administration in  time range)  fentaNYL (SUBLIMAZE) injection 100 mcg (100 mcg Intravenous Given 12/16/18 1447)     Initial Impression / Assessment and Plan / ED Course  I have reviewed the triage vital signs and the nursing notes.  Pertinent labs & imaging results that were available during my care of the patient were reviewed by me and considered in my medical decision making (see chart for details).        Patient with headache and known glioma.  Head CT done and shows worsening swelling.  Dr. Mickeal Skinner reviewed the images.  Recommended increasing steroids.  Will give pain medicine also as needed.  Discussed with patient.  Follow-up as an outpatient.  Final Clinical Impressions(s) / ED Diagnoses   Final diagnoses:  Acute nonintractable headache, unspecified headache type  Benign neoplasm of brain, unspecified brain region [facility])    ED Discharge Orders         Ordered    dexamethasone (DECADRON) 4 MG tablet  Daily     12/16/18 1622    HYDROcodone-acetaminophen (NORCO/VICODIN) 5-325  MG tablet  Every 6 hours PRN     12/16/18 1622           Davonna Belling, MD 12/16/18 1625

## 2018-12-16 NOTE — Discharge Instructions (Addendum)
Take the steroid at 4 mg a day.  Follow-up with Dr. Shelly Bombard

## 2018-12-19 ENCOUNTER — Other Ambulatory Visit: Payer: Self-pay | Admitting: Radiation Therapy

## 2018-12-20 ENCOUNTER — Ambulatory Visit: Payer: Self-pay | Admitting: Neurology

## 2018-12-22 ENCOUNTER — Other Ambulatory Visit: Payer: Self-pay

## 2018-12-22 ENCOUNTER — Inpatient Hospital Stay (HOSPITAL_BASED_OUTPATIENT_CLINIC_OR_DEPARTMENT_OTHER): Payer: BC Managed Care – PPO | Admitting: Internal Medicine

## 2018-12-22 ENCOUNTER — Other Ambulatory Visit: Payer: Self-pay | Admitting: Internal Medicine

## 2018-12-22 DIAGNOSIS — C719 Malignant neoplasm of brain, unspecified: Secondary | ICD-10-CM

## 2018-12-22 MED ORDER — LEVETIRACETAM 500 MG PO TABS: 1000.0000 mg | ORAL_TABLET | Freq: Two times a day (BID) | ORAL | 3 refills | Status: DC

## 2018-12-23 ENCOUNTER — Other Ambulatory Visit: Payer: Self-pay

## 2018-12-23 ENCOUNTER — Telehealth: Payer: Self-pay | Admitting: Internal Medicine

## 2018-12-23 ENCOUNTER — Other Ambulatory Visit: Payer: Self-pay | Admitting: Internal Medicine

## 2018-12-23 MED ORDER — DEXAMETHASONE 4 MG PO TABS: 4.0000 mg | ORAL_TABLET | Freq: Two times a day (BID) | ORAL | 1 refills | Status: DC

## 2018-12-23 NOTE — Progress Notes (Signed)
I connected with Cody [name] on 12/23/18 at  9:30 AM EST by telephone visit and verified that I am speaking with the correct person using two identifiers.  I discussed the limitations, risks, security and privacy concerns of performing an evaluation and management service by telemedicine and the availability of in-person appointments. I also discussed with the patient that there may be a patient responsible charge related to this service. The patient expressed understanding and agreed to proceed.  Other persons participating in the visit and their role in the encounter:  spouse  Patient's location:  Home  Provider's location:  Office  Chief Complaint:  Change in mentation  History of Present Ilness: Cody [name] spouse called to describe several days of decline in speech/language.  He is increasingly having difficult time communicating and understanding others.  No acute changes and no witnessed seizures as prior.  AM headaches have persisted. He continues to take decadron 4mg  daily. Observations: Decrease in fluency Assessment and Plan: Suspect radiation related inflammation as prior as etiology of these changes.  Findings are c/w burden of edema and midline shift observed on recent CT head.  Follow Up Instructions: Increase decadron to 4mg  BID until further notice.  Wife will call with an update next week, or if he continues to decline over the next several days.  Further decline will prompt more urgent contrast enhanced MRI and consideration of Avastin therapy to address inflammatory changes (if considered steroid refractory)  I discussed the assessment and treatment plan with the patient.  The patient was provided an opportunity to ask questions and all were answered.  The patient agreed with the plan and demonstrated understanding of the instructions.    The patient was advised to call back or seek an in-person evaluation if the symptoms worsen or if the condition fails to improve as  anticipated.  I provided 5-10 minutes of non-face-to-face time during this enocunter.  Ventura Sellers, MD   I provided 15 minutes of non face-to-face telephone visit time during this encounter, and > 50% was spent counseling as documented under my assessment & plan.

## 2018-12-23 NOTE — Telephone Encounter (Signed)
Error entering chart.

## 2018-12-23 NOTE — Telephone Encounter (Signed)
No los per 11/19 °

## 2018-12-27 NOTE — Progress Notes (Signed)
  Patient Name: Cody [name] MRN: [id number] DOB: [birth year] Referring Physician: Cecil Cobbs Date of Service: 12/13/2018 [facility], [place]                                                        End Of Treatment Note  Diagnoses: C71.4-Malignant neoplasm of occipital lobe  Glioblastoma  Intent: Curative  Radiation Treatment Dates: 11/02/2018 through 12/13/2018 Site Technique Total Dose (Gy) Dose per Fx (Gy) Completed Fx Beam Energies  Brain: Brain_L_occip IMRT 46/46 2 23/23 6X  Brain: Brain_Bst_L_occip IMRT 14/14 2 7/7 6X   Narrative: The patient tolerated radiation therapy relatively well with expected skin changes over scalp.    Plan: The patient will follow-up with radiation oncology in 1 month .   -----------------------------------  [name], MD

## 2019-01-05 ENCOUNTER — Other Ambulatory Visit: Payer: Self-pay

## 2019-01-05 ENCOUNTER — Ambulatory Visit (HOSPITAL_COMMUNITY)
Admission: RE | Admit: 2019-01-05 | Discharge: 2019-01-05 | Disposition: A | Discharge status: Home / Self Care | Payer: BC Managed Care – PPO | Source: Ambulatory Visit | Attending: Internal Medicine | Admitting: Internal Medicine

## 2019-01-05 DIAGNOSIS — C719 Malignant neoplasm of brain, unspecified: Secondary | ICD-10-CM | POA: Insufficient documentation

## 2019-01-05 IMAGING — MR MR HEAD WO/W CM
8 of 13 series · 31 of 48 positions shown · IV contrast (gadavist)
Comparison: MRI of the head with and without contrast [DATE].
CT head [DATE]

CLINICAL DATA: Left occipital GBM. Status post craniotomy and
resection [DATE]. The patient has had chemotherapy and
radiation. Progressive decline in speech and language. Steroid dose
was recently increased.

EXAM:
MRI HEAD WITHOUT AND WITH CONTRAST
TECHNIQUE: Multiplanar, multiecho pulse sequences of the brain and surrounding
structures were obtained without and with intravenous contrast.
CONTRAST:  10mL GADAVIST GADOBUTROL 1 MMOL/ML IV SOLN

[Series 4: DWI · axial · 3.0mm · 1.09mm/px · z∈[-29,+102]mm · 6 of 90 slices shown (1 of 4)]
[im 1/90]
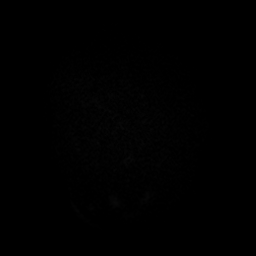
[im 18/90]
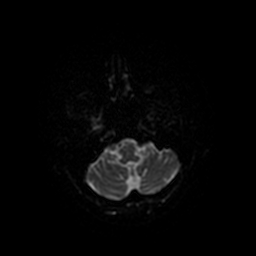
[im 36/90]
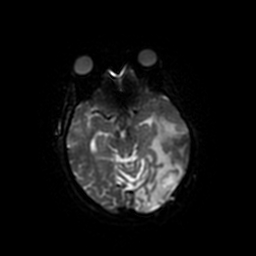
[im 54/90]
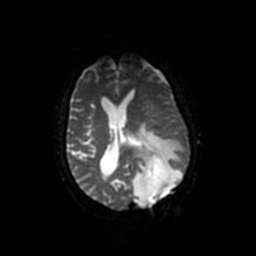
[im 72/90]
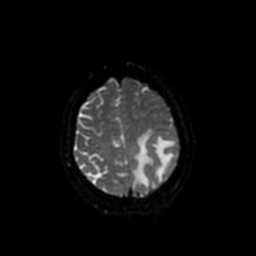
[im 90/90]
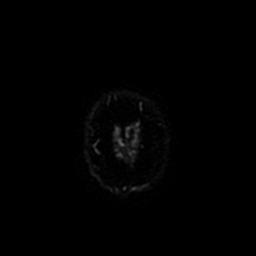

[Series 6: FLAIR · axial · 3.0mm · 0.45mm/px · z∈[-28,+110]mm · 2 of 24 slices shown]
[im 1/24]
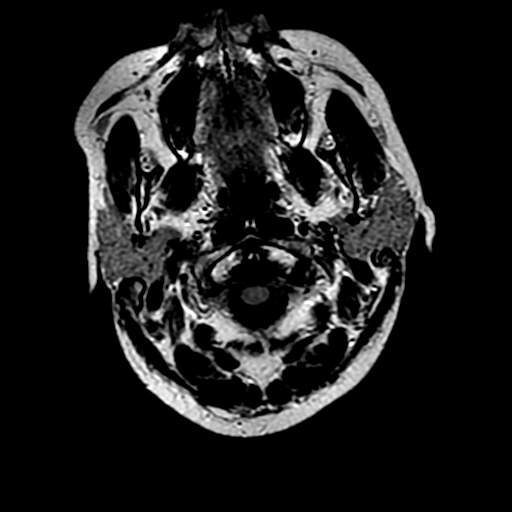
[im 24/24]
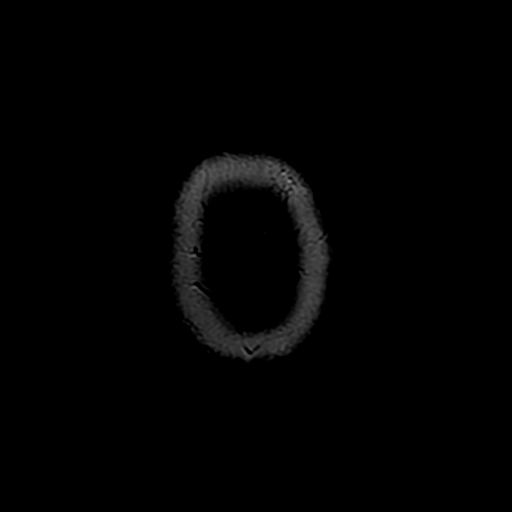

[Series 9: DWI · coronal · 4.0mm · 1.09mm/px · 6 of 88 slices shown (2 of 4)]
[im 1/88]
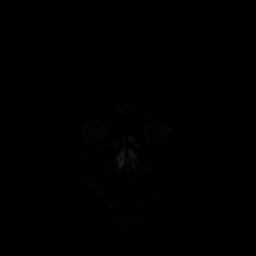
[im 18/88]
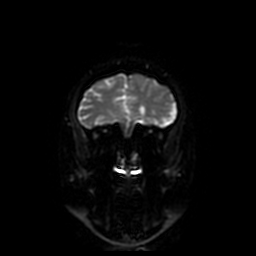
[im 35/88]
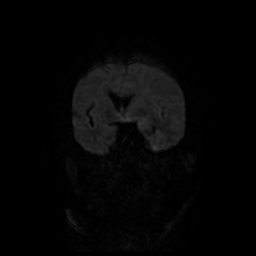
[im 53/88]
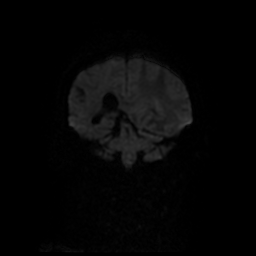
[im 70/88]
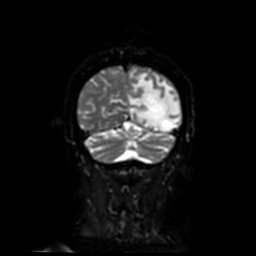
[im 88/88]
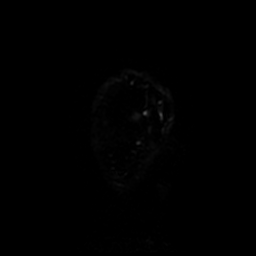

[Series 11: T1 post-contrast · axial · 1.0mm · 0.47mm/px · z∈[-26,+107]mm · 8 of 134 slices shown (1 of 3)]
[im 1/134]
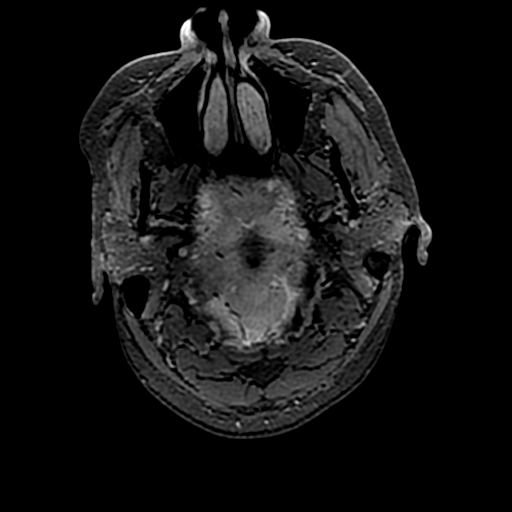
[im 17/134]
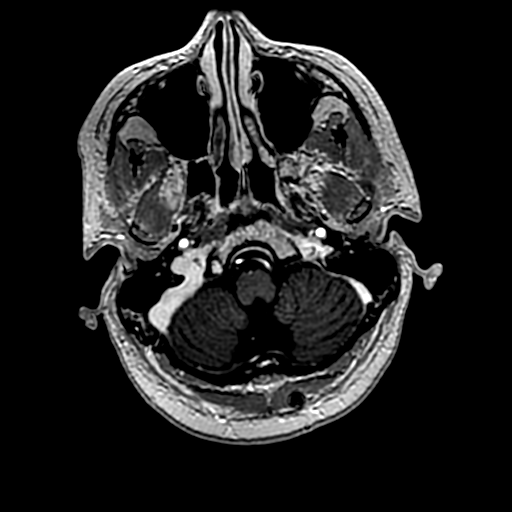
[im 34/134]
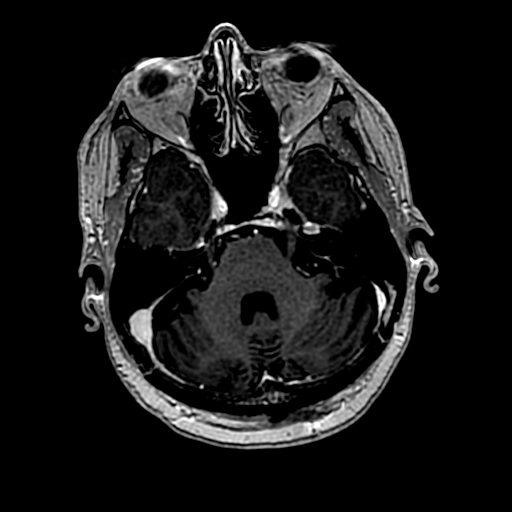
[im 50/134]
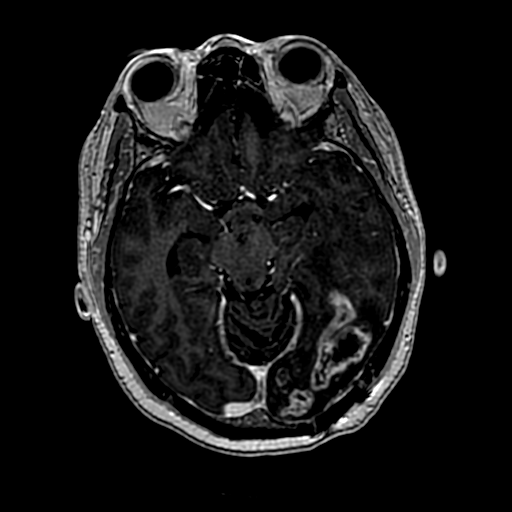
[im 84/134]
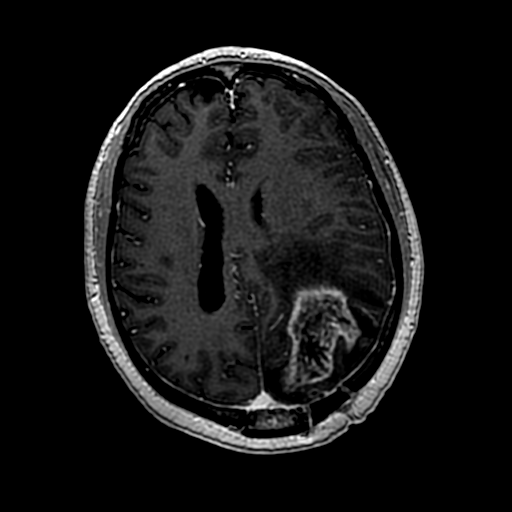
[im 100/134]
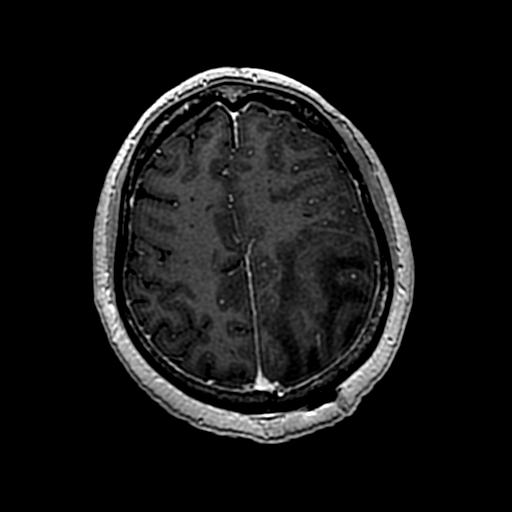
[im 117/134]
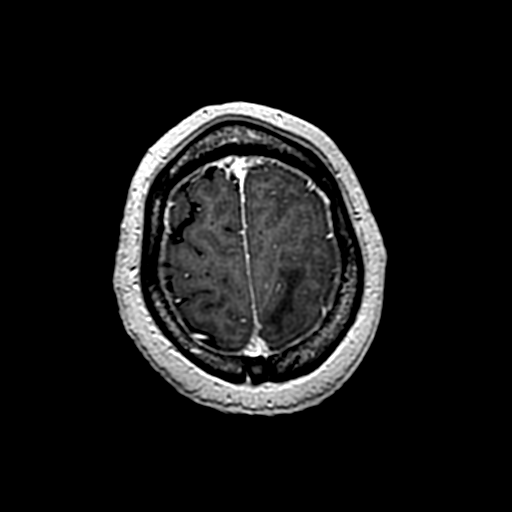
[im 134/134]
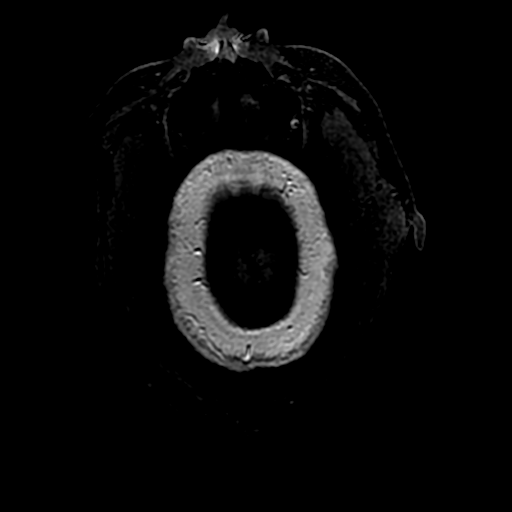

[Series 12: T1 post-contrast · coronal · 5.0mm · 0.47mm/px · 2 of 28 slices shown (2 of 3)]
[im 1/28]
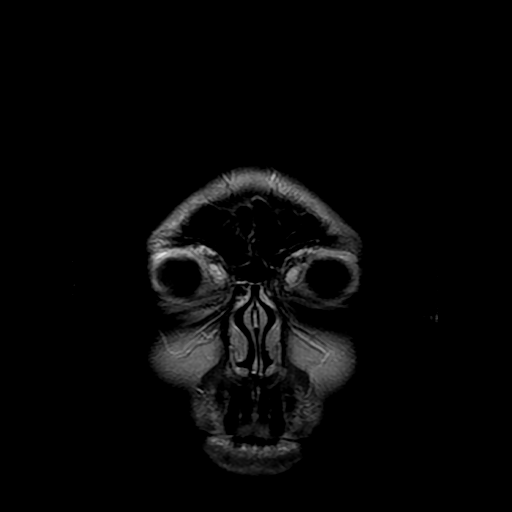
[im 28/28]
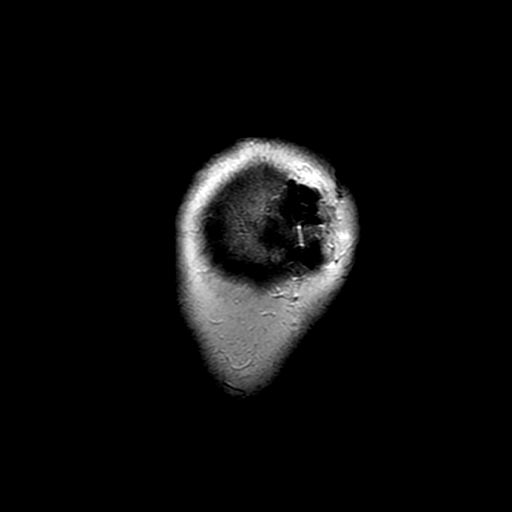

[Series 13: T1 post-contrast · sagittal · 5.0mm · 0.47mm/px · 1 of 22 slices shown (3 of 3)]
[im 1/22]
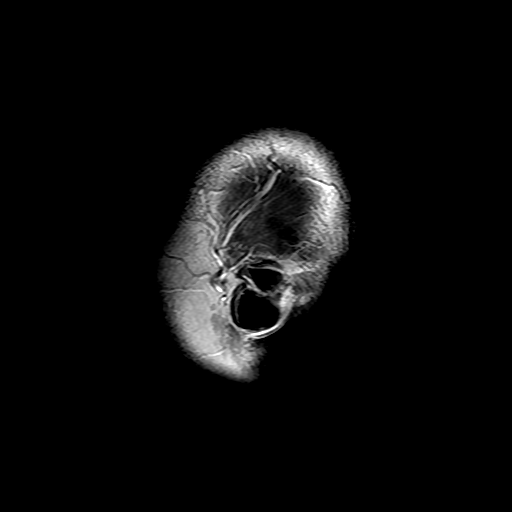

[Series 400: DWI · axial · 3.0mm · 1.09mm/px · z∈[-29,+102]mm · 3 of 45 slices shown (3 of 4)]
[im 1/45]
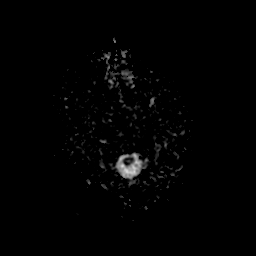
[im 23/45]
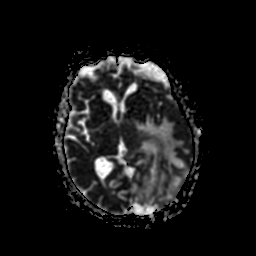
[im 45/45]
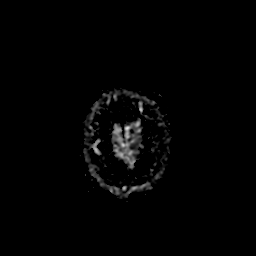

[Series 900: DWI · coronal · 4.0mm · 1.09mm/px · 3 of 44 slices shown (4 of 4)]
[im 1/44]
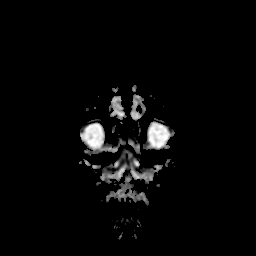
[im 22/44]
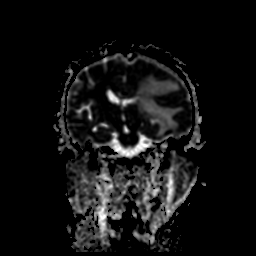
[im 44/44]
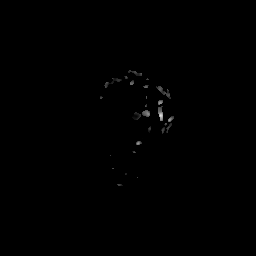

[31 of 48 positions shown; findings below may reference images not displayed]

FINDINGS: Brain: There is interval increase in size of a peripherally
enhancing mass lesion involving the left occipital lobe. Lesion
demonstrates thick walled border with heterogeneous internal
enhancement. The lesion measures 6.0 x 4.9 x 5.1 cm. This is
increased in size from the prior MRI measurements of 2.8 x 4.3 x
cm. Enhancement extends to the craniotomy site.

Progressive mass effect is present. Midline shift at the foramen of
FGLIBYA measures 5.5 cm. Posterior horn of the left lateral ventricle
is fully effaced. There is no right-sided hydrocephalus.

T2 signal change surrounding the enhancing lesion has progressed
significantly from the prior MRI. There is some progression from the
most recent CT scan with T2 signal extending into the posterior limb
of the left internal capsule and the left external capsule.

No distant enhancement is present.

Restricted diffusion is noted at the margins of the lesion,
corresponding to the areas of thickest enhancement.

Vascular: Flow is present in the major intracranial arteries.

Skull and upper cervical spine: The craniocervical junction is
normal. Upper cervical spine is within normal limits. Marrow signal
is unremarkable.

Sinuses/Orbits: The paranasal sinuses and mastoid air cells are
clear. The globes and orbits are within normal limits.
IMPRESSION: 1. Interval increase in size of a peripherally enhancing mass lesion
involving the left occipital lobe. The increasing restricted
diffusion suggest this is true progression of tumor rather than
pseudoprogression.
2. Continued progression of T2 signal changes surrounding the tumor
as described
3. Progressive mass effect and midline shift at the foramen of
FGLIBYA.

## 2019-01-05 MED ORDER — GADOBUTROL 1 MMOL/ML IV SOLN
10.0000 mL | Freq: Once | INTRAVENOUS | Status: AC | PRN
  Administered 2019-01-05: 18:00:00 10 mL via INTRAVENOUS

## 2019-01-09 ENCOUNTER — Inpatient Hospital Stay: Payer: BC Managed Care – PPO | Attending: Internal Medicine | Admitting: Internal Medicine

## 2019-01-09 ENCOUNTER — Other Ambulatory Visit: Payer: Self-pay

## 2019-01-09 ENCOUNTER — Inpatient Hospital Stay: Payer: BC Managed Care – PPO

## 2019-01-09 ENCOUNTER — Telehealth: Payer: Self-pay | Admitting: Pharmacist

## 2019-01-09 VITALS — BP 139/73 | HR 62 | Temp 99.1°F | Resp 18 | Ht 72.0 in | Wt 186.5 lb

## 2019-01-09 DIAGNOSIS — Z9221 Personal history of antineoplastic chemotherapy: Secondary | ICD-10-CM | POA: Insufficient documentation

## 2019-01-09 DIAGNOSIS — Z923 Personal history of irradiation: Secondary | ICD-10-CM | POA: Diagnosis not present

## 2019-01-09 DIAGNOSIS — R569 Unspecified convulsions: Secondary | ICD-10-CM | POA: Diagnosis not present

## 2019-01-09 DIAGNOSIS — C719 Malignant neoplasm of brain, unspecified: Secondary | ICD-10-CM

## 2019-01-09 DIAGNOSIS — Z7189 Other specified counseling: Secondary | ICD-10-CM

## 2019-01-09 DIAGNOSIS — N400 Enlarged prostate without lower urinary tract symptoms: Secondary | ICD-10-CM | POA: Diagnosis not present

## 2019-01-09 DIAGNOSIS — Z5112 Encounter for antineoplastic immunotherapy: Secondary | ICD-10-CM | POA: Insufficient documentation

## 2019-01-09 DIAGNOSIS — Z79899 Other long term (current) drug therapy: Secondary | ICD-10-CM | POA: Diagnosis not present

## 2019-01-09 DIAGNOSIS — C714 Malignant neoplasm of occipital lobe: Secondary | ICD-10-CM | POA: Insufficient documentation

## 2019-01-09 MED ORDER — TEMOZOLOMIDE 100 MG PO CAPS: 150.0000 mg/m2/d | ORAL_CAPSULE | Freq: Every day | ORAL | 0 refills | Status: DC

## 2019-01-09 MED ORDER — ONDANSETRON HCL 8 MG PO TABS: 8.0000 mg | ORAL_TABLET | Freq: Two times a day (BID) | ORAL | 1 refills | Status: DC | PRN

## 2019-01-09 NOTE — Progress Notes (Signed)
DISCONTINUE ON PATHWAY REGIMEN - Neuro     One cycle, daily for 42 days concurrent with RT:     Temozolomide   **Always confirm dose/schedule in your pharmacy ordering system**  REASON: Continuation Of Treatment PRIOR TREATMENT: [id number]: Radiation Therapy with Concurrent Temozolomide 75 mg/m2 Daily x 6 Weeks, Followed by Sequential Temozolomide TREATMENT RESPONSE: Progressive Disease (PD)  START ON PATHWAY REGIMEN - Neuro     A cycle is every 28 days:     Temozolomide      Temozolomide   **Always confirm dose/schedule in your pharmacy ordering system**  Patient Characteristics: Glioblastoma (Grade IV Glioma), Newly Diagnosed / Treatment Naive, Good Performance Status and/or Younger Patient, MGMT Promoter Unmethylated/Unknown Disease Classification: Glioma Disease Classification: Glioblastoma (Grade IV Glioma) Disease Status: Newly Diagnosed / Treatment Naive Performance Status: Good Performance Status and/or Younger Patient MGMT Promoter Methylation Status: Awaiting Test Results Intent of Therapy: Non-Curative / Palliative Intent, Discussed with Patient

## 2019-01-09 NOTE — Progress Notes (Signed)
[facility] [place], [place] [address] [phone]   Interval Evaluation  Date of Service: 01/09/19 Patient Name: Cody [name] Patient MRN: [id number] Patient DOB: 1960/09/25 Provider: Ventura Sellers, MD  Identifying Statement:  Cody [name] is a 58 y.o. male with left occipital glioblastoma   Oncologic History: Oncology History  Glioblastoma ([place])  09/29/2018 Surgery   Craniotomy, resection with Dr. Kathyrn Sheriff   10/21/2018 -  Chemotherapy   The patient had [No matching medication found in this treatment plan]  for chemotherapy treatment.      Biomarkers:  MGMT Unknown.  IDH 1/2 Wild type.  EGFR Unknown  TERT Unknown   Interval History:  Cody [name] presents today for follow up now having completed radiation therapy.  Cody [name] and his wife describe continued gradual decline in language expression even since increasing dexamethasone to 60m twice per day.  Vision on the right side has also darkened. There is accompanying confusion and memory impairment, but Cody [name] still maintains full functional status otherwise.  No issues with gait or motor control. Cody [name] has had no recurrent seizure events.  H+P (10/18/18) Patient presented to medical attention in August 2020 with several weeks history of new onset headaches.  Progressive nature of symptoms led to an ED admission, where CNS imaging demonstrated enhancing mass in the left occipital lobe.  Cody [name] underwent craniotomy and resection with Dr. [name] 80/17/51without complication.  Cody [name] was discharged to home with small visual field impairment and otherwise functionally intact.  Cody [name] denies headaches or any seizures since surgery, having completed decadron taper.    Medications: Current Outpatient Medications on File Prior to Visit  Medication Sig Dispense Refill  . dexamethasone (DECADRON) 4 MG tablet Take 1 tablet (4 mg total) by mouth 2 (two) times daily. 30 tablet 1  .  finasteride (PROSCAR) 5 MG tablet Take 5 mg by mouth daily.    .Marland KitchenlevETIRAcetam (KEPPRA) 500 MG tablet Take 2 tablets (1,000 mg total) by mouth 2 (two) times daily. 60 tablet 3  . tamsulosin (FLOMAX) 0.4 MG CAPS capsule Take 0.4 mg by mouth daily.    .Marland KitchenHYDROcodone-acetaminophen (NORCO/VICODIN) 5-325 MG tablet Take 1-2 tablets by mouth every 6 (six) hours as needed. (Patient not taking: Reported on 01/09/2019) 8 tablet 0  . ondansetron (ZOFRAN) 8 MG tablet Take 1 tablet (8 mg total) by mouth 2 (two) times daily as needed (nausea and vomiting). May take 30-60 minutes prior to Temodar administration if nausea/vomiting occurs. (Patient not taking: Reported on 01/09/2019) 30 tablet 1  . temozolomide (TEMODAR) 140 MG capsule Take 1 capsule (140 mg total) by mouth daily. May take on an empty stomach to decrease nausea & vomiting. (Patient not taking: Reported on 01/09/2019) 42 capsule 0  . traMADol (ULTRAM) 50 MG tablet TAKE 1 TABLET(50 MG) BY MOUTH EVERY 6 HOURS AS NEEDED FOR SEVERE PAIN (Patient not taking: Reported on 01/09/2019) 10 tablet 0   No current facility-administered medications on file prior to visit.     Allergies: No Known Allergies Past Medical History:  Past Medical History:  Diagnosis Date  . Enlarged prostate   . Migraines    Past Surgical History:  Past Surgical History:  Procedure Laterality Date  . APPLICATION OF CRANIAL NAVIGATION Left 09/29/2018   Procedure: APPLICATION OF CRANIAL NAVIGATION;  Surgeon: NConsuella Lose MD;  Location: [facility]  Service: Neurosurgery;  Laterality: Left;  . CRANIOTOMY Left 09/29/2018   Procedure: Sterotactic  Left craniotomy for resection of tumor;  Surgeon: [name], MD;  Location: [facility];  Service: Neurosurgery;  Laterality: Left;  Sterotactic Left craniotomy for resection of tumor   Social History:  Social History   Socioeconomic History  . Marital status: Married    Spouse name: Not on file  . Number of children: Not on file  .  Years of education: Not on file  . Highest education level: Not on file  Occupational History  . Not on file  Social Needs  . Financial resource strain: Not on file  . Food insecurity    Worry: Not on file    Inability: Not on file  . Transportation needs    Medical: Yes    Non-medical: No  Tobacco Use  . Smoking status: Never Smoker  . Smokeless tobacco: Never Used  Substance and Sexual Activity  . Alcohol use: Never    Frequency: Never  . Drug use: Never  . Sexual activity: Not on file  Lifestyle  . Physical activity    Days per week: Not on file    Minutes per session: Not on file  . Stress: Not on file  Relationships  . Social Herbalist on phone: Not on file    Gets together: Not on file    Attends religious service: Not on file    Active member of club or organization: Not on file    Attends meetings of clubs or organizations: Not on file    Relationship status: Not on file  . Intimate partner violence    Fear of current or ex partner: No    Emotionally abused: No    Physically abused: No    Forced sexual activity: No  Other Topics Concern  . Not on file  Social History Narrative  . Not on file   Family History: No family history on file.  Review of Systems: Constitutional: Denies fevers, chills or abnormal weight loss Eyes: Denies blurriness of vision Ears, nose, mouth, throat, and face: Denies mucositis or sore throat Respiratory: Denies cough, dyspnea or wheezes Cardiovascular: Denies palpitation, chest discomfort or lower extremity swelling Gastrointestinal:  Denies nausea, constipation, diarrhea GU: Denies dysuria or incontinence Skin: Denies abnormal skin rashes Neurological: Per HPI Musculoskeletal: Denies joint pain, back or neck discomfort. No decrease in ROM Behavioral/Psych: Denies anxiety, disturbance in thought content, and mood instability  Physical Exam: Vitals:   01/09/19 1117  BP: 139/73  Pulse: 62  Resp: 18  Temp: 99.1  F (37.3 C)  SpO2: 100%   KPS: 80. General: Alert, cooperative, pleasant, in no acute distress Head: Craniotomy scar noted, dry and intact. EENT: No conjunctival injection or scleral icterus. Oral mucosa moist Lungs: Resp effort normal Cardiac: Regular rate and rhythm Abdomen: Soft, non-distended abdomen Skin: No rashes cyanosis or petechiae. Extremities: No clubbing or edema  Neurologic Exam: Mental Status: Awake, alert, attentive to examiner. Oriented to self and environment. Language notable for mixed dysphasia motor>sensory. Cranial Nerves: Visual acuity is grossly normal. Right homonymous hemianopia. Extra-ocular movements intact. No ptosis. Face is symmetric, tongue midline. Motor: Tone and bulk are normal. Power is full in both arms and legs. Reflexes are symmetric, no pathologic reflexes present. Intact finger to nose bilaterally Sensory: Intact to light touch and temperature Gait: Deferred  Labs: I have reviewed the data as listed    Component Value Date/Time   NA 138 12/16/2018 1407   K 4.4 12/16/2018 1407   CL 101 12/16/2018 1407  CO2 30 12/16/2018 1407   GLUCOSE 112 (H) 12/16/2018 1407   BUN 16 12/16/2018 1407   CREATININE 1.29 (H) 12/16/2018 1407   CREATININE 1.54 (H) 12/13/2018 1513   CALCIUM 8.8 (L) 12/16/2018 1407   PROT 6.5 12/13/2018 1513   ALBUMIN 3.8 12/13/2018 1513   AST 12 (L) 12/13/2018 1513   ALT 23 12/13/2018 1513   ALKPHOS 100 12/13/2018 1513   BILITOT 0.3 12/13/2018 1513   GFRNONAA >60 12/16/2018 1407   GFRNONAA 50 (L) 12/13/2018 1513   GFRAA >60 12/16/2018 1407   GFRAA 58 (L) 12/13/2018 1513   Lab Results  Component Value Date   WBC 15.0 (H) 12/16/2018   NEUTROABS 13.7 (H) 12/13/2018   HGB 15.6 12/16/2018   HCT 45.9 12/16/2018   MCV 86.3 12/16/2018   PLT 169 12/16/2018   Imaging:  [place] [facility] Clinician Interpretation: I have personally reviewed the CNS images as listed.  My interpretation, in the context of the patient's clinical  presentation, is progressive disease  Ct Head Wo Contrast  Result Date: 12/16/2018 CLINICAL DATA:  Glioblastoma, headache EXAM: CT HEAD WITHOUT CONTRAST TECHNIQUE: Contiguous axial images were obtained from the base of the skull through the vertex without intravenous contrast. COMPARISON:  October 28 2018 FINDINGS: Brain: There are postoperative changes of left parietal craniotomy. No acute intracranial hemorrhage. Extensive confluent hypoattenuation is present in the posterior left cerebral hemisphere as seen previously. However, there is increased extent, for example now with greater involvement of the temporal lobe. Regional mass effect is increased and there is now approximately 6 mm of rightward midline shift at the level the septum pellucidum. Bilateral inferior frontal encephalomalacia. No new loss of gray-white differentiation. There is no hydrocephalus. Vascular: No hyperdense vessel. Skull: Unremarkable apart from craniotomy. Sinuses/Orbits: Aerated.  Unremarkable. Other: None. IMPRESSION: Increase in extensive confluent hypoattenuation in the left cerebral hemisphere, which may reflect edema and/or infiltrating tumor. Increased regional mass effect including new mild rightward midline shift. No acute intracranial hemorrhage. Electronically Signed   By: Macy Mis M.D.   On: 12/16/2018 15:39   Mr Jeri Cos ZS Contrast  Result Date: 01/06/2019 CLINICAL DATA:  Left occipital GBM. Status post craniotomy and resection 09/29/2018. The patient has had chemotherapy and radiation. Progressive decline in speech and language. Steroid dose was recently increased. EXAM: MRI HEAD WITHOUT AND WITH CONTRAST TECHNIQUE: Multiplanar, multiecho pulse sequences of the brain and surrounding structures were obtained without and with intravenous contrast. CONTRAST:  63m GADAVIST GADOBUTROL 1 MMOL/ML IV SOLN COMPARISON:  MRI of the head with and without contrast 10/28/2018. CT head 12/26/2018 FINDINGS: Brain: There  is interval increase in size of a peripherally enhancing mass lesion involving the left occipital lobe. Lesion demonstrates thick walled border with heterogeneous internal enhancement. The lesion measures 6.0 x 4.9 x 5.1 cm. This is increased in size from the prior MRI measurements of 2.8 x 4.3 x 3.4 cm. Enhancement extends to the craniotomy site. Progressive mass effect is present. Midline shift at the foramen of Monro measures 5.5 cm. Posterior horn of the left lateral ventricle is fully effaced. There is no right-sided hydrocephalus. T2 signal change surrounding the enhancing lesion has progressed significantly from the prior MRI. There is some progression from the most recent CT scan with T2 signal extending into the posterior limb of the left internal capsule and the left external capsule. No distant enhancement is present. Restricted diffusion is noted at the margins of the lesion, corresponding to the areas of thickest enhancement. Vascular: Flow  is present in the major intracranial arteries. Skull and upper cervical spine: The craniocervical junction is normal. Upper cervical spine is within normal limits. Marrow signal is unremarkable. Sinuses/Orbits: The paranasal sinuses and mastoid air cells are clear. The globes and orbits are within normal limits. IMPRESSION: 1. Interval increase in size of a peripherally enhancing mass lesion involving the left occipital lobe. The increasing restricted diffusion suggest this is true progression of tumor rather than pseudoprogression. 2. Continued progression of T2 signal changes surrounding the tumor as described 3. Progressive mass effect and midline shift at the foramen of Monro. Electronically Signed   By: San Morelle M.D.   On: 01/06/2019 05:01     Assessment/Plan Glioblastoma ([place])  Focal seizures [facility])  Cody [name] unfortunately is clinically and radiographically progressive today, now having completed radiation therapy.    We extensively  discussed various treatment pathways given decline in language and degree of change noted on MRI.  Goals of care were also discussed; patient prefers continuing with anti-tumor therapy at this time.  We recommended initiating treatment with Temozolomide 131m/m2, on for five days and off for twenty three days in twenty eight day cycles. The patient will have a complete blood count performed on days 21 and 28 of each cycle, and a comprehensive metabolic panel performed on day 28 of each cycle. Labs may need to be performed more often. Zofran will prescribed for home use for nausea/vomiting.   Because of "steroid failure", very high rate of volumetric change, and worsening clinical symptoms, we also recommended concurrent initiation of Avastin, 130mkg IV q2 weeks.  We reviewed side effects including hypertension, bleeding/clotting events, and impaired wound healing.  Avastin should be held for the following:  ANC less than 500  Platelets less than 50,000  LFT or creatinine greater than 2x ULN  If clinical concerns/contraindications develop  Chemotherapy should be held for the following:  ANC less than 1,000  Platelets less than 100,000  LFT or creatinine greater than 2x ULN  If clinical concerns/contraindications develop  We also introduced and discussed tumor-treating fields as an adjuvant treatment modality for glioblastoma.  Cody [name] will get back to usKorean the coming days/weeks with a decision regarding TTF.  For now Cody [name] should continue high dose decadron 66m566mID, until Avastin therapy is started.  Keppra will con't 1000m66mD.  We appreciate the opportunity to participate in the care of Cody [name] Cody [name] will return to clinic for shortly for initial Avastin infusion.  All questions were answered. The patient knows to call the clinic with any problems, questions or concerns. No barriers to learning were detected.  The total time spent in the encounter was 40 minutes and more  than 50% was on counseling and review of test results   ZachVentura Sellers Medical Director of Neuro-Oncology [facility] LLCWeslMantador07/20 3:42 PM

## 2019-01-09 NOTE — Telephone Encounter (Signed)
Oral Oncology Pharmacist Encounter  Received new prescription for Temodar (temolozomide) for the treatment of glioblastoma in conjunction with bevacizumab, planned duration until disease progression or unacceptable drug toxicity.  CBC/CMP from 12/13/2018 assessed, no relevant lab abnormalities. Repeat labs ordered. Prescription dose and frequency assessed.   Current medication list in Epic reviewed, no DDIs with temozolomide identified.  Prescription has been e-scribed to the [facility] for benefits analysis and approval.  Oral Oncology Clinic will continue to follow for insurance authorization, copayment issues, initial counseling and start date.  Darl Pikes, PharmD, BCPS, [facility] Hematology/Oncology Clinical Pharmacist [facility] [phone]  01/09/2019 4:17 PM

## 2019-01-10 ENCOUNTER — Telehealth: Payer: Self-pay

## 2019-01-10 ENCOUNTER — Telehealth: Payer: Self-pay | Admitting: Internal Medicine

## 2019-01-10 NOTE — Telephone Encounter (Signed)
Oral Oncology Patient Advocate Encounter  After completing a benefits investigation, prior authorization for Temodar is not required at this time through [employer].  Patient's copay is $0.  [place] Patient [place] Phone [phone] Fax [phone] 01/10/2019 8:22 AM

## 2019-01-10 NOTE — Telephone Encounter (Signed)
No los per 1/27. 

## 2019-01-11 ENCOUNTER — Other Ambulatory Visit: Payer: Self-pay | Admitting: Internal Medicine

## 2019-01-11 ENCOUNTER — Telehealth: Payer: Self-pay | Admitting: Internal Medicine

## 2019-01-11 MED FILL — TEMOZOLOMIDE 100 MG CAPS: 100 | 5 days supply | Qty: 15 | Fill #0

## 2019-01-11 NOTE — Telephone Encounter (Signed)
Oral Chemotherapy Pharmacist Encounter  Medication will be delivered tomorrow 01/12/2019. Mr. Gowda knows to start his medication on Monday 01/16/2019.  Patient Education I spoke with patient for overview of new oral chemotherapy medication: Temodar (temolozomide) for the treatment of glioblastoma in conjunction with bevacizumab, planned duration until disease progression or unacceptable drug toxicity.  Pt is doing well. Counseled patient on administration, dosing, side effects, monitoring, drug-food interactions, safe handling, storage, and disposal. Patient will take 3 capsules (300 mg total) by mouth daily. May take on an empty stomach to decrease nausea & vomiting. Take for 5 days, then hold for 23 days. Repeat every 28 days.  Side effects include but not limited to: decreased wbc/hgb/plt, fatigue, N/V, HA, constipation.    Reviewed with patient importance of keeping a medication schedule and plan for any missed doses.  Mr. Millet voiced understanding and appreciation. All questions answered.  Provided patient with Oral [facility] phone number. Patient knows to call the office with questions or concerns. Oral Chemotherapy Navigation Clinic will continue to follow.  Darl Pikes, PharmD, BCPS, [facility] Hematology/Oncology Clinical Pharmacist [facility] [phone]  01/11/2019 3:48 PM

## 2019-01-11 NOTE — Telephone Encounter (Signed)
Scheduled appt per 1/28 sch message - pt is aware of appt date and time   

## 2019-01-12 NOTE — Telephone Encounter (Signed)
Please review Rx request refill.

## 2019-01-12 NOTE — Progress Notes (Signed)
I called the patient today about their upcoming follow-up appointment in radiation oncology.   Given the state of the  COVID-19 pandemic, concerning case numbers in our community, and guidance from [facility], I offered a phone assessment with the patient to determine if coming to the clinic was necessary. The patient accepted.  I let the patient know that I had spoken with Dr. Isidore Moos, and she wanted them to know the importance of washing their hands for at least 20 seconds at a time, especially after going out in public, and before they eat. Limit going out in public whenever possible. Do not touch your face, unless your hands are clean, such as when bathing. Get plenty of rest, eat well, and stay hydrated.   Symptomatically, the patient is doing relatively well. They report no concerns related to radiation treatment.   All questions were answered to the patient's satisfaction.  I encouraged the patient to call with any further questions. Otherwise, the plan is to see Dr. Mickeal Skinner on 01/23/19.    Patient is pleased with this plan, and we will cancel their upcoming follow-up to reduce the risk of COVID-19 transmission.

## 2019-01-13 ENCOUNTER — Ambulatory Visit
Admission: RE | Admit: 2019-01-13 | Payer: BC Managed Care – PPO | Source: Ambulatory Visit | Admitting: Radiation Oncology

## 2019-01-16 ENCOUNTER — Telehealth: Payer: Self-pay | Admitting: *Deleted

## 2019-01-16 ENCOUNTER — Other Ambulatory Visit: Payer: Self-pay | Admitting: Internal Medicine

## 2019-01-16 MED ORDER — TRAMADOL HCL 50 MG PO TABS: ORAL_TABLET | ORAL | 1 refills | Status: DC

## 2019-01-16 NOTE — Telephone Encounter (Signed)
Patient called back to also report that he has been having recurring headaches and he is out of the Tramadol that was previously prescribed for the Headaches.  He states that he has tried Aleve and Extra Strength Tylenol without any improvement.  He is requesting a refill of Tramadol.  Routed to MD,

## 2019-01-16 NOTE — Telephone Encounter (Signed)
He may start the Temodar now, doesn't need to wait until next week.  Will refill the Tramadol.  Ventura Sellers, MD

## 2019-01-16 NOTE — Telephone Encounter (Signed)
Patient called thought his Avastin appt was today.  Advised that it is scheduled for 01/23/2019 and is still pending insurance authorization.  He further questioned when he is supposed to start his Temodar since he has gotten it already.  Does he wait to start the same day he has his Avastin or does he start it now since he has it on hand.  Routed to MD pending response.

## 2019-01-16 NOTE — Addendum Note (Signed)
Addended by: Ventura Sellers on: 01/16/2019 10:35 AM   Modules accepted: Orders

## 2019-01-16 NOTE — Telephone Encounter (Signed)
Returned call to patient.  Advised he could start Temodar and will plan for Avastin pending insurance authorization 12/21.  Tramadol refilled to pharmacy for patient by Dr. Mickeal Skinner.

## 2019-01-23 ENCOUNTER — Inpatient Hospital Stay: Payer: BC Managed Care – PPO

## 2019-01-23 ENCOUNTER — Other Ambulatory Visit: Payer: Self-pay

## 2019-01-23 ENCOUNTER — Inpatient Hospital Stay (HOSPITAL_BASED_OUTPATIENT_CLINIC_OR_DEPARTMENT_OTHER): Payer: BC Managed Care – PPO | Admitting: Internal Medicine

## 2019-01-23 VITALS — BP 139/82

## 2019-01-23 VITALS — BP 149/78 | HR 56 | Temp 98.3°F | Resp 17 | Ht 72.0 in | Wt 189.5 lb

## 2019-01-23 DIAGNOSIS — C719 Malignant neoplasm of brain, unspecified: Secondary | ICD-10-CM | POA: Diagnosis not present

## 2019-01-23 DIAGNOSIS — C714 Malignant neoplasm of occipital lobe: Secondary | ICD-10-CM | POA: Diagnosis not present

## 2019-01-23 LAB — CBC WITH DIFFERENTIAL (CANCER CENTER ONLY)
Abs Immature Granulocytes: 0.62 10*3/uL — ABNORMAL HIGH (ref 0.00–0.07)
Basophils Absolute: 0 10*3/uL (ref 0.0–0.1)
Basophils Relative: 0 %
Eosinophils Absolute: 0 10*3/uL (ref 0.0–0.5)
Eosinophils Relative: 0 %
HCT: 42.5 % (ref 39.0–52.0)
Hemoglobin: 14.9 g/dL (ref 13.0–17.0)
Immature Granulocytes: 5 %
Lymphocytes Relative: 4 %
Lymphs Abs: 0.5 10*3/uL — ABNORMAL LOW (ref 0.7–4.0)
MCH: 29.7 pg (ref 26.0–34.0)
MCHC: 35.1 g/dL (ref 30.0–36.0)
MCV: 84.8 fL (ref 80.0–100.0)
Monocytes Absolute: 0.7 10*3/uL (ref 0.1–1.0)
Monocytes Relative: 6 %
Neutro Abs: 10.6 10*3/uL — ABNORMAL HIGH (ref 1.7–7.7)
Neutrophils Relative %: 85 %
Platelet Count: 113 10*3/uL — ABNORMAL LOW (ref 150–400)
RBC: 5.01 MIL/uL (ref 4.22–5.81)
RDW: 14.9 % (ref 11.5–15.5)
WBC Count: 12.4 10*3/uL — ABNORMAL HIGH (ref 4.0–10.5)
nRBC: 0 % (ref 0.0–0.2)

## 2019-01-23 LAB — CMP (CANCER CENTER ONLY)
ALT: 60 U/L — ABNORMAL HIGH (ref 0–44)
AST: 17 U/L (ref 15–41)
Albumin: 3.5 g/dL (ref 3.5–5.0)
Alkaline Phosphatase: 93 U/L (ref 38–126)
Anion gap: 11 (ref 5–15)
BUN: 20 mg/dL (ref 6–20)
CO2: 25 mmol/L (ref 22–32)
Calcium: 8.4 mg/dL — ABNORMAL LOW (ref 8.9–10.3)
Chloride: 102 mmol/L (ref 98–111)
Creatinine: 1.12 mg/dL (ref 0.61–1.24)
GFR, Est AFR Am: >60 mL/min (ref >60)
GFR, Estimated: >60 mL/min (ref >60)
Glucose, Bld: 169 mg/dL — ABNORMAL HIGH (ref 70–99)
Potassium: 4.4 mmol/L (ref 3.5–5.1)
Sodium: 138 mmol/L (ref 135–145)
Total Bilirubin: 0.5 mg/dL (ref 0.3–1.2)
Total Protein: 6 g/dL — ABNORMAL LOW (ref 6.5–8.1)

## 2019-01-23 MED ORDER — SODIUM CHLORIDE 0.9 % IV SOLN
Freq: Once | INTRAVENOUS | Status: AC
  Filled 2019-01-23: qty 250

## 2019-01-23 MED ORDER — SODIUM CHLORIDE 0.9 % IV SOLN
10.0000 mg/kg | Freq: Once | INTRAVENOUS | Status: AC
  Administered 2019-01-23: 800 mg via INTRAVENOUS
  Filled 2019-01-23: qty 32

## 2019-01-23 NOTE — Patient Instructions (Addendum)
[facility] Discharge Instructions for Patients Receiving Chemotherapy  Today you received the following chemotherapy agents Bevacizumab-bvzr  To help prevent nausea and vomiting after your treatment, we encourage you to take your nausea medication as directed.    If you develop nausea and vomiting that is not controlled by your nausea medication, call the clinic.   BELOW ARE SYMPTOMS THAT SHOULD BE REPORTED IMMEDIATELY:  *FEVER GREATER THAN 100.5 F  *CHILLS WITH OR WITHOUT FEVER  NAUSEA AND VOMITING THAT IS NOT CONTROLLED WITH YOUR NAUSEA MEDICATION  *UNUSUAL SHORTNESS OF BREATH  *UNUSUAL BRUISING OR BLEEDING  TENDERNESS IN MOUTH AND THROAT WITH OR WITHOUT PRESENCE OF ULCERS  *URINARY PROBLEMS  *BOWEL PROBLEMS  UNUSUAL RASH Items with * indicate a potential emergency and should be followed up as soon as possible.  Feel free to call the clinic should you have any questions or concerns. The clinic phone number is [phone].  Please show the [place] at check-in to the Emergency Department and triage nurse.   Bevacizumab injection What is this medicine? BEVACIZUMAB (be va SIZ yoo mab) is a monoclonal antibody. It is used to treat many types of cancer. This medicine may be used for other purposes; ask your health care provider or pharmacist if you have questions. COMMON BRAND NAME(S): Avastin, MVASI, Zirabev What should I tell my health care provider before I take this medicine? They need to know if you have any of these conditions:  diabetes  heart disease  high blood pressure  history of coughing up blood  prior anthracycline chemotherapy (e.g., doxorubicin, daunorubicin, epirubicin)  recent or ongoing radiation therapy  recent or planning to have surgery  stroke  an unusual or allergic reaction to bevacizumab, hamster proteins, mouse proteins, other medicines, foods, dyes, or preservatives  pregnant or trying to get  pregnant  breast-feeding How should I use this medicine? This medicine is for infusion into a vein. It is given by a health care professional in a hospital or clinic setting. Talk to your pediatrician regarding the use of this medicine in children. Special care may be needed. Overdosage: If you think you have taken too much of this medicine contact a poison control center or emergency room at once. NOTE: This medicine is only for you. Do not share this medicine with others. What if I miss a dose? It is important not to miss your dose. Call your doctor or health care professional if you are unable to keep an appointment. What may interact with this medicine? Interactions are not expected. This list may not describe all possible interactions. Give your health care provider a list of all the medicines, herbs, non-prescription drugs, or dietary supplements you use. Also tell them if you smoke, drink alcohol, or use illegal drugs. Some items may interact with your medicine. What should I watch for while using this medicine? Your condition will be monitored carefully while you are receiving this medicine. You will need important blood work and urine testing done while you are taking this medicine. This medicine may increase your risk to bruise or bleed. Call your doctor or health care professional if you notice any unusual bleeding. This medicine should be started at least 28 days following major surgery and the site of the surgery should be totally healed. Check with your doctor before scheduling dental work or surgery while you are receiving this treatment. Talk to your doctor if you have recently had surgery or if you have a wound that  has not healed. Do not become pregnant while taking this medicine or for 6 months after stopping it. Women should inform their doctor if they wish to become pregnant or think they might be pregnant. There is a potential for serious side effects to an unborn child. Talk  to your health care professional or pharmacist for more information. Do not breast-feed an infant while taking this medicine and for 6 months after the last dose. This medicine has caused ovarian failure in some women. This medicine may interfere with the ability to have a child. You should talk to your doctor or health care professional if you are concerned about your fertility. What side effects may I notice from receiving this medicine? Side effects that you should report to your doctor or health care professional as soon as possible:  allergic reactions like skin rash, itching or hives, swelling of the face, lips, or tongue  chest pain or chest tightness  chills  coughing up blood  high fever  seizures  severe constipation  signs and symptoms of bleeding such as bloody or black, tarry stools; red or dark-brown urine; spitting up blood or brown material that looks like coffee grounds; red spots on the skin; unusual bruising or bleeding from the eye, gums, or nose  signs and symptoms of a blood clot such as breathing problems; chest pain; severe, sudden headache; pain, swelling, warmth in the leg  signs and symptoms of a stroke like changes in vision; confusion; trouble speaking or understanding; severe headaches; sudden numbness or weakness of the face, arm or leg; trouble walking; dizziness; loss of balance or coordination  stomach pain  sweating  swelling of legs or ankles  vomiting  weight gain Side effects that usually do not require medical attention (report to your doctor or health care professional if they continue or are bothersome):  back pain  changes in taste  decreased appetite  dry skin  nausea  tiredness This list may not describe all possible side effects. Call your doctor for medical advice about side effects. You may report side effects to FDA at 1-800-FDA-1088. Where should I keep my medicine? This drug is given in a hospital or clinic and will  not be stored at home. NOTE: This sheet is a summary. It may not cover all possible information. If you have questions about this medicine, talk to your doctor, pharmacist, or health care provider.  2020 Elsevier/Gold Standard (2016-01-17 14:33:29)

## 2019-01-23 NOTE — Progress Notes (Signed)
Per MD Vaslow, ok to proceed without urine protein today

## 2019-01-23 NOTE — Progress Notes (Signed)
[facility] [address], [place] [address] [phone]   Interval Evaluation  Date of Service: 01/23/19 Patient Name: Cody [name] Patient MRN: [id number] Patient DOB: 04-06-1960 Provider: Ventura Sellers, MD  Identifying Statement:  Cody [name] is a 58 y.o. male with left occipital glioblastoma   Oncologic History: Oncology History  Glioblastoma ([place])  09/29/2018 Surgery   Craniotomy, resection with Dr. Kathyrn Sheriff   10/21/2018 - 10/31/2018 Chemotherapy   The patient had [No matching medication found in this treatment plan]  for chemotherapy treatment.    01/09/2019 -  Chemotherapy   The patient had [No matching medication found in this treatment plan]  for chemotherapy treatment.    01/23/2019 -  Chemotherapy   The patient had bevacizumab-bvzr (ZIRABEV) 800 mg in sodium chloride 0.9 % 100 mL chemo infusion, 10 mg/kg = 800 mg, Intravenous,  Once, 0 of 6 cycles  for chemotherapy treatment.      Biomarkers:  MGMT Unknown.  IDH 1/2 Wild type.  EGFR Unknown  TERT Unknown   Interval History:  Cody [name] presents today for first avastin infusion.  He and his wife describe modest improvement in language expression since increasing dexamethasone to 61m twice per day.  Vision on the right side is still impaired as prior. He maintains full functional status otherwise.  No issues with gait or motor control. He has had no recurrent seizure events.  H+P (10/18/18) Patient presented to medical attention in August 2020 with several weeks history of new onset headaches.  Progressive nature of symptoms led to an ED admission, where CNS imaging demonstrated enhancing mass in the left occipital lobe.  He underwent craniotomy and resection with Dr. [name] 82/64/15without complication.  He was discharged to home with small visual field impairment and otherwise functionally intact.  He denies headaches or any seizures  since surgery, having completed decadron taper.    Medications: Current Outpatient Medications on File Prior to Visit  Medication Sig Dispense Refill  . dexamethasone (DECADRON) 4 MG tablet Take 1 tablet (4 mg total) by mouth 2 (two) times daily. 30 tablet 1  . finasteride (PROSCAR) 5 MG tablet Take 5 mg by mouth daily.    .Marland KitchenHYDROcodone-acetaminophen (NORCO/VICODIN) 5-325 MG tablet Take 1-2 tablets by mouth every 6 (six) hours as needed. (Patient not taking: Reported on 01/09/2019) 8 tablet 0  . levETIRAcetam (KEPPRA) 500 MG tablet Take 2 tablets (1,000 mg total) by mouth 2 (two) times daily. 60 tablet 3  . ondansetron (ZOFRAN) 8 MG tablet Take 1 tablet (8 mg total) by mouth 2 (two) times daily as needed (nausea and vomiting). May take 30-60 minutes prior to Temodar administration if nausea/vomiting occurs. 30 tablet 1  . tamsulosin (FLOMAX) 0.4 MG CAPS capsule Take 0.4 mg by mouth daily.    .Marland Kitchentemozolomide (TEMODAR) 100 MG capsule Take 3 capsules (300 mg total) by mouth daily. May take on an empty stomach to decrease nausea & vomiting. 15 capsule 0  . traMADol (ULTRAM) 50 MG tablet TAKE 1 TABLET(50 MG) BY MOUTH EVERY 6 HOURS AS NEEDED FOR SEVERE PAIN 30 tablet 1   No current facility-administered medications on file prior to visit.    Allergies: No Known Allergies Past Medical History:  Past Medical History:  Diagnosis Date  . Enlarged prostate   . Migraines    Past Surgical History:  Past Surgical History:  Procedure Laterality Date  . APPLICATION OF CRANIAL NAVIGATION  Left 09/29/2018   Procedure: APPLICATION OF CRANIAL NAVIGATION;  Surgeon: [name], MD;  Location: [facility];  Service: Neurosurgery;  Laterality: Left;  . CRANIOTOMY Left 09/29/2018   Procedure: Sterotactic Left craniotomy for resection of tumor;  Surgeon: [name], MD;  Location: [facility];  Service: Neurosurgery;  Laterality: Left;  Sterotactic Left craniotomy for resection of tumor   Social History:    Social History   Socioeconomic History  . Marital status: Married    Spouse name: Not on file  . Number of children: Not on file  . Years of education: Not on file  . Highest education level: Not on file  Occupational History  . Not on file  Tobacco Use  . Smoking status: Never Smoker  . Smokeless tobacco: Never Used  Substance and Sexual Activity  . Alcohol use: Never  . Drug use: Never  . Sexual activity: Not on file  Other Topics Concern  . Not on file  Social History Narrative  . Not on file   Social Determinants of Health   Financial Resource Strain:   . Difficulty of Paying Living Expenses: Not on file  Food Insecurity:   . Worried About Charity fundraiser in the Last Year: Not on file  . Ran Out of Food in the Last Year: Not on file  Transportation Needs: Unmet Transportation Needs  . Lack of Transportation (Medical): Yes  . Lack of Transportation (Non-Medical): No  Physical Activity:   . Days of Exercise per Week: Not on file  . Minutes of Exercise per Session: Not on file  Stress:   . Feeling of Stress : Not on file  Social Connections:   . Frequency of Communication with Friends and Family: Not on file  . Frequency of Social Gatherings with Friends and Family: Not on file  . Attends Religious Services: Not on file  . Active Member of Clubs or Organizations: Not on file  . Attends Archivist Meetings: Not on file  . Marital Status: Not on file  Intimate Partner Violence: Not At Risk  . Fear of Current or Ex-Partner: No  . Emotionally Abused: No  . Physically Abused: No  . Sexually Abused: No   Family History: No family history on file.  Review of Systems: Constitutional: Denies fevers, chills or abnormal weight loss Eyes: Denies blurriness of vision Ears, nose, mouth, throat, and face: Denies mucositis or sore throat Respiratory: Denies cough, dyspnea or wheezes Cardiovascular: Denies palpitation, chest discomfort or lower extremity  swelling Gastrointestinal:  Denies nausea, constipation, diarrhea GU: Denies dysuria or incontinence Skin: Denies abnormal skin rashes Neurological: Per HPI Musculoskeletal: Denies joint pain, back or neck discomfort. No decrease in ROM Behavioral/Psych: Denies anxiety, disturbance in thought content, and mood instability  Physical Exam: Vitals:   01/23/19 1212  BP: (!) 149/78  Pulse: (!) 56  Resp: 17  Temp: 98.3 F (36.8 C)  SpO2: 100%   KPS: 80. General: Alert, cooperative, pleasant, in no acute distress Head: Craniotomy scar noted, dry and intact. EENT: No conjunctival injection or scleral icterus. Oral mucosa moist Lungs: Resp effort normal Cardiac: Regular rate and rhythm Abdomen: Soft, non-distended abdomen Skin: No rashes cyanosis or petechiae. Extremities: No clubbing or edema  Neurologic Exam: Mental Status: Awake, alert, attentive to examiner. Oriented to self and environment. Subtle mixed dysphasia motor>sensory. Cranial Nerves: Visual acuity is grossly normal. Right homonymous hemianopia. Extra-ocular movements intact. No ptosis. Face is symmetric, tongue midline. Motor: Tone and bulk are normal.  Power is full in both arms and legs. Reflexes are symmetric, no pathologic reflexes present. Intact finger to nose bilaterally Sensory: Intact to light touch and temperature Gait: Deferred  Labs: I have reviewed the data as listed    Component Value Date/Time   NA 138 12/16/2018 1407   K 4.4 12/16/2018 1407   CL 101 12/16/2018 1407   CO2 30 12/16/2018 1407   GLUCOSE 112 (H) 12/16/2018 1407   BUN 16 12/16/2018 1407   CREATININE 1.29 (H) 12/16/2018 1407   CREATININE 1.54 (H) 12/13/2018 1513   CALCIUM 8.8 (L) 12/16/2018 1407   PROT 6.5 12/13/2018 1513   ALBUMIN 3.8 12/13/2018 1513   AST 12 (L) 12/13/2018 1513   ALT 23 12/13/2018 1513   ALKPHOS 100 12/13/2018 1513   BILITOT 0.3 12/13/2018 1513   GFRNONAA >60 12/16/2018 1407   GFRNONAA 50 (L) 12/13/2018 1513    GFRAA >60 12/16/2018 1407   GFRAA 58 (L) 12/13/2018 1513   Lab Results  Component Value Date   WBC 15.0 (H) 12/16/2018   NEUTROABS 13.7 (H) 12/13/2018   HGB 15.6 12/16/2018   HCT 45.9 12/16/2018   MCV 86.3 12/16/2018   PLT 169 12/16/2018    Assessment/Plan Glioblastoma ([place])  Mr. Jindra unfortunately is clinically and radiographically progressive today, now having completed radiation therapy.    He is cleared today for initiation of Avastin, 42m/kg IV q2 weeks.  Will begin checking urine protein prior to second infusion.  We recommended continuing treatment with Temozolomide 1530mm2, on for five days and off for twenty three days in twenty eight day cycles. The patient will have a complete blood count performed on days 21 and 28 of each cycle, and a comprehensive metabolic panel performed on day 28 of each cycle. Labs may need to be performed more often. Zofran will prescribed for home use for nausea/vomiting.    Avastin should be held for the following:  ANC less than 500  Platelets less than 50,000  LFT or creatinine greater than 2x ULN  If clinical concerns/contraindications develop  Chemotherapy should be held for the following:  ANC less than 1,000  Platelets less than 100,000  LFT or creatinine greater than 2x ULN  If clinical concerns/contraindications develop  We recommended decreasing decadron to 34m87maily x7 days, then 3mg36mily x7 days.  Keppra will con't 1000mg434m.  We appreciate the opportunity to participate in the care of Cody BURNLEYHe will return to clinic in two weeks for second Avastin infusion and planned cycle #2 of Temozolomide.  All questions were answered. The patient knows to call the clinic with any problems, questions or concerns. No barriers to learning were detected.  The total time spent in the encounter was 25 minutes and more than 50% was on counseling and review of test results   ZachaVentura SellersMedical  Director of Neuro-Oncology [facility]leAl[facility]1/20 12:04 PM

## 2019-01-23 NOTE — Progress Notes (Signed)
Per Dr. Mickeal Skinner no need for Urine Protein today 01/23/2019 since first treatment of Avastin.  Will need on future treatments.

## 2019-01-24 ENCOUNTER — Telehealth: Payer: Self-pay | Admitting: Internal Medicine

## 2019-01-24 ENCOUNTER — Telehealth: Payer: Self-pay | Admitting: Hematology

## 2019-01-24 NOTE — Telephone Encounter (Signed)
Scheduled appt per 12/21 los.  Sent treatment for 1/4 as an add-on.  Will contact patient once treatment has been added.

## 2019-01-24 NOTE — Telephone Encounter (Signed)
Scheduled appts per 12/21 los.  Wasn't able to get treatment added for 1/4, per infusion we can do  1/6 or 1/7.  Per MD it was okay to do 1/7.  Spoke with pt and he is aware of his appt date and time.

## 2019-01-24 NOTE — Telephone Encounter (Signed)
Error

## 2019-02-01 ENCOUNTER — Other Ambulatory Visit: Payer: Self-pay | Admitting: Internal Medicine

## 2019-02-01 DIAGNOSIS — C719 Malignant neoplasm of brain, unspecified: Secondary | ICD-10-CM

## 2019-02-02 NOTE — Telephone Encounter (Signed)
Next lab and visit 02/09/2019

## 2019-02-06 ENCOUNTER — Ambulatory Visit: Payer: BC Managed Care – PPO | Admitting: Internal Medicine

## 2019-02-06 ENCOUNTER — Other Ambulatory Visit: Payer: BC Managed Care – PPO

## 2019-02-06 MED FILL — ONDANSETRON HCL 8 MG TABLET: 8 | 21 days supply | Qty: 18 | Fill #1

## 2019-02-09 ENCOUNTER — Other Ambulatory Visit: Payer: Self-pay

## 2019-02-09 ENCOUNTER — Inpatient Hospital Stay: Payer: BC Managed Care – PPO | Attending: Internal Medicine

## 2019-02-09 ENCOUNTER — Inpatient Hospital Stay (HOSPITAL_BASED_OUTPATIENT_CLINIC_OR_DEPARTMENT_OTHER): Payer: BC Managed Care – PPO | Admitting: Internal Medicine

## 2019-02-09 ENCOUNTER — Inpatient Hospital Stay: Payer: BC Managed Care – PPO

## 2019-02-09 VITALS — BP 132/76 | HR 54 | Temp 97.5°F | Resp 18 | Ht 72.0 in | Wt 194.2 lb

## 2019-02-09 DIAGNOSIS — R569 Unspecified convulsions: Secondary | ICD-10-CM | POA: Diagnosis not present

## 2019-02-09 DIAGNOSIS — Z7189 Other specified counseling: Secondary | ICD-10-CM | POA: Diagnosis not present

## 2019-02-09 DIAGNOSIS — C714 Malignant neoplasm of occipital lobe: Secondary | ICD-10-CM | POA: Diagnosis not present

## 2019-02-09 DIAGNOSIS — Z79899 Other long term (current) drug therapy: Secondary | ICD-10-CM | POA: Diagnosis not present

## 2019-02-09 DIAGNOSIS — C719 Malignant neoplasm of brain, unspecified: Secondary | ICD-10-CM

## 2019-02-09 DIAGNOSIS — R112 Nausea with vomiting, unspecified: Secondary | ICD-10-CM | POA: Insufficient documentation

## 2019-02-09 DIAGNOSIS — Z5111 Encounter for antineoplastic chemotherapy: Secondary | ICD-10-CM | POA: Diagnosis not present

## 2019-02-09 DIAGNOSIS — N400 Enlarged prostate without lower urinary tract symptoms: Secondary | ICD-10-CM | POA: Insufficient documentation

## 2019-02-09 LAB — CBC WITH DIFFERENTIAL (CANCER CENTER ONLY)
Abs Immature Granulocytes: 0.71 10*3/uL — ABNORMAL HIGH (ref 0.00–0.07)
Basophils Absolute: 0 10*3/uL (ref 0.0–0.1)
Basophils Relative: 0 %
Eosinophils Absolute: 0 10*3/uL (ref 0.0–0.5)
Eosinophils Relative: 0 %
HCT: 40.9 % (ref 39.0–52.0)
Hemoglobin: 14.4 g/dL (ref 13.0–17.0)
Immature Granulocytes: 6 %
Lymphocytes Relative: 7 %
Lymphs Abs: 0.9 10*3/uL (ref 0.7–4.0)
MCH: 29.9 pg (ref 26.0–34.0)
MCHC: 35.2 g/dL (ref 30.0–36.0)
MCV: 84.9 fL (ref 80.0–100.0)
Monocytes Absolute: 0.7 10*3/uL (ref 0.1–1.0)
Monocytes Relative: 6 %
Neutro Abs: 9.6 10*3/uL — ABNORMAL HIGH (ref 1.7–7.7)
Neutrophils Relative %: 81 %
Platelet Count: 105 10*3/uL — ABNORMAL LOW (ref 150–400)
RBC: 4.82 MIL/uL (ref 4.22–5.81)
RDW: 14.6 % (ref 11.5–15.5)
WBC Count: 12 10*3/uL — ABNORMAL HIGH (ref 4.0–10.5)
nRBC: 0 % (ref 0.0–0.2)

## 2019-02-09 LAB — CMP (CANCER CENTER ONLY)
ALT: 46 U/L — ABNORMAL HIGH (ref 0–44)
AST: 14 U/L — ABNORMAL LOW (ref 15–41)
Albumin: 3.5 g/dL (ref 3.5–5.0)
Alkaline Phosphatase: 116 U/L (ref 38–126)
Anion gap: 9 (ref 5–15)
BUN: 18 mg/dL (ref 6–20)
CO2: 25 mmol/L (ref 22–32)
Calcium: 8.3 mg/dL — ABNORMAL LOW (ref 8.9–10.3)
Chloride: 105 mmol/L (ref 98–111)
Creatinine: 1.09 mg/dL (ref 0.61–1.24)
GFR, Est AFR Am: >60 mL/min (ref >60)
GFR, Estimated: >60 mL/min (ref >60)
Glucose, Bld: 119 mg/dL — ABNORMAL HIGH (ref 70–99)
Potassium: 4.2 mmol/L (ref 3.5–5.1)
Sodium: 139 mmol/L (ref 135–145)
Total Bilirubin: 0.5 mg/dL (ref 0.3–1.2)
Total Protein: 6 g/dL — ABNORMAL LOW (ref 6.5–8.1)

## 2019-02-09 LAB — TOTAL PROTEIN, URINE DIPSTICK: Protein, ur: NEGATIVE mg/dL

## 2019-02-09 MED ORDER — DEXAMETHASONE 1 MG PO TABS: 2.0000 mg | ORAL_TABLET | Freq: Every day | ORAL | 1 refills | Status: DC

## 2019-02-09 MED ORDER — SODIUM CHLORIDE 0.9 % IV SOLN
10.0000 mg/kg | Freq: Once | INTRAVENOUS | Status: AC
  Administered 2019-02-09: 13:00:00 800 mg via INTRAVENOUS
  Filled 2019-02-09: qty 32

## 2019-02-09 MED ORDER — SODIUM CHLORIDE 0.9 % IV SOLN
Freq: Once | INTRAVENOUS | Status: AC
  Filled 2019-02-09: qty 250

## 2019-02-09 MED ORDER — TEMOZOLOMIDE 100 MG PO CAPS: 150.0000 mg/m2/d | ORAL_CAPSULE | Freq: Every day | ORAL | 0 refills | Status: DC

## 2019-02-09 MED FILL — TEMOZOLOMIDE 100 MG CAPS: 100 | 5 days supply | Qty: 15 | Fill #0

## 2019-02-09 NOTE — Patient Instructions (Addendum)
[facility] Discharge Instructions for Patients Receiving Chemotherapy  Today you received the following chemotherapy agents: Bevacizumab.  To help prevent nausea and vomiting after your treatment, we encourage you to take your nausea medication as directed.    If you develop nausea and vomiting that is not controlled by your nausea medication, call the clinic.   BELOW ARE SYMPTOMS THAT SHOULD BE REPORTED IMMEDIATELY:  *FEVER GREATER THAN 100.5 F  *CHILLS WITH OR WITHOUT FEVER  NAUSEA AND VOMITING THAT IS NOT CONTROLLED WITH YOUR NAUSEA MEDICATION  *UNUSUAL SHORTNESS OF BREATH  *UNUSUAL BRUISING OR BLEEDING  TENDERNESS IN MOUTH AND THROAT WITH OR WITHOUT PRESENCE OF ULCERS  *URINARY PROBLEMS  *BOWEL PROBLEMS  UNUSUAL RASH Items with * indicate a potential emergency and should be followed up as soon as possible.  Feel free to call the clinic should you have any questions or concerns. The clinic phone number is [phone].  Please show the [facility] at check-in to the Emergency Department and triage nurse.    Constipation, Adult Constipation is when a person:  Poops (has a bowel movement) fewer times in a week than normal.  Has a hard time pooping.  Has poop that is dry, hard, or bigger than normal. Follow these instructions at home: Eating and drinking   Eat foods that have a lot of fiber, such as: ? Fresh fruits and vegetables. ? Whole grains. ? Beans.  Eat less of foods that are high in fat, low in fiber, or overly processed, such as: ? [place] fries. ? Hamburgers. ? Cookies. ? Candy. ? Soda.  Drink enough fluid to keep your pee (urine) clear or pale yellow. General instructions  Exercise regularly or as told by your doctor.  Go to the restroom when you feel like you need to poop. Do not hold it in.  Take over-the-counter and prescription medicines only as told by your doctor. These include any fiber supplements.  Do pelvic  floor retraining exercises, such as: ? Doing deep breathing while relaxing your lower belly (abdomen). ? Relaxing your pelvic floor while pooping.  Watch your condition for any changes.  Keep all follow-up visits as told by your doctor. This is important. Contact a doctor if:  You have pain that gets worse.  You have a fever.  You have not pooped for 4 days.  You throw up (vomit).  You are not hungry.  You lose weight.  You are bleeding from the anus.  You have thin, pencil-like poop (stool). Get help right away if:  You have a fever, and your symptoms suddenly get worse.  You leak poop or have blood in your poop.  Your belly feels hard or bigger than normal (is bloated).  You have very bad belly pain.  You feel dizzy or you faint. This information is not intended to replace advice given to you by your health care provider. Make sure you discuss any questions you have with your health care provider. Document Revised: 01/01/2017 Document Reviewed: 07/10/2015 Elsevier Patient Education  [place] INFORMATION Here are some examples of types of laxatives. Even though many laxatives are available over-the-counter, it's best to talk to your doctor about laxative use and which kind may be best for you.

## 2019-02-09 NOTE — Progress Notes (Signed)
[facility] [place], [place] [address] [phone]   Interval Evaluation  Date of Service: 02/09/19 Patient Name: Cody [name] Patient MRN: [id number] Patient DOB: July 29, 1960 Provider: Ventura Sellers, MD  Identifying Statement:  Cody [name] is a 59 y.o. male with left occipital glioblastoma   Oncologic History: Oncology History  Glioblastoma ([place])  09/29/2018 Surgery   Craniotomy, resection with Dr. Kathyrn Sheriff   10/21/2018 - 10/31/2018 Chemotherapy   The patient had [No matching medication found in this treatment plan]  for chemotherapy treatment.    01/09/2019 -  Chemotherapy   The patient had [No matching medication found in this treatment plan]  for chemotherapy treatment.    01/23/2019 -  Chemotherapy   The patient had bevacizumab-bvzr (ZIRABEV) 800 mg in sodium chloride 0.9 % 100 mL chemo infusion, 10 mg/kg = 800 mg, Intravenous,  Once, 1 of 6 cycles Administration: 800 mg (01/23/2019)  for chemotherapy treatment.      Biomarkers:  MGMT Unknown.  IDH 1/2 Wild type.  EGFR Unknown  TERT Unknown   Interval History:  Cody [name] presents today for second avastin infusion.  He and his wife describe stability language expression since prior visit.  Vision on the right side is still impaired as prior. He maintains full functional status otherwise.  No issues with gait or motor control. He has had no recurrent seizure events, aside from hand cramping events which are bilateral and concurrent.  H+P (10/18/18) Patient presented to medical attention in August 2020 with several weeks history of new onset headaches.  Progressive nature of symptoms led to an ED admission, where CNS imaging demonstrated enhancing mass in the left occipital lobe.  He underwent craniotomy and resection with Dr. Kathyrn Sheriff on 11/01/55 without complication.  He was discharged to home with small visual field impairment and otherwise  functionally intact.  He denies headaches or any seizures since surgery, having completed decadron taper.    Medications: Current Outpatient Medications on File Prior to Visit  Medication Sig Dispense Refill  . dexamethasone (DECADRON) 4 MG tablet Take 1 tablet (4 mg total) by mouth 2 (two) times daily. 30 tablet 1  . finasteride (PROSCAR) 5 MG tablet Take 5 mg by mouth daily.    [name] HYDROcodone-acetaminophen (NORCO/VICODIN) 5-325 MG tablet Take 1-2 tablets by mouth every 6 (six) hours as needed. (Patient not taking: Reported on 01/09/2019) 8 tablet 0  . levETIRAcetam (KEPPRA) 500 MG tablet Take 2 tablets (1,000 mg total) by mouth 2 (two) times daily. 60 tablet 3  . ondansetron (ZOFRAN) 8 MG tablet Take 1 tablet (8 mg total) by mouth 2 (two) times daily as needed (nausea and vomiting). May take 30-60 minutes prior to Temodar administration if nausea/vomiting occurs. 30 tablet 1  . tamsulosin (FLOMAX) 0.4 MG CAPS capsule Take 0.4 mg by mouth daily.    [name] temozolomide (TEMODAR) 100 MG capsule Take 3 capsules (300 mg total) by mouth daily. May take on an empty stomach to decrease nausea & vomiting. 15 capsule 0  . traMADol (ULTRAM) 50 MG tablet TAKE 1 TABLET(50 MG) BY MOUTH EVERY 6 HOURS AS NEEDED FOR SEVERE PAIN (Patient not taking: Reported on 01/23/2019) 30 tablet 1   No current facility-administered medications on file prior to visit.    Allergies: No Known Allergies Past Medical History:  Past Medical History:  Diagnosis Date  . Enlarged prostate   . Migraines    Past Surgical History:  Past Surgical History:  Procedure Laterality Date  . APPLICATION OF CRANIAL NAVIGATION Left 09/29/2018   Procedure: APPLICATION OF CRANIAL NAVIGATION;  Surgeon: [name], MD;  Location: [facility];  Service: Neurosurgery;  Laterality: Left;  . CRANIOTOMY Left 09/29/2018   Procedure: Sterotactic Left craniotomy for resection of tumor;  Surgeon: [name], MD;  Location: [facility];  Service:  Neurosurgery;  Laterality: Left;  Sterotactic Left craniotomy for resection of tumor   Social History:  Social History   Socioeconomic History  . Marital status: Married    Spouse name: Not on file  . Number of children: Not on file  . Years of education: Not on file  . Highest education level: Not on file  Occupational History  . Not on file  Tobacco Use  . Smoking status: Never Smoker  . Smokeless tobacco: Never Used  Substance and Sexual Activity  . Alcohol use: Never  . Drug use: Never  . Sexual activity: Not on file  Other Topics Concern  . Not on file  Social History Narrative  . Not on file   Social Determinants of Health   Financial Resource Strain:   . Difficulty of Paying Living Expenses: Not on file  Food Insecurity:   . Worried About Charity fundraiser in the Last Year: Not on file  . Ran Out of Food in the Last Year: Not on file  Transportation Needs: Unmet Transportation Needs  . Lack of Transportation (Medical): Yes  . Lack of Transportation (Non-Medical): No  Physical Activity:   . Days of Exercise per Week: Not on file  . Minutes of Exercise per Session: Not on file  Stress:   . Feeling of Stress : Not on file  Social Connections:   . Frequency of Communication with Friends and Family: Not on file  . Frequency of Social Gatherings with Friends and Family: Not on file  . Attends Religious Services: Not on file  . Active Member of Clubs or Organizations: Not on file  . Attends Archivist Meetings: Not on file  . Marital Status: Not on file  Intimate Partner Violence: Not At Risk  . Fear of Current or Ex-Partner: No  . Emotionally Abused: No  . Physically Abused: No  . Sexually Abused: No   Family History: No family history on file.  Review of Systems: Constitutional: Denies fevers, chills or abnormal weight loss Eyes: Denies blurriness of vision Ears, nose, mouth, throat, and face: Denies mucositis or sore throat Respiratory: Denies  cough, dyspnea or wheezes Cardiovascular: Denies palpitation, chest discomfort or lower extremity swelling Gastrointestinal:  Denies nausea, constipation, diarrhea GU: Denies dysuria or incontinence Skin: Denies abnormal skin rashes Neurological: Per HPI Musculoskeletal: Denies joint pain, back or neck discomfort. No decrease in ROM Behavioral/Psych: Denies anxiety, disturbance in thought content, and mood instability  Physical Exam: Vitals:   02/09/19 1105  BP: 132/76  Pulse: (!) 54  Resp: 18  Temp: (!) 97.5 F (36.4 C)  SpO2: 100%   KPS: 80. General: Alert, cooperative, pleasant, in no acute distress Head: Craniotomy scar noted, dry and intact. EENT: No conjunctival injection or scleral icterus. Oral mucosa moist Lungs: Resp effort normal Cardiac: Regular rate and rhythm Abdomen: Soft, non-distended abdomen Skin: No rashes cyanosis or petechiae. Extremities: No clubbing or edema  Neurologic Exam: Mental Status: Awake, alert, attentive to examiner. Oriented to self and environment. Subtle mixed dysphasia motor>sensory. Cranial Nerves: Visual acuity is grossly normal. Right homonymous hemianopia. Extra-ocular movements intact. No  ptosis. Face is symmetric, tongue midline. Motor: Tone and bulk are normal. Power is full in both arms and legs. Reflexes are symmetric, no pathologic reflexes present. Intact finger to nose bilaterally Sensory: Intact to light touch and temperature Gait: Deferred  Labs: I have reviewed the data as listed    Component Value Date/Time   NA 138 01/23/2019 1154   K 4.4 01/23/2019 1154   CL 102 01/23/2019 1154   CO2 25 01/23/2019 1154   GLUCOSE 169 (H) 01/23/2019 1154   BUN 20 01/23/2019 1154   CREATININE 1.12 01/23/2019 1154   CALCIUM 8.4 (L) 01/23/2019 1154   PROT 6.0 (L) 01/23/2019 1154   ALBUMIN 3.5 01/23/2019 1154   AST 17 01/23/2019 1154   ALT 60 (H) 01/23/2019 1154   ALKPHOS 93 01/23/2019 1154   BILITOT 0.5 01/23/2019 1154   GFRNONAA  >60 01/23/2019 1154   GFRAA >60 01/23/2019 1154   Lab Results  Component Value Date   WBC 12.0 (H) 02/09/2019   NEUTROABS PENDING 02/09/2019   HGB 14.4 02/09/2019   HCT 40.9 02/09/2019   MCV 84.9 02/09/2019   PLT 105 (L) 02/09/2019    Assessment/Plan Glioblastoma (HCC)  Focal seizures (HCC)  Goals of care, counseling/discussion  Mr. Tamura is clinically stable today.  Labs are WNL.    He is cleared today for cycle #2 of Avastin, 17m/kg IV q2 weeks.    We recommended continuing treatment with cycle #2 Temozolomide 1555mm2, on for five days and off for twenty three days in twenty eight day cycles. The patient will have a complete blood count performed on days 21 and 28 of each cycle, and a comprehensive metabolic panel performed on day 28 of each cycle. Labs may need to be performed more often. Zofran will prescribed for home use for nausea/vomiting.    Avastin should be held for the following:  ANC less than 500  Platelets less than 50,000  LFT or creatinine greater than 2x ULN  If clinical concerns/contraindications develop  Chemotherapy should be held for the following:  ANC less than 1,000  Platelets less than 100,000  LFT or creatinine greater than 2x ULN  If clinical concerns/contraindications develop  Temodar cycle should initiate on 02/20/19.  Next MRI will be scheduled for 03/17/19.  We recommended decreasing decadron to 101m83maily x7 days, then 1mg73mily x7 days.  Keppra will con't 1000mg26m.  We appreciate the opportunity to participate in the care of Cody HULSEHe will return to clinic in two weeks for third Avastin infusion.  All questions were answered. The patient knows to call the clinic with any problems, questions or concerns. No barriers to learning were detected.  The total time spent in the encounter was 30 minutes and more than 50% was on counseling and review of test results   ZachaVentura SellersMedical Director of  Neuro-Oncology [facility]haven7/21 10:54 AM

## 2019-02-10 ENCOUNTER — Telehealth: Payer: Self-pay | Admitting: Internal Medicine

## 2019-02-10 NOTE — Telephone Encounter (Signed)
Scheduled appt per 1/7 los.  Spoke with pt and he is aware of his appt date and time.

## 2019-02-13 ENCOUNTER — Other Ambulatory Visit: Payer: Self-pay | Admitting: Internal Medicine

## 2019-02-13 DIAGNOSIS — C719 Malignant neoplasm of brain, unspecified: Secondary | ICD-10-CM

## 2019-02-17 ENCOUNTER — Other Ambulatory Visit: Payer: Self-pay | Admitting: Internal Medicine

## 2019-02-17 NOTE — Telephone Encounter (Signed)
Please see refill request.

## 2019-02-24 ENCOUNTER — Other Ambulatory Visit: Payer: Self-pay

## 2019-02-24 ENCOUNTER — Inpatient Hospital Stay: Payer: BC Managed Care – PPO | Admitting: Internal Medicine

## 2019-02-24 ENCOUNTER — Inpatient Hospital Stay: Payer: BC Managed Care – PPO

## 2019-02-24 VITALS — BP 141/85 | HR 60 | Temp 98.9°F | Resp 18 | Ht 72.0 in | Wt 197.3 lb

## 2019-02-24 VITALS — BP 132/79

## 2019-02-24 DIAGNOSIS — C719 Malignant neoplasm of brain, unspecified: Secondary | ICD-10-CM

## 2019-02-24 DIAGNOSIS — C714 Malignant neoplasm of occipital lobe: Secondary | ICD-10-CM | POA: Diagnosis not present

## 2019-02-24 LAB — CBC WITH DIFFERENTIAL (CANCER CENTER ONLY)
Abs Immature Granulocytes: 0.3 10*3/uL — ABNORMAL HIGH (ref 0.00–0.07)
Basophils Absolute: 0.1 10*3/uL (ref 0.0–0.1)
Basophils Relative: 1 %
Eosinophils Absolute: 0.1 10*3/uL (ref 0.0–0.5)
Eosinophils Relative: 1 %
HCT: 39.8 % (ref 39.0–52.0)
Hemoglobin: 14.3 g/dL (ref 13.0–17.0)
Immature Granulocytes: 3 %
Lymphocytes Relative: 11 %
Lymphs Abs: 1.3 10*3/uL (ref 0.7–4.0)
MCH: 30.1 pg (ref 26.0–34.0)
MCHC: 35.9 g/dL (ref 30.0–36.0)
MCV: 83.8 fL (ref 80.0–100.0)
Monocytes Absolute: 0.8 10*3/uL (ref 0.1–1.0)
Monocytes Relative: 6 %
Neutro Abs: 9.5 10*3/uL — ABNORMAL HIGH (ref 1.7–7.7)
Neutrophils Relative %: 78 %
Platelet Count: 112 10*3/uL — ABNORMAL LOW (ref 150–400)
RBC: 4.75 MIL/uL (ref 4.22–5.81)
RDW: 14.3 % (ref 11.5–15.5)
WBC Count: 11.9 10*3/uL — ABNORMAL HIGH (ref 4.0–10.5)
nRBC: 0 % (ref 0.0–0.2)

## 2019-02-24 LAB — CMP (CANCER CENTER ONLY)
ALT: 40 U/L (ref 0–44)
AST: 15 U/L (ref 15–41)
Albumin: 3.6 g/dL (ref 3.5–5.0)
Alkaline Phosphatase: 103 U/L (ref 38–126)
Anion gap: 12 (ref 5–15)
BUN: 17 mg/dL (ref 6–20)
CO2: 24 mmol/L (ref 22–32)
Calcium: 8.6 mg/dL — ABNORMAL LOW (ref 8.9–10.3)
Chloride: 106 mmol/L (ref 98–111)
Creatinine: 1.08 mg/dL (ref 0.61–1.24)
GFR, Est AFR Am: >60 mL/min (ref >60)
GFR, Estimated: >60 mL/min (ref >60)
Glucose, Bld: 115 mg/dL — ABNORMAL HIGH (ref 70–99)
Potassium: 3.8 mmol/L (ref 3.5–5.1)
Sodium: 142 mmol/L (ref 135–145)
Total Bilirubin: 0.5 mg/dL (ref 0.3–1.2)
Total Protein: 6.5 g/dL (ref 6.5–8.1)

## 2019-02-24 LAB — TOTAL PROTEIN, URINE DIPSTICK: Protein, ur: NEGATIVE mg/dL

## 2019-02-24 MED ORDER — SODIUM CHLORIDE 0.9 % IV SOLN
10.0000 mg/kg | Freq: Once | INTRAVENOUS | Status: AC
  Administered 2019-02-24: 900 mg via INTRAVENOUS
  Filled 2019-02-24: qty 32

## 2019-02-24 MED ORDER — SODIUM CHLORIDE 0.9 % IV SOLN
Freq: Once | INTRAVENOUS | Status: AC
  Filled 2019-02-24: qty 250

## 2019-02-24 NOTE — Progress Notes (Signed)
I s/w Dr. Mickeal Skinner by phone - given ok to increase Bevacizumab dose to account for weight increase; today dose = 900 mg.  Kennith Center, Pharm.D., CPP 02/24/2019@10 :41 AM

## 2019-02-24 NOTE — Progress Notes (Signed)
[facility] [place], [place] [address] [phone]   Interval Evaluation  Date of Service: 02/24/19 Patient Name: Cody [name] Patient MRN: [id number] Patient DOB: [birth year] Provider: Ventura Sellers, MD  Identifying Statement:  Cody [name] is a 59 y.o. male with left occipital glioblastoma   Oncologic History: Oncology History  Glioblastoma ([place])  09/29/2018 Surgery   Craniotomy, resection with Dr. Kathyrn Sheriff   10/21/2018 - 10/31/2018 Chemotherapy   The patient had [No matching medication found in this treatment plan]  for chemotherapy treatment.    01/09/2019 -  Chemotherapy   The patient had [No matching medication found in this treatment plan]  for chemotherapy treatment.    01/23/2019 -  Chemotherapy   The patient had bevacizumab-bvzr (ZIRABEV) 800 mg in sodium chloride 0.9 % 100 mL chemo infusion, 10 mg/kg = 800 mg, Intravenous,  Once, 2 of 6 cycles Administration: 800 mg (01/23/2019), 800 mg (02/09/2019)  for chemotherapy treatment.      Biomarkers:  MGMT Unknown.  IDH 1/2 Wild type.  EGFR Unknown  TERT Unknown   Interval History:  Cody [name] presents today for avastin infusion.  He and his wife describe no new or progressive neurologic defcits.  Vision on the right side is still impaired as prior. He maintains full functional status otherwise.  No issues with gait or motor control. He has had no recurrent seizure events.  Currently taking 44m decadron daily.  H+P (10/18/18) Patient presented to medical attention in August 2020 with several weeks history of new onset headaches.  Progressive nature of symptoms led to an ED admission, where CNS imaging demonstrated enhancing mass in the left occipital lobe.  He underwent craniotomy and resection with Dr. [name] 83/41/93without complication.  He was discharged to home with small visual field impairment and otherwise functionally intact.  He  denies headaches or any seizures since surgery, having completed decadron taper.    Medications: Current Outpatient Medications on File Prior to Visit  Medication Sig Dispense Refill  . dexamethasone (DECADRON) 1 MG tablet Take 2 tablets (2 mg total) by mouth daily. 60 tablet 1  . finasteride (PROSCAR) 5 MG tablet Take 5 mg by mouth daily.    .Marland KitchenlevETIRAcetam (KEPPRA) 500 MG tablet TAKE 2 TABLETS(1000 MG) BY MOUTH TWICE DAILY 60 tablet 3  . ondansetron (ZOFRAN) 8 MG tablet Take 1 tablet (8 mg total) by mouth 2 (two) times daily as needed (nausea and vomiting). May take 30-60 minutes prior to Temodar administration if nausea/vomiting occurs. (Patient not taking: Reported on 02/09/2019) 30 tablet 1  . tamsulosin (FLOMAX) 0.4 MG CAPS capsule Take 0.4 mg by mouth daily.    .Marland Kitchentemozolomide (TEMODAR) 100 MG capsule Take 3 capsules (300 mg total) by mouth daily. May take on an empty stomach to decrease nausea & vomiting. (Patient not taking: Reported on 02/09/2019) 15 capsule 0  . temozolomide (TEMODAR) 100 MG capsule Take 3 capsules (300 mg total) by mouth daily. May take on an empty stomach to decrease nausea & vomiting. 15 capsule 0  . traMADol (ULTRAM) 50 MG tablet TAKE 1 TABLET(50 MG) BY MOUTH EVERY 6 HOURS AS NEEDED FOR SEVERE PAIN (Patient not taking: Reported on 01/23/2019) 30 tablet 1   No current facility-administered medications on file prior to visit.    Allergies: No Known Allergies Past Medical History:  Past Medical History:  Diagnosis Date  . Enlarged prostate   . Migraines  Past Surgical History:  Past Surgical History:  Procedure Laterality Date  . APPLICATION OF CRANIAL NAVIGATION Left 09/29/2018   Procedure: APPLICATION OF CRANIAL NAVIGATION;  Surgeon: Nundkumar, Neelesh, MD;  Location: [facility];  Service: Neurosurgery;  Laterality: Left;  . CRANIOTOMY Left 09/29/2018   Procedure: Sterotactic Left craniotomy for resection of tumor;  Surgeon: Nundkumar, Neelesh, MD;  Location: [facility];   Service: Neurosurgery;  Laterality: Left;  Sterotactic Left craniotomy for resection of tumor   Social History:  Social History   Socioeconomic History  . Marital status: Married    Spouse name: Not on file  . Number of children: Not on file  . Years of education: Not on file  . Highest education level: Not on file  Occupational History  . Not on file  Tobacco Use  . Smoking status: Never Smoker  . Smokeless tobacco: Never Used  Substance and Sexual Activity  . Alcohol use: Never  . Drug use: Never  . Sexual activity: Not on file  Other Topics Concern  . Not on file  Social History Narrative  . Not on file   Social Determinants of Health   Financial Resource Strain:   . Difficulty of Paying Living Expenses: Not on file  Food Insecurity:   . Worried About Running Out of Food in the Last Year: Not on file  . Ran Out of Food in the Last Year: Not on file  Transportation Needs: Unmet Transportation Needs  . Lack of Transportation (Medical): Yes  . Lack of Transportation (Non-Medical): No  Physical Activity:   . Days of Exercise per Week: Not on file  . Minutes of Exercise per Session: Not on file  Stress:   . Feeling of Stress : Not on file  Social Connections:   . Frequency of Communication with Friends and Family: Not on file  . Frequency of Social Gatherings with Friends and Family: Not on file  . Attends Religious Services: Not on file  . Active Member of Clubs or Organizations: Not on file  . Attends Club or Organization Meetings: Not on file  . Marital Status: Not on file  Intimate Partner Violence: Not At Risk  . Fear of Current or Ex-Partner: No  . Emotionally Abused: No  . Physically Abused: No  . Sexually Abused: No   Family History: No family history on file.  Review of Systems: Constitutional: Denies fevers, chills or abnormal weight loss Eyes: Denies blurriness of vision Ears, nose, mouth, throat, and face: Denies mucositis or sore  throat Respiratory: Denies cough, dyspnea or wheezes Cardiovascular: Denies palpitation, chest discomfort or lower extremity swelling Gastrointestinal:  Denies nausea, constipation, diarrhea GU: Denies dysuria or incontinence Skin: Denies abnormal skin rashes Neurological: Per HPI Musculoskeletal: Denies joint pain, back or neck discomfort. No decrease in ROM Behavioral/Psych: Denies anxiety, disturbance in thought content, and mood instability  Physical Exam: Vitals:   02/24/19 0944  BP: (!) 141/85  Pulse: 60  Resp: 18  Temp: 98.9 F (37.2 C)  SpO2: 100%   KPS: 80. General: Alert, cooperative, pleasant, in no acute distress Head: Craniotomy scar noted, dry and intact. EENT: No conjunctival injection or scleral icterus. Oral mucosa moist Lungs: Resp effort normal Cardiac: Regular rate and rhythm Abdomen: Soft, non-distended abdomen Skin: No rashes cyanosis or petechiae. Extremities: No clubbing or edema  Neurologic Exam: Mental Status: Awake, alert, attentive to examiner. Oriented to self and environment. Subtle mixed dysphasia motor>sensory. Cranial Nerves: Visual acuity is grossly normal. Right homonymous hemianopia. Extra-ocular   movements intact. No ptosis. Face is symmetric, tongue midline. Motor: Tone and bulk are normal. Power is full in both arms and legs. Reflexes are symmetric, no pathologic reflexes present. Intact finger to nose bilaterally Sensory: Intact to light touch and temperature Gait: Deferred  Labs: I have reviewed the data as listed    Component Value Date/Time   NA 139 02/09/2019 1036   K 4.2 02/09/2019 1036   CL 105 02/09/2019 1036   CO2 25 02/09/2019 1036   GLUCOSE 119 (H) 02/09/2019 1036   BUN 18 02/09/2019 1036   CREATININE 1.09 02/09/2019 1036   CALCIUM 8.3 (L) 02/09/2019 1036   PROT 6.0 (L) 02/09/2019 1036   ALBUMIN 3.5 02/09/2019 1036   AST 14 (L) 02/09/2019 1036   ALT 46 (H) 02/09/2019 1036   ALKPHOS 116 02/09/2019 1036   BILITOT 0.5  02/09/2019 1036   GFRNONAA >60 02/09/2019 1036   GFRAA >60 02/09/2019 1036   Lab Results  Component Value Date   WBC 12.0 (H) 02/09/2019   NEUTROABS 9.6 (H) 02/09/2019   HGB 14.4 02/09/2019   HCT 40.9 02/09/2019   MCV 84.9 02/09/2019   PLT 105 (L) 02/09/2019    Assessment/Plan Glioblastoma ([place])  Cody [name] is clinically stable today.  Labs are WNL.    He is cleared today for cycle #3 of Avastin, 86m/kg IV q2 weeks.    We recommended continuing treatment with cycle #2 Temozolomide 1558mm2, on for five days and off for twenty three days in twenty eight day cycles. The patient will have a complete blood count performed on days 21 and 28 of each cycle, and a comprehensive metabolic panel performed on day 28 of each cycle. Labs may need to be performed more often. Zofran will prescribed for home use for nausea/vomiting.    Currently on day 5/28 of Temodar cycle #2.  Avastin should be held for the following:  ANC less than 500  Platelets less than 50,000  LFT or creatinine greater than 2x ULN  If clinical concerns/contraindications develop  Chemotherapy should be held for the following:  ANC less than 1,000  Platelets less than 100,000  LFT or creatinine greater than 2x ULN  If clinical concerns/contraindications develop  Temodar cycle should initiate on 02/20/19.  Next MRI scheduled for 03/17/19.  We recommended decreasing decadron to 89m28maily  We appreciate the opportunity to participate in the care of MicTAMERON [name]  He will return to clinic in two weeks for next Avastin infusion.  All questions were answered. The patient knows to call the clinic with any problems, questions or concerns. No barriers to learning were detected.  The total time spent in the encounter was 25 minutes and more than 50% was on counseling and review of test results   ZacVentura SellersD Medical Director of Neuro-Oncology [facility] WesBratenahl/22/21  9:27 AM

## 2019-02-24 NOTE — Patient Instructions (Signed)
[facility] Discharge Instructions for Patients Receiving Chemotherapy  Today you received the following chemotherapy agents Bevacizumab-bvzr  To help prevent nausea and vomiting after your treatment, we encourage you to take your nausea medication as directed   If you develop nausea and vomiting that is not controlled by your nausea medication, call the clinic.   BELOW ARE SYMPTOMS THAT SHOULD BE REPORTED IMMEDIATELY:  *FEVER GREATER THAN 100.5 F  *CHILLS WITH OR WITHOUT FEVER  NAUSEA AND VOMITING THAT IS NOT CONTROLLED WITH YOUR NAUSEA MEDICATION  *UNUSUAL SHORTNESS OF BREATH  *UNUSUAL BRUISING OR BLEEDING  TENDERNESS IN MOUTH AND THROAT WITH OR WITHOUT PRESENCE OF ULCERS  *URINARY PROBLEMS  *BOWEL PROBLEMS  UNUSUAL RASH Items with * indicate a potential emergency and should be followed up as soon as possible.  Feel free to call the clinic should you have any questions or concerns. The clinic phone number is [phone].  Please show the CHEMO ALERT CARD at check-in to the Emergency Department and triage nurse.   

## 2019-02-27 ENCOUNTER — Telehealth: Payer: Self-pay | Admitting: Internal Medicine

## 2019-02-27 NOTE — Telephone Encounter (Signed)
Scheduled appt per 12/2 los. ° °Spoke with pt and he is aware of his appt date and time. °

## 2019-03-01 ENCOUNTER — Other Ambulatory Visit: Payer: Self-pay | Admitting: Radiation Therapy

## 2019-03-10 ENCOUNTER — Telehealth: Payer: Self-pay | Admitting: Internal Medicine

## 2019-03-10 ENCOUNTER — Inpatient Hospital Stay: Payer: BC Managed Care – PPO

## 2019-03-10 ENCOUNTER — Inpatient Hospital Stay: Payer: BC Managed Care – PPO | Admitting: Internal Medicine

## 2019-03-10 ENCOUNTER — Inpatient Hospital Stay: Payer: BC Managed Care – PPO | Attending: Internal Medicine

## 2019-03-10 ENCOUNTER — Other Ambulatory Visit: Payer: Self-pay

## 2019-03-10 VITALS — BP 142/93 | HR 62 | Temp 98.5°F | Resp 17 | Ht 72.0 in | Wt 205.6 lb

## 2019-03-10 DIAGNOSIS — N400 Enlarged prostate without lower urinary tract symptoms: Secondary | ICD-10-CM | POA: Insufficient documentation

## 2019-03-10 DIAGNOSIS — C714 Malignant neoplasm of occipital lobe: Secondary | ICD-10-CM | POA: Diagnosis present

## 2019-03-10 DIAGNOSIS — C719 Malignant neoplasm of brain, unspecified: Secondary | ICD-10-CM

## 2019-03-10 DIAGNOSIS — Z79899 Other long term (current) drug therapy: Secondary | ICD-10-CM | POA: Diagnosis not present

## 2019-03-10 DIAGNOSIS — R112 Nausea with vomiting, unspecified: Secondary | ICD-10-CM | POA: Diagnosis not present

## 2019-03-10 DIAGNOSIS — Z5112 Encounter for antineoplastic immunotherapy: Secondary | ICD-10-CM | POA: Diagnosis not present

## 2019-03-10 DIAGNOSIS — Z9221 Personal history of antineoplastic chemotherapy: Secondary | ICD-10-CM | POA: Insufficient documentation

## 2019-03-10 LAB — CBC WITH DIFFERENTIAL (CANCER CENTER ONLY)
Abs Immature Granulocytes: 0.17 10*3/uL — ABNORMAL HIGH (ref 0.00–0.07)
Basophils Absolute: 0 10*3/uL (ref 0.0–0.1)
Basophils Relative: 1 %
Eosinophils Absolute: 0.1 10*3/uL (ref 0.0–0.5)
Eosinophils Relative: 1 %
HCT: 37 % — ABNORMAL LOW (ref 39.0–52.0)
Hemoglobin: 12.8 g/dL — ABNORMAL LOW (ref 13.0–17.0)
Immature Granulocytes: 2 %
Lymphocytes Relative: 15 %
Lymphs Abs: 1.3 10*3/uL (ref 0.7–4.0)
MCH: 30.4 pg (ref 26.0–34.0)
MCHC: 34.6 g/dL (ref 30.0–36.0)
MCV: 87.9 fL (ref 80.0–100.0)
Monocytes Absolute: 0.7 10*3/uL (ref 0.1–1.0)
Monocytes Relative: 7 %
Neutro Abs: 6.6 10*3/uL (ref 1.7–7.7)
Neutrophils Relative %: 74 %
Platelet Count: 132 10*3/uL — ABNORMAL LOW (ref 150–400)
RBC: 4.21 MIL/uL — ABNORMAL LOW (ref 4.22–5.81)
RDW: 15.3 % (ref 11.5–15.5)
WBC Count: 8.8 10*3/uL (ref 4.0–10.5)
nRBC: 0 % (ref 0.0–0.2)

## 2019-03-10 LAB — CMP (CANCER CENTER ONLY)
ALT: 42 U/L (ref 0–44)
AST: 17 U/L (ref 15–41)
Albumin: 3.4 g/dL — ABNORMAL LOW (ref 3.5–5.0)
Alkaline Phosphatase: 100 U/L (ref 38–126)
Anion gap: 9 (ref 5–15)
BUN: 17 mg/dL (ref 6–20)
CO2: 25 mmol/L (ref 22–32)
Calcium: 8.5 mg/dL — ABNORMAL LOW (ref 8.9–10.3)
Chloride: 108 mmol/L (ref 98–111)
Creatinine: 1.15 mg/dL (ref 0.61–1.24)
GFR, Est AFR Am: >60 mL/min (ref >60)
GFR, Estimated: >60 mL/min (ref >60)
Glucose, Bld: 88 mg/dL (ref 70–99)
Potassium: 3.9 mmol/L (ref 3.5–5.1)
Sodium: 142 mmol/L (ref 135–145)
Total Bilirubin: 0.5 mg/dL (ref 0.3–1.2)
Total Protein: 6.1 g/dL — ABNORMAL LOW (ref 6.5–8.1)

## 2019-03-10 LAB — TOTAL PROTEIN, URINE DIPSTICK: Protein, ur: NEGATIVE mg/dL

## 2019-03-10 MED ORDER — SODIUM CHLORIDE 0.9 % IV SOLN
10.0000 mg/kg | Freq: Once | INTRAVENOUS | Status: AC
  Administered 2019-03-10: 10:00:00 900 mg via INTRAVENOUS
  Filled 2019-03-10: qty 32

## 2019-03-10 MED ORDER — SODIUM CHLORIDE 0.9 % IV SOLN
Freq: Once | INTRAVENOUS | Status: AC
  Filled 2019-03-10: qty 250

## 2019-03-10 NOTE — Telephone Encounter (Signed)
Scheduled appt per 2/5 los.  Spoke with pt and he is aware of the appt date and time.

## 2019-03-10 NOTE — Patient Instructions (Signed)
[facility] Discharge Instructions for Patients Receiving Chemotherapy  Today you received the following chemotherapy agents Bevacizumab  To help prevent nausea and vomiting after your treatment, we encourage you to take your nausea medication as directed   If you develop nausea and vomiting that is not controlled by your nausea medication, call the clinic.   BELOW ARE SYMPTOMS THAT SHOULD BE REPORTED IMMEDIATELY:  *FEVER GREATER THAN 100.5 F  *CHILLS WITH OR WITHOUT FEVER  NAUSEA AND VOMITING THAT IS NOT CONTROLLED WITH YOUR NAUSEA MEDICATION  *UNUSUAL SHORTNESS OF BREATH  *UNUSUAL BRUISING OR BLEEDING  TENDERNESS IN MOUTH AND THROAT WITH OR WITHOUT PRESENCE OF ULCERS  *URINARY PROBLEMS  *BOWEL PROBLEMS  UNUSUAL RASH Items with * indicate a potential emergency and should be followed up as soon as possible.  Feel free to call the clinic should you have any questions or concerns. The clinic phone number is [phone].  Please show the CHEMO ALERT CARD at check-in to the Emergency Department and triage nurse.   

## 2019-03-10 NOTE — Progress Notes (Signed)
Cody [name] at [facility] [place], [place] [address] [phone]   Interval Evaluation  Date of Service: 03/10/19 Patient Name: Cody [name] Patient MRN: [id number] Patient DOB: 1960-05-06 Provider: Ventura Sellers, MD  Identifying Statement:  Cody [name] is a 59 y.o. male with left occipital glioblastoma   Oncologic History: Oncology History  Glioblastoma ([place])  09/29/2018 Surgery   Craniotomy, resection with Dr. Kathyrn [name]   10/21/2018 - 10/31/2018 Chemotherapy   The patient had [No matching medication found in this treatment plan]  for chemotherapy treatment.    01/09/2019 -  Chemotherapy   The patient had [No matching medication found in this treatment plan]  for chemotherapy treatment.    01/23/2019 -  Chemotherapy   The patient had bevacizumab-bvzr (ZIRABEV) 800 mg in sodium chloride 0.9 % 100 mL chemo infusion, 10 mg/kg = 800 mg, Intravenous,  Once, 3 of 6 cycles Administration: 800 mg (01/23/2019), 800 mg (02/09/2019), 900 mg (02/24/2019)  for chemotherapy treatment.      Biomarkers:  MGMT Unknown.  IDH 1/2 Wild type.  EGFR Unknown  TERT Unknown   Interval History:  Cody [name] presents today for avastin infusion.  Cody [name] and his wife describe no new or progressive neurologic defcits.  Vision on the right side is still impaired as prior. Cody [name] maintains full functional status otherwise.  No issues with gait or motor control. Cody [name] has had no recurrent seizure events.  Currently taking 67m decadron daily.  H+P (10/18/18) Patient presented to medical attention in August 2020 with several weeks history of new onset headaches.  Progressive nature of symptoms led to an ED admission, where CNS imaging demonstrated enhancing mass in the left occipital lobe.  Cody [name] underwent craniotomy and resection with Dr. NKathyrn [name] 87/78/24without complication.  Cody [name] was discharged to home with small visual field impairment and otherwise  functionally intact.  Cody [name] denies headaches or any seizures since surgery, having completed decadron taper.    Medications: Current Outpatient Medications on File Prior to Visit  Medication Sig Dispense Refill  . dexamethasone (DECADRON) 1 MG tablet Take 2 tablets (2 mg total) by mouth daily. 60 tablet 1  . finasteride (PROSCAR) 5 MG tablet Take 5 mg by mouth daily.    .Marland KitchenlevETIRAcetam (KEPPRA) 500 MG tablet TAKE 2 TABLETS(1000 MG) BY MOUTH TWICE DAILY 60 tablet 3  . ondansetron (ZOFRAN) 8 MG tablet Take 1 tablet (8 mg total) by mouth 2 (two) times daily as needed (nausea and vomiting). May take 30-60 minutes prior to Temodar administration if nausea/vomiting occurs. (Patient not taking: Reported on 02/09/2019) 30 tablet 1  . tamsulosin (FLOMAX) 0.4 MG CAPS capsule Take 0.4 mg by mouth daily.    .Marland Kitchentemozolomide (TEMODAR) 100 MG capsule Take 3 capsules (300 mg total) by mouth daily. May take on an empty stomach to decrease nausea & vomiting. (Patient not taking: Reported on 02/09/2019) 15 capsule 0  . temozolomide (TEMODAR) 100 MG capsule Take 3 capsules (300 mg total) by mouth daily. May take on an empty stomach to decrease nausea & vomiting. 15 capsule 0  . traMADol (ULTRAM) 50 MG tablet TAKE 1 TABLET(50 MG) BY MOUTH EVERY 6 HOURS AS NEEDED FOR SEVERE PAIN (Patient not taking: Reported on 01/23/2019) 30 tablet 1   No current facility-administered medications on file prior to visit.    Allergies: No Known Allergies Past Medical History:  Past Medical History:  Diagnosis Date  . Enlarged prostate   .  Migraines    Past Surgical History:  Past Surgical History:  Procedure Laterality Date  . APPLICATION OF CRANIAL NAVIGATION Left 09/29/2018   Procedure: APPLICATION OF CRANIAL NAVIGATION;  Surgeon: [name], MD;  Location: [place] [facility];  Service: Neurosurgery;  Laterality: Left;  . CRANIOTOMY Left 09/29/2018   Procedure: Sterotactic Left craniotomy for resection of tumor;  Surgeon: [name], MD;  Location: [place];  Service: Neurosurgery;  Laterality: Left;  Sterotactic Left craniotomy for resection of tumor   Social History:  Social History   Socioeconomic History  . Marital status: Married    Spouse name: Not on file  . Number of children: Not on file  . Years of education: Not on file  . Highest education level: Not on file  Occupational History  . Not on file  Tobacco Use  . Smoking status: Never Smoker  . Smokeless tobacco: Never Used  Substance and Sexual Activity  . Alcohol use: Never  . Drug use: Never  . Sexual activity: Not on file  Other Topics Concern  . Not on file  Social History Narrative  . Not on file   Social Determinants of Health   Financial Resource Strain:   . Difficulty of Paying Living Expenses: Not on file  Food Insecurity:   . Worried About Charity fundraiser in the Last Year: Not on file  . Ran Out of Food in the Last Year: Not on file  Transportation Needs: Unmet Transportation Needs  . Lack of Transportation (Medical): Yes  . Lack of Transportation (Non-Medical): No  Physical Activity:   . Days of Exercise per Week: Not on file  . Minutes of Exercise per Session: Not on file  Stress:   . Feeling of Stress : Not on file  Social Connections:   . Frequency of Communication with Friends and Family: Not on file  . Frequency of Social Gatherings with Friends and Family: Not on file  . Attends Religious Services: Not on file  . Active Member of Clubs or Organizations: Not on file  . Attends Archivist Meetings: Not on file  . Marital Status: Not on file  Intimate Partner Violence: Not At Risk  . Fear of Current or Ex-Partner: No  . Emotionally Abused: No  . Physically Abused: No  . Sexually Abused: No   Family History: No family history on file.  Review of Systems: Constitutional: Denies fevers, chills or abnormal weight loss Eyes: Denies blurriness of vision Ears, nose, mouth, throat, and face: Denies  mucositis or sore throat Respiratory: Denies cough, dyspnea or wheezes Cardiovascular: Denies palpitation, chest discomfort or lower extremity swelling Gastrointestinal:  Denies nausea, constipation, diarrhea GU: Denies dysuria or incontinence Skin: Denies abnormal skin rashes Neurological: Per HPI Musculoskeletal: Denies joint pain, back or neck discomfort. No decrease in ROM Behavioral/Psych: Denies anxiety, disturbance in thought content, and mood instability  Physical Exam: Vitals:   03/10/19 0922  BP: (!) 142/93  Pulse: 62  Resp: 17  Temp: 98.5 F (36.9 C)  SpO2: 100%   KPS: 80. General: Alert, cooperative, pleasant, in no acute distress Head: Craniotomy scar noted, dry and intact. EENT: No conjunctival injection or scleral icterus. Oral mucosa moist Lungs: Resp effort normal Cardiac: Regular rate and rhythm Abdomen: Soft, non-distended abdomen Skin: No rashes cyanosis or petechiae. Extremities: No clubbing or edema  Neurologic Exam: Mental Status: Awake, alert, attentive to examiner. Oriented to self and environment. Subtle mixed dysphasia motor>sensory. Cranial Nerves: Visual acuity is grossly normal.  Right homonymous hemianopia. Extra-ocular movements intact. No ptosis. Face is symmetric, tongue midline. Motor: Tone and bulk are normal. Power is full in both arms and legs. Reflexes are symmetric, no pathologic reflexes present. Intact finger to nose bilaterally Sensory: Intact to light touch and temperature Gait: Deferred  Labs: I have reviewed the data as listed    Component Value Date/Time   NA 142 03/10/2019 0906   K 3.9 03/10/2019 0906   CL 108 03/10/2019 0906   CO2 25 03/10/2019 0906   GLUCOSE 88 03/10/2019 0906   BUN 17 03/10/2019 0906   CREATININE 1.15 03/10/2019 0906   CALCIUM 8.5 (L) 03/10/2019 0906   PROT 6.1 (L) 03/10/2019 0906   ALBUMIN 3.4 (L) 03/10/2019 0906   AST 17 03/10/2019 0906   ALT 42 03/10/2019 0906   ALKPHOS 100 03/10/2019 0906    BILITOT 0.5 03/10/2019 0906   GFRNONAA >60 03/10/2019 0906   GFRAA >60 03/10/2019 0906   Lab Results  Component Value Date   WBC 8.8 03/10/2019   NEUTROABS 6.6 03/10/2019   HGB 12.8 (L) 03/10/2019   HCT 37.0 (L) 03/10/2019   MCV 87.9 03/10/2019   PLT 132 (L) 03/10/2019    Assessment/Plan Glioblastoma ([place])  Cody [name] is clinically stable today.  Labs are WNL.    Cody [name] is cleared today for cycle #4 of Avastin, 162m/kg IV q2 weeks.    We recommended continuing treatment with cycle #2 Temozolomide 1555mm2, on for five days and off for twenty three days in twenty eight day cycles. The patient will have a complete blood count performed on days 21 and 28 of each cycle, and a comprehensive metabolic panel performed on day 28 of each cycle. Labs may need to be performed more often. Zofran will prescribed for home use for nausea/vomiting.    Currently on day 19/28 of Temodar cycle #2.  Avastin should be held for the following:  ANC less than 500  Platelets less than 50,000  LFT or creatinine greater than 2x ULN  If clinical concerns/contraindications develop  Chemotherapy should be held for the following:  ANC less than 1,000  Platelets less than 100,000  LFT or creatinine greater than 2x ULN  If clinical concerns/contraindications develop  We recommended decreasing decadron to 62m89mOD.  We appreciate the opportunity to participate in the care of MicTRAFTON ROKER  Cody [name] will return to clinic following next MRI scheduled for 03/17/19.  All questions were answered. The patient knows to call the clinic with any problems, questions or concerns. No barriers to learning were detected.  The total time spent in the encounter was 25 minutes and more than 50% was on counseling and review of test results   ZacVentura SellersD Medical Director of Neuro-Oncology [facility] [place]/05/21 9:21 AM

## 2019-03-13 ENCOUNTER — Other Ambulatory Visit: Payer: Self-pay | Admitting: Internal Medicine

## 2019-03-13 DIAGNOSIS — C719 Malignant neoplasm of brain, unspecified: Secondary | ICD-10-CM

## 2019-03-13 NOTE — Telephone Encounter (Signed)
Patient was seen last Friday 03/10/2019.  I don't see where Temodar was reordered.  Office note looks like he is to still be on it.  Can you please review for refill?  Thanks, Maudie Mercury

## 2019-03-15 ENCOUNTER — Ambulatory Visit
Admission: RE | Admit: 2019-03-15 | Discharge: 2019-03-15 | Disposition: A | Discharge status: Home / Self Care | Payer: BC Managed Care – PPO | Source: Ambulatory Visit | Attending: Internal Medicine | Admitting: Internal Medicine

## 2019-03-15 ENCOUNTER — Other Ambulatory Visit: Payer: Self-pay

## 2019-03-15 DIAGNOSIS — C719 Malignant neoplasm of brain, unspecified: Secondary | ICD-10-CM

## 2019-03-15 IMAGING — MR MR HEAD WO/W CM
11 series · 48 of 48 positions shown · IV contrast (multihance)
Comparison: Brain MRI [DATE]

CLINICAL DATA: Glioblastoma. Brain/CNS neoplasm, assess treatment
response. Additional history obtained from electronic medical
record: Recent Avastin therapy

EXAM:
MRI HEAD WITHOUT AND WITH CONTRAST
TECHNIQUE: Multiplanar, multiecho pulse sequences of the brain and surrounding
structures were obtained without and with intravenous contrast.
CONTRAST:  17mL MULTIHANCE GADOBENATE DIMEGLUMINE 529 MG/ML IV SOLN

[Series 2: FLAIR · sagittal · 3.0mm · 0.75mm/px · 2 of 39 slices shown (1 of 2)]
[im 1/39]
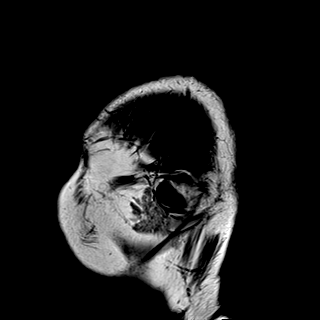
[im 39/39]
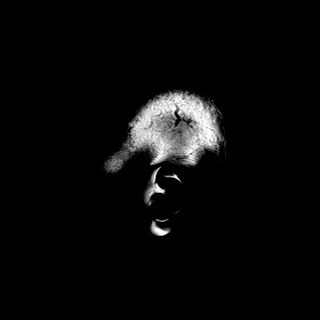

[Series 3: DWI · axial · 3.0mm · 1.50mm/px · z∈[-77,+71]mm · 5 of 78 slices shown (1 of 2)]
[im 1/78]
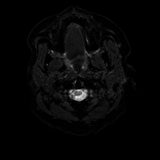
[im 20/78]
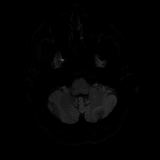
[im 39/78]
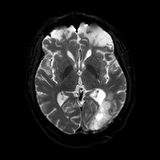
[im 58/78]
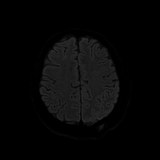
[im 78/78]
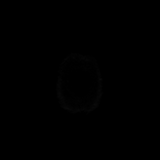

[Series 4: DWI · axial · 3.0mm · 1.50mm/px · z∈[-77,+71]mm · 2 of 39 slices shown (2 of 2)]
[im 1/39]
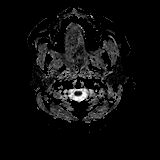
[im 39/39]
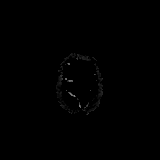

[Series 5: T2 · axial · 5.0mm · 0.60mm/px · z∈[-81,+75]mm · 2 of 27 slices shown]
[im 1/27]
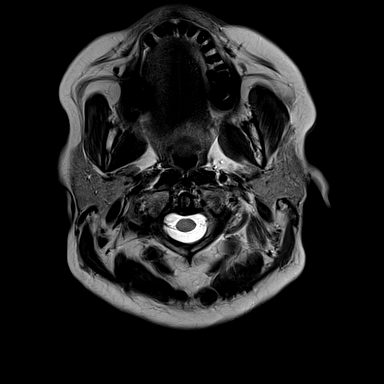
[im 27/27]
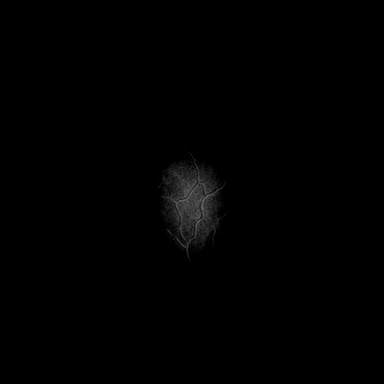

[Series 7: swi_images · axial · 1.5mm · 0.90mm/px · z∈[-74,+68]mm · 6 of 96 slices shown]
[im 1/96]
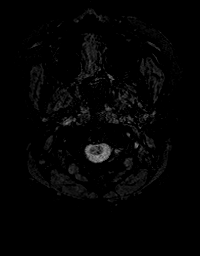
[im 20/96]
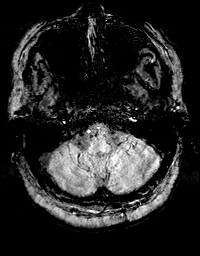
[im 39/96]
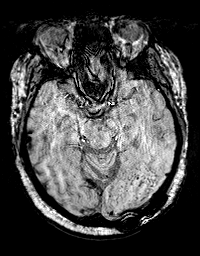
[im 58/96]
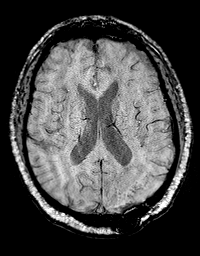
[im 77/96]
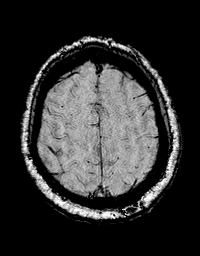
[im 96/96]
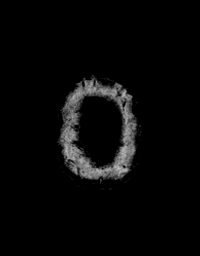

[Series 8: FLAIR · axial · 3.0mm · 0.60mm/px · z∈[-79,+74]mm · 3 of 52 slices shown (2 of 2)]
[im 1/52]
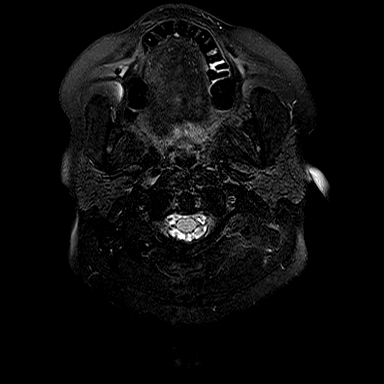
[im 26/52]
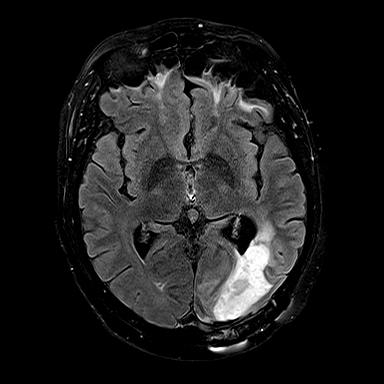
[im 52/52]
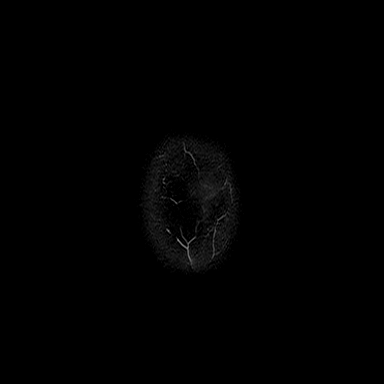

[Series 9: T1 · axial · 1.0mm · 0.75mm/px · z∈[-82,+77]mm · 10 of 160 slices shown]
[im 1/160]
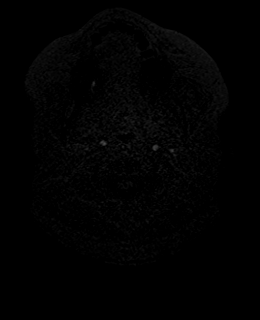
[im 18/160]
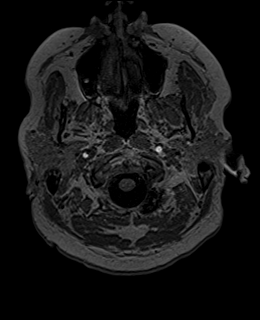
[im 36/160]
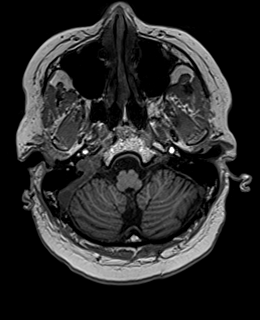
[im 54/160]
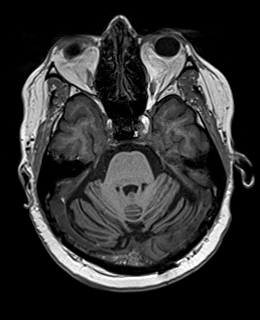
[im 71/160]
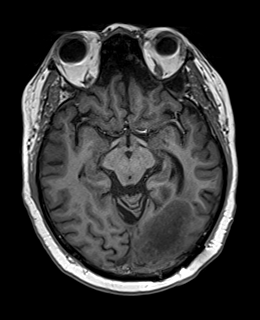
[im 89/160]
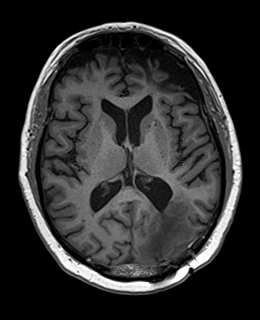
[im 107/160]
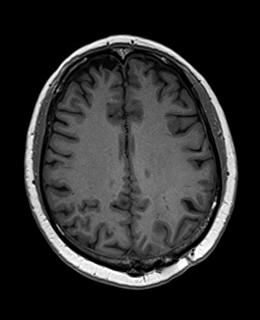
[im 124/160]
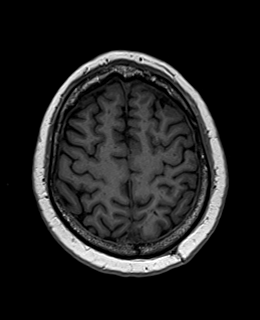
[im 142/160]
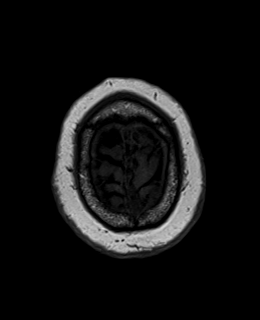
[im 160/160]
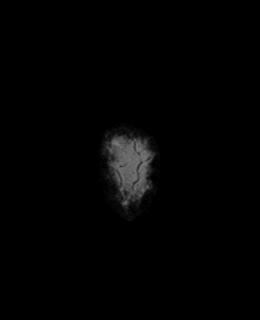

[Series 10: T2 post-contrast · coronal · 3.0mm · 0.57mm/px · 3 of 47 slices shown]
[im 1/47]
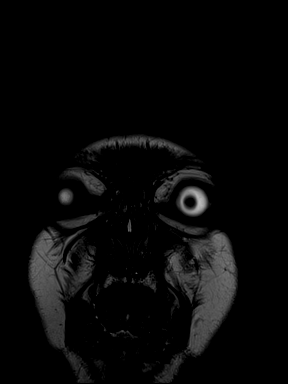
[im 24/47]
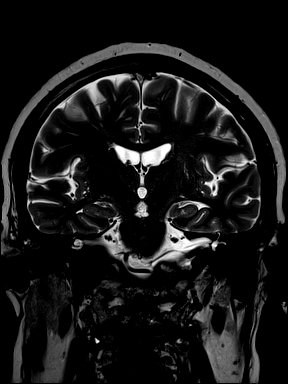
[im 47/47]
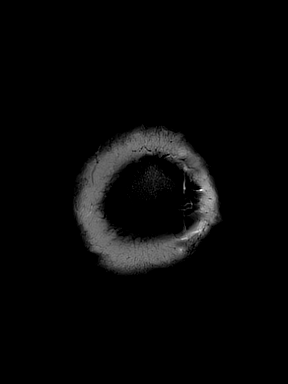

[Series 11: T1 post-contrast · axial · 1.0mm · 0.75mm/px · z∈[-82,+77]mm · 10 of 160 slices shown (1 of 2)]
[im 1/160]
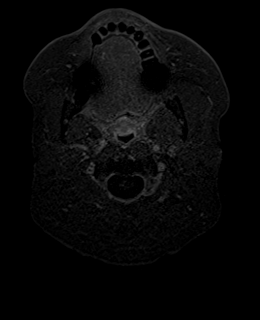
[im 18/160]
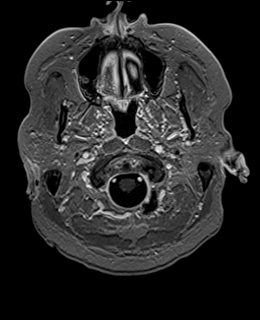
[im 36/160]
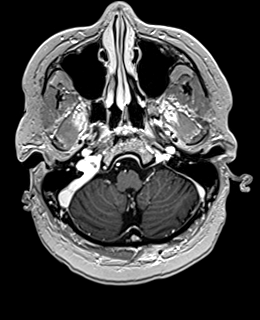
[im 54/160]
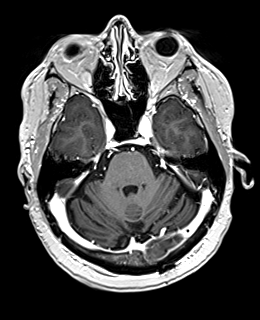
[im 71/160]
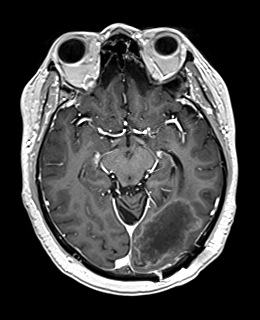
[im 89/160]
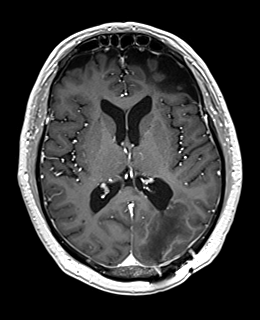
[im 107/160]
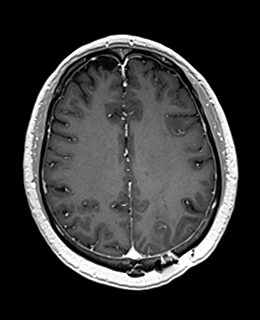
[im 124/160]
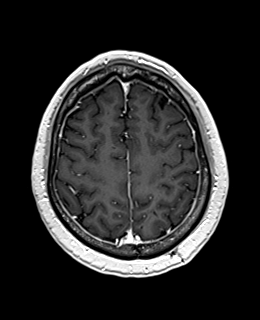
[im 142/160]
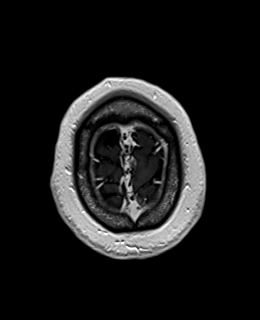
[im 160/160]
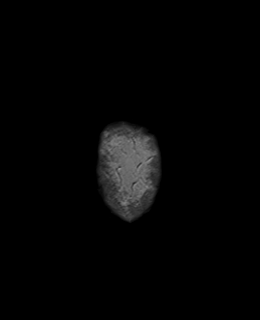

[Series 12: T1 post-contrast · coronal · 3.0mm · 0.57mm/px · 3 of 49 slices shown (2 of 2)]
[im 1/49]
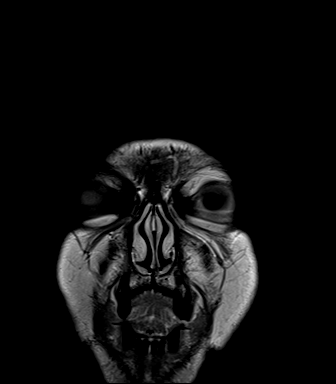
[im 25/49]
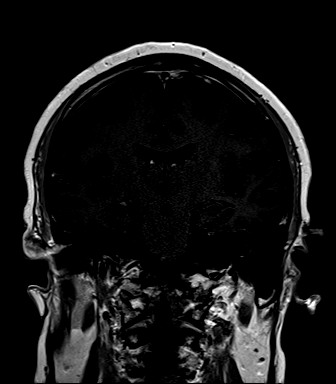
[im 49/49]
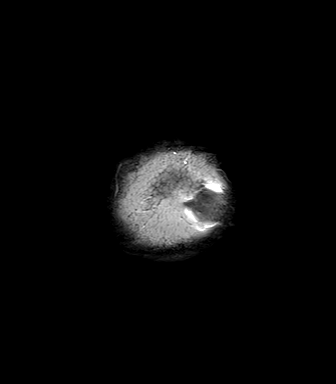

[Series 13: FLAIR post-contrast · sagittal · 3.0mm · 0.75mm/px · 2 of 39 slices shown]
[im 1/39]
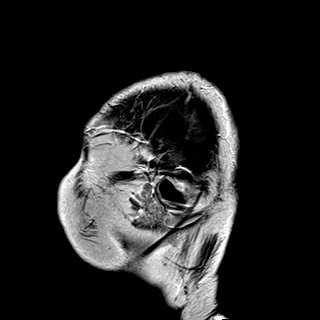
[im 39/39]
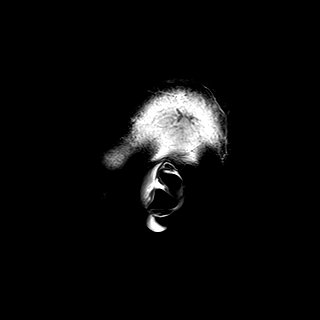

[48 of 48 positions shown; findings below may reference images not displayed]

FINDINGS: Brain:

A peripherally enhancing mass centered within the left occipital
lobe is similar to minimally decreased in size as compared to MRI
[DATE]. This mass measures 6.5 x 4.6 x 5.5 cm on the current
exam ([DATE] x 4.7 x 5.6 cm when measured in similar fashion on the
previous exam). Enhancement along the periphery of the mass has
decreased in conspicuity and thickness. Heterogeneous enhancement
centrally within the mass has also decreased in conspicuity. There
is prominent restricted diffusion associated with the mass which has
increased in conspicuity and become more confluent. There has been a
marked interval decrease in T2 hyperintensity surrounding the lesion
as well as a significant interval decrease in associated mass
effect. Previously demonstrated effacement of the posterior horn of
the left lateral ventricle has near completely resolved. No
significant residual midline shift.

Unchanged regions of encephalomalacia and gliosis within the
anterior frontal lobes bilaterally. No ventriculomegaly or
extra-axial fluid collection.

Vascular: Flow voids maintained within the proximal large arterial
vessels. Expected enhancement within the proximal large arterial
vessels and dural venous sinuses.

Skull and upper cervical spine: Left parietooccipital cranioplasty.
No focal marrow lesion.

Sinuses/Orbits: Visualized orbits demonstrate no acute abnormality.
Trace ethmoid sinus mucosal thickening. No significant mastoid
effusion.
IMPRESSION: A peripherally enhancing mass centered within the left occipital
lobe is similar to minimally decreased in size since MRI [DATE].
Peripheral and irregular central enhancement associated with the
mass have decreased in conspicuity. There has also been a marked
interval decrease in surrounding T2 hyperintensity and associated
mass effect. Restricted diffusion associated with the mass has
increased in conspicuity and become more confluent, and this may
reflect necrosis or hypercellular tumor.

## 2019-03-15 MED ORDER — GADOBENATE DIMEGLUMINE 529 MG/ML IV SOLN
17.0000 mL | Freq: Once | INTRAVENOUS | Status: AC | PRN
  Administered 2019-03-15: 12:00:00 17 mL via INTRAVENOUS

## 2019-03-15 MED ORDER — TEMOZOLOMIDE 100 MG PO CAPS: 150.0000 mg/m2/d | ORAL_CAPSULE | Freq: Every day | ORAL | 0 refills | Status: DC

## 2019-03-15 MED FILL — TEMOZOLOMIDE 100 MG CAPS: 100 | 5 days supply | Qty: 15 | Fill #0

## 2019-03-20 ENCOUNTER — Inpatient Hospital Stay: Payer: BC Managed Care – PPO

## 2019-03-23 ENCOUNTER — Telehealth: Payer: Self-pay | Admitting: Internal Medicine

## 2019-03-23 NOTE — Telephone Encounter (Signed)
Scheduled appt per 2/18 r/s due to weather - pt aware of appt date and time

## 2019-03-24 ENCOUNTER — Inpatient Hospital Stay: Payer: BC Managed Care – PPO

## 2019-03-24 ENCOUNTER — Inpatient Hospital Stay (HOSPITAL_BASED_OUTPATIENT_CLINIC_OR_DEPARTMENT_OTHER): Payer: BC Managed Care – PPO | Admitting: Internal Medicine

## 2019-03-24 ENCOUNTER — Other Ambulatory Visit: Payer: Self-pay

## 2019-03-24 ENCOUNTER — Inpatient Hospital Stay: Payer: BC Managed Care – PPO | Admitting: Internal Medicine

## 2019-03-24 VITALS — BP 141/94 | HR 64 | Temp 98.3°F | Resp 18 | Ht 72.0 in | Wt 208.3 lb

## 2019-03-24 VITALS — BP 143/79

## 2019-03-24 DIAGNOSIS — C719 Malignant neoplasm of brain, unspecified: Secondary | ICD-10-CM

## 2019-03-24 DIAGNOSIS — C714 Malignant neoplasm of occipital lobe: Secondary | ICD-10-CM | POA: Diagnosis not present

## 2019-03-24 DIAGNOSIS — R569 Unspecified convulsions: Secondary | ICD-10-CM

## 2019-03-24 LAB — CMP (CANCER CENTER ONLY)
ALT: 37 U/L (ref 0–44)
AST: 19 U/L (ref 15–41)
Albumin: 3.5 g/dL (ref 3.5–5.0)
Alkaline Phosphatase: 93 U/L (ref 38–126)
Anion gap: 6 (ref 5–15)
BUN: 13 mg/dL (ref 6–20)
CO2: 26 mmol/L (ref 22–32)
Calcium: 8.8 mg/dL — ABNORMAL LOW (ref 8.9–10.3)
Chloride: 108 mmol/L (ref 98–111)
Creatinine: 1.22 mg/dL (ref 0.61–1.24)
GFR, Est AFR Am: >60 mL/min (ref >60)
GFR, Estimated: >60 mL/min (ref >60)
Glucose, Bld: 92 mg/dL (ref 70–99)
Potassium: 3.8 mmol/L (ref 3.5–5.1)
Sodium: 140 mmol/L (ref 135–145)
Total Bilirubin: 0.7 mg/dL (ref 0.3–1.2)
Total Protein: 6.3 g/dL — ABNORMAL LOW (ref 6.5–8.1)

## 2019-03-24 LAB — CBC WITH DIFFERENTIAL (CANCER CENTER ONLY)
Abs Immature Granulocytes: 0.05 10*3/uL (ref 0.00–0.07)
Basophils Absolute: 0 10*3/uL (ref 0.0–0.1)
Basophils Relative: 0 %
Eosinophils Absolute: 0.1 10*3/uL (ref 0.0–0.5)
Eosinophils Relative: 1 %
HCT: 38.7 % — ABNORMAL LOW (ref 39.0–52.0)
Hemoglobin: 13.4 g/dL (ref 13.0–17.0)
Immature Granulocytes: 1 %
Lymphocytes Relative: 18 %
Lymphs Abs: 1.2 10*3/uL (ref 0.7–4.0)
MCH: 30.6 pg (ref 26.0–34.0)
MCHC: 34.6 g/dL (ref 30.0–36.0)
MCV: 88.4 fL (ref 80.0–100.0)
Monocytes Absolute: 0.6 10*3/uL (ref 0.1–1.0)
Monocytes Relative: 9 %
Neutro Abs: 4.6 10*3/uL (ref 1.7–7.7)
Neutrophils Relative %: 71 %
Platelet Count: 117 10*3/uL — ABNORMAL LOW (ref 150–400)
RBC: 4.38 MIL/uL (ref 4.22–5.81)
RDW: 14.6 % (ref 11.5–15.5)
WBC Count: 6.6 10*3/uL (ref 4.0–10.5)
nRBC: 0 % (ref 0.0–0.2)

## 2019-03-24 LAB — TOTAL PROTEIN, URINE DIPSTICK: Protein, ur: NEGATIVE mg/dL

## 2019-03-24 MED ORDER — SODIUM CHLORIDE 0.9 % IV SOLN
Freq: Once | INTRAVENOUS | Status: AC
  Filled 2019-03-24: qty 250

## 2019-03-24 MED ORDER — SODIUM CHLORIDE 0.9 % IV SOLN
10.0000 mg/kg | Freq: Once | INTRAVENOUS | Status: AC
  Administered 2019-03-24: 900 mg via INTRAVENOUS
  Filled 2019-03-24: qty 32

## 2019-03-24 NOTE — Progress Notes (Signed)
[address] [facility] [place], [place] [address] [phone]   Interval Evaluation  Date of Service: 03/24/19 Patient Name: Cody [name] Patient MRN: [id number] Patient DOB: 1960/09/17 Provider: Ventura Sellers, MD  Identifying Statement:  Cody [name] is a 59 y.o. male with left occipital glioblastoma   Oncologic History: Oncology History  Glioblastoma ([facility])  09/29/2018 Surgery   Craniotomy, resection with Dr. Kathyrn Sheriff   10/21/2018 - 10/31/2018 Chemotherapy   The patient had [No matching medication found in this treatment plan]  for chemotherapy treatment.    01/09/2019 -  Chemotherapy   The patient had [No matching medication found in this treatment plan]  for chemotherapy treatment.    01/23/2019 -  Chemotherapy   The patient had bevacizumab-bvzr (ZIRABEV) 800 mg in sodium chloride 0.9 % 100 mL chemo infusion, 10 mg/kg = 800 mg, Intravenous,  Once, 4 of 6 cycles Administration: 800 mg (01/23/2019), 800 mg (02/09/2019), 900 mg (02/24/2019), 900 mg (03/10/2019)  for chemotherapy treatment.      Biomarkers:  MGMT Unknown.  IDH 1/2 Wild type.  EGFR Unknown  TERT Unknown   Interval History:  Cody [name] presents today for avastin infusion.  Cody [name] and his wife describe no new or progressive neurologic defcits.  Vision on the right side is still impaired as prior. Cody [name] maintains full functional status otherwise.  No issues with gait or motor control. Cody [name] has had no recurrent seizure events.  Currently taking 0.66m decadron daily.  H+P (10/18/18) Patient presented to medical attention in August 2020 with several weeks history of new onset headaches.  Progressive Cody of symptoms led to an ED admission, where CNS imaging demonstrated enhancing mass in the left occipital lobe.  Cody [name] underwent craniotomy and resection with Dr. [name] 80/35/00without complication.  Cody [name] was discharged to home with small visual field  impairment and otherwise functionally intact.  Cody [name] denies headaches or any seizures since surgery, having completed decadron taper.    Medications: Current Outpatient Medications on File Prior to Visit  Medication Sig Dispense Refill  . dexamethasone (DECADRON) 1 MG tablet Take 2 tablets (2 mg total) by mouth daily. (Patient taking differently: Take 1 mg by mouth daily. ) 60 tablet 1  . finasteride (PROSCAR) 5 MG tablet Take 5 mg by mouth daily.    .Marland KitchenlevETIRAcetam (KEPPRA) 500 MG tablet TAKE 2 TABLETS(1000 MG) BY MOUTH TWICE DAILY 60 tablet 3  . ondansetron (ZOFRAN) 8 MG tablet Take 1 tablet (8 mg total) by mouth 2 (two) times daily as needed (nausea and vomiting). May take 30-60 minutes prior to Temodar administration if nausea/vomiting occurs. (Patient not taking: Reported on 02/09/2019) 30 tablet 1  . tamsulosin (FLOMAX) 0.4 MG CAPS capsule Take 0.4 mg by mouth daily.    .Marland Kitchentemozolomide (TEMODAR) 100 MG capsule Take 3 capsules (300 mg total) by mouth daily. May take on an empty stomach to decrease nausea & vomiting. 15 capsule 0  . traMADol (ULTRAM) 50 MG tablet TAKE 1 TABLET(50 MG) BY MOUTH EVERY 6 HOURS AS NEEDED FOR SEVERE PAIN (Patient not taking: Reported on 01/23/2019) 30 tablet 1   No current facility-administered medications on file prior to visit.    Allergies: No Known Allergies Past Medical History:  Past Medical History:  Diagnosis Date  . Enlarged prostate   . Migraines    Past Surgical History:  Past Surgical History:  Procedure Laterality Date  . APPLICATION OF CRANIAL NAVIGATION Left  09/29/2018   Procedure: APPLICATION OF CRANIAL NAVIGATION;  Surgeon: [name], MD;  Location: [facility];  Service: Neurosurgery;  Laterality: Left;  . CRANIOTOMY Left 09/29/2018   Procedure: Sterotactic Left craniotomy for resection of tumor;  Surgeon: [name], MD;  Location: [facility];  Service: Neurosurgery;  Laterality: Left;  Sterotactic Left craniotomy for resection of tumor    Social History:  Social History   Socioeconomic History  . Marital status: Married    Spouse name: Not on file  . Number of children: Not on file  . Years of education: Not on file  . Highest education level: Not on file  Occupational History  . Not on file  Tobacco Use  . Smoking status: Never Smoker  . Smokeless tobacco: Never Used  Substance and Sexual Activity  . Alcohol use: Never  . Drug use: Never  . Sexual activity: Not on file  Other Topics Concern  . Not on file  Social History Narrative  . Not on file   Social Determinants of Health   Financial Resource Strain:   . Difficulty of Paying Living Expenses: Not on file  Food Insecurity:   . Worried About Charity fundraiser in the Last Year: Not on file  . Ran Out of Food in the Last Year: Not on file  Transportation Needs: Unmet Transportation Needs  . Lack of Transportation (Medical): Yes  . Lack of Transportation (Non-Medical): No  Physical Activity:   . Days of Exercise per Week: Not on file  . Minutes of Exercise per Session: Not on file  Stress:   . Feeling of Stress : Not on file  Social Connections:   . Frequency of Communication with Friends and Family: Not on file  . Frequency of Social Gatherings with Friends and Family: Not on file  . Attends Religious Services: Not on file  . Active Member of Clubs or Organizations: Not on file  . Attends Archivist Meetings: Not on file  . Marital Status: Not on file  Intimate Partner Violence: Not At Risk  . Fear of Current or Ex-Partner: No  . Emotionally Abused: No  . Physically Abused: No  . Sexually Abused: No   Family History: No family history on file.  Review of Systems: Constitutional: Denies fevers, chills or abnormal weight loss Eyes: Denies blurriness of vision Ears, nose, mouth, throat, and face: Denies mucositis or sore throat Respiratory: Denies cough, dyspnea or wheezes Cardiovascular: Denies palpitation, chest discomfort or  lower extremity swelling Gastrointestinal:  Denies nausea, constipation, diarrhea GU: Denies dysuria or incontinence Skin: Denies abnormal skin rashes Neurological: Per HPI Musculoskeletal: Denies joint pain, back or neck discomfort. No decrease in ROM Behavioral/Psych: Denies anxiety, disturbance in thought content, and mood instability  Physical Exam: There were no vitals filed for this visit. KPS: 80. General: Alert, cooperative, pleasant, in no acute distress Head: Craniotomy scar noted, dry and intact. EENT: No conjunctival injection or scleral icterus. Oral mucosa moist Lungs: Resp effort normal Cardiac: Regular rate and rhythm Abdomen: Soft, non-distended abdomen Skin: No rashes cyanosis or petechiae. Extremities: No clubbing or edema  Neurologic Exam: Mental Status: Awake, alert, attentive to examiner. Oriented to self and environment. Subtle mixed dysphasia motor>sensory. Cranial Nerves: Visual acuity is grossly normal. Right homonymous hemianopia. Extra-ocular movements intact. No ptosis. Face is symmetric, tongue midline. Motor: Tone and bulk are normal. Power is full in both arms and legs. Reflexes are symmetric, no pathologic reflexes present. Intact finger to nose bilaterally Sensory:  Intact to light touch and temperature Gait: Deferred  Labs: I have reviewed the data as listed    Component Value Date/Time   NA 142 03/10/2019 0906   K 3.9 03/10/2019 0906   CL 108 03/10/2019 0906   CO2 25 03/10/2019 0906   GLUCOSE 88 03/10/2019 0906   BUN 17 03/10/2019 0906   CREATININE 1.15 03/10/2019 0906   CALCIUM 8.5 (L) 03/10/2019 0906   PROT 6.1 (L) 03/10/2019 0906   ALBUMIN 3.4 (L) 03/10/2019 0906   AST 17 03/10/2019 0906   ALT 42 03/10/2019 0906   ALKPHOS 100 03/10/2019 0906   BILITOT 0.5 03/10/2019 0906   GFRNONAA >60 03/10/2019 0906   GFRAA >60 03/10/2019 0906   Lab Results  Component Value Date   WBC 6.6 03/24/2019   NEUTROABS 4.6 03/24/2019   HGB 13.4  03/24/2019   HCT 38.7 (L) 03/24/2019   MCV 88.4 03/24/2019   PLT 117 (L) 03/24/2019   Imaging:  [facility] Clinician Interpretation: I have personally reviewed the CNS images as listed.  My interpretation, in the context of the patient's clinical presentation, is stable disease  MR Brain W Wo Contrast  Result Date: 03/15/2019 CLINICAL DATA:  Glioblastoma. Brain/CNS neoplasm, assess treatment response. Additional history obtained from [facility] Avastin therapy EXAM: MRI HEAD WITHOUT AND WITH CONTRAST TECHNIQUE: Multiplanar, multiecho pulse sequences of the brain and surrounding structures were obtained without and with intravenous contrast. CONTRAST:  14m MULTIHANCE GADOBENATE DIMEGLUMINE 529 MG/ML IV SOLN COMPARISON:  Brain MRI 01/05/2019 FINDINGS: Brain: A peripherally enhancing mass centered within the left occipital lobe is similar to minimally decreased in size as compared to MRI 01/05/2019. This mass measures 6.5 x 4.6 x 5.5 cm on the current exam (6.6 x 4.7 x 5.6 cm when measured in similar fashion on the previous exam). Enhancement along the periphery of the mass has decreased in conspicuity and thickness. Heterogeneous enhancement centrally within the mass has also decreased in conspicuity. There is prominent restricted diffusion associated with the mass which has increased in conspicuity and become more confluent. There has been a marked interval decrease in T2 hyperintensity surrounding the lesion as well as a significant interval decrease in associated mass effect. Previously demonstrated effacement of the posterior horn of the left lateral ventricle has near completely resolved. No significant residual midline shift. Unchanged regions of encephalomalacia and gliosis within the anterior frontal lobes bilaterally. No ventriculomegaly or extra-axial fluid collection. Vascular: Flow voids maintained within the proximal large arterial vessels. Expected enhancement within the  proximal large arterial vessels and dural venous sinuses. Skull and upper cervical spine: Left parietooccipital cranioplasty. No focal marrow lesion. Sinuses/Orbits: Visualized orbits demonstrate no acute abnormality. Trace ethmoid sinus mucosal thickening. No significant mastoid effusion. IMPRESSION: A peripherally enhancing mass centered within the left occipital lobe is similar to minimally decreased in size since MRI 01/05/2019. Peripheral and irregular central enhancement associated with the mass have decreased in conspicuity. There has also been a marked interval decrease in surrounding T2 hyperintensity and associated mass effect. Restricted diffusion associated with the mass has increased in conspicuity and become more confluent, and this may reflect necrosis or hypercellular tumor. Electronically Signed   By: KKellie SimmeringDO   On: 03/15/2019 18:58    Assessment/Plan [place]li[place] ([facility]  Focal seizures ([facility]  Cody [name] clinically and radiographically stable today.  Labs are WNL.    Cody [name] is cleared today for Avastin infusion, 139mkg IV q2 weeks.    We recommended continuing treatment  with cycle #3 Temozolomide 156m/m2, on for five days and off for twenty three days in twenty eight day cycles. The patient will have a complete blood count performed on days 21 and 28 of each cycle, and a comprehensive metabolic panel performed on day 28 of each cycle. Labs may need to be performed more often. Zofran will prescribed for home use for nausea/vomiting.   Currently on day 5/28 of cycle #3  Avastin should be held for the following:  ANC less than 500  Platelets less than 50,000  LFT or creatinine greater than 2x ULN  If clinical concerns/contraindications develop  Chemotherapy should be held for the following:  ANC less than 1,000  Platelets less than 100,000  LFT or creatinine greater than 2x ULN  If clinical concerns/contraindications develop  We recommended discontinuing  decadron later this month, when recovered from current cycle of TMZ.  We appreciate the opportunity to participate in the care of Cody [name]    Cody [name] will return to clinic in 2 weeks for avastin infusion.  All questions were answered. The patient knows to call the clinic with any problems, questions or concerns. No barriers to learning were detected.  The total time spent in the encounter was 30 minutes and more than 50% was on counseling and review of test results   ZVentura Sellers MD Medical Director of Neuro-Oncology [facility]at [place]02/19/21 10:41 AM

## 2019-03-24 NOTE — Patient Instructions (Signed)
[facility] Discharge Instructions for Patients Receiving Chemotherapy  Today you received the following chemotherapy agents: bevacizumab  To help prevent nausea and vomiting after your treatment, we encourage you to take your nausea medication as directed.   If you develop nausea and vomiting that is not controlled by your nausea medication, call the clinic.   BELOW ARE SYMPTOMS THAT SHOULD BE REPORTED IMMEDIATELY:  *FEVER GREATER THAN 100.5 F  *CHILLS WITH OR WITHOUT FEVER  NAUSEA AND VOMITING THAT IS NOT CONTROLLED WITH YOUR NAUSEA MEDICATION  *UNUSUAL SHORTNESS OF BREATH  *UNUSUAL BRUISING OR BLEEDING  TENDERNESS IN MOUTH AND THROAT WITH OR WITHOUT PRESENCE OF ULCERS  *URINARY PROBLEMS  *BOWEL PROBLEMS  UNUSUAL RASH Items with * indicate a potential emergency and should be followed up as soon as possible.  Feel free to call the clinic should you have any questions or concerns. The clinic phone number is [phone].  Please show the CHEMO ALERT CARD at check-in to the Emergency Department and triage nurse.   

## 2019-03-27 ENCOUNTER — Telehealth: Payer: Self-pay | Admitting: Internal Medicine

## 2019-03-27 NOTE — Telephone Encounter (Signed)
Scheduled per 2/19 los. Called and spoke with pt, confirmed 3/5 appt

## 2019-04-03 ENCOUNTER — Telehealth: Payer: Self-pay | Admitting: *Deleted

## 2019-04-03 NOTE — Telephone Encounter (Signed)
Patient called to report that he has been having daily headaches that are managed with Extra Strength Tynelol.  He states that the frequency was once sporadic and is now daily.    Routed to Dr. Mickeal Skinner

## 2019-04-03 NOTE — Telephone Encounter (Signed)
Patient called to report daily headaches.  Per Dr. Mickeal Skinner and his recent discontinuation of Decadron. He advised patient to start back taking Decadron1 mg daily.    Relayed to patient and he expressed understanding.

## 2019-04-07 ENCOUNTER — Other Ambulatory Visit: Payer: Self-pay

## 2019-04-07 ENCOUNTER — Inpatient Hospital Stay: Payer: BC Managed Care – PPO | Attending: Internal Medicine

## 2019-04-07 ENCOUNTER — Inpatient Hospital Stay: Payer: BC Managed Care – PPO

## 2019-04-07 ENCOUNTER — Inpatient Hospital Stay (HOSPITAL_BASED_OUTPATIENT_CLINIC_OR_DEPARTMENT_OTHER): Payer: BC Managed Care – PPO | Admitting: Internal Medicine

## 2019-04-07 VITALS — BP 136/77 | HR 60 | Resp 16

## 2019-04-07 VITALS — BP 138/92 | HR 67 | Temp 98.7°F | Resp 18 | Ht 72.0 in | Wt 205.9 lb

## 2019-04-07 DIAGNOSIS — C714 Malignant neoplasm of occipital lobe: Secondary | ICD-10-CM | POA: Diagnosis present

## 2019-04-07 DIAGNOSIS — R519 Headache, unspecified: Secondary | ICD-10-CM | POA: Diagnosis not present

## 2019-04-07 DIAGNOSIS — R569 Unspecified convulsions: Secondary | ICD-10-CM | POA: Insufficient documentation

## 2019-04-07 DIAGNOSIS — C719 Malignant neoplasm of brain, unspecified: Secondary | ICD-10-CM

## 2019-04-07 DIAGNOSIS — N400 Enlarged prostate without lower urinary tract symptoms: Secondary | ICD-10-CM | POA: Diagnosis not present

## 2019-04-07 DIAGNOSIS — Z9221 Personal history of antineoplastic chemotherapy: Secondary | ICD-10-CM | POA: Insufficient documentation

## 2019-04-07 DIAGNOSIS — R112 Nausea with vomiting, unspecified: Secondary | ICD-10-CM | POA: Diagnosis not present

## 2019-04-07 DIAGNOSIS — Z5112 Encounter for antineoplastic immunotherapy: Secondary | ICD-10-CM | POA: Insufficient documentation

## 2019-04-07 DIAGNOSIS — Z79899 Other long term (current) drug therapy: Secondary | ICD-10-CM | POA: Insufficient documentation

## 2019-04-07 LAB — CMP (CANCER CENTER ONLY)
ALT: 57 U/L — ABNORMAL HIGH (ref 0–44)
AST: 31 U/L (ref 15–41)
Albumin: 3.7 g/dL (ref 3.5–5.0)
Alkaline Phosphatase: 79 U/L (ref 38–126)
Anion gap: 9 (ref 5–15)
BUN: 8 mg/dL (ref 6–20)
CO2: 24 mmol/L (ref 22–32)
Calcium: 8.6 mg/dL — ABNORMAL LOW (ref 8.9–10.3)
Chloride: 109 mmol/L (ref 98–111)
Creatinine: 1.14 mg/dL (ref 0.61–1.24)
GFR, Est AFR Am: >60 mL/min (ref >60)
GFR, Estimated: >60 mL/min (ref >60)
Glucose, Bld: 90 mg/dL (ref 70–99)
Potassium: 3.7 mmol/L (ref 3.5–5.1)
Sodium: 142 mmol/L (ref 135–145)
Total Bilirubin: 0.6 mg/dL (ref 0.3–1.2)
Total Protein: 6.5 g/dL (ref 6.5–8.1)

## 2019-04-07 LAB — CBC WITH DIFFERENTIAL (CANCER CENTER ONLY)
Abs Immature Granulocytes: 0.02 10*3/uL (ref 0.00–0.07)
Basophils Absolute: 0 10*3/uL (ref 0.0–0.1)
Basophils Relative: 0 %
Eosinophils Absolute: 0.1 10*3/uL (ref 0.0–0.5)
Eosinophils Relative: 1 %
HCT: 37.5 % — ABNORMAL LOW (ref 39.0–52.0)
Hemoglobin: 13.2 g/dL (ref 13.0–17.0)
Immature Granulocytes: 0 %
Lymphocytes Relative: 15 %
Lymphs Abs: 1 10*3/uL (ref 0.7–4.0)
MCH: 31 pg (ref 26.0–34.0)
MCHC: 35.2 g/dL (ref 30.0–36.0)
MCV: 88 fL (ref 80.0–100.0)
Monocytes Absolute: 0.5 10*3/uL (ref 0.1–1.0)
Monocytes Relative: 8 %
Neutro Abs: 5.2 10*3/uL (ref 1.7–7.7)
Neutrophils Relative %: 76 %
Platelet Count: 179 10*3/uL (ref 150–400)
RBC: 4.26 MIL/uL (ref 4.22–5.81)
RDW: 13.8 % (ref 11.5–15.5)
WBC Count: 6.9 10*3/uL (ref 4.0–10.5)
nRBC: 0 % (ref 0.0–0.2)

## 2019-04-07 LAB — TOTAL PROTEIN, URINE DIPSTICK: Protein, ur: NEGATIVE mg/dL

## 2019-04-07 MED ORDER — SODIUM CHLORIDE 0.9 % IV SOLN
10.0000 mg/kg | Freq: Once | INTRAVENOUS | Status: AC
  Administered 2019-04-07: 900 mg via INTRAVENOUS
  Filled 2019-04-07: qty 32

## 2019-04-07 MED ORDER — SODIUM CHLORIDE 0.9 % IV SOLN
Freq: Once | INTRAVENOUS | Status: AC
  Filled 2019-04-07: qty 250

## 2019-04-07 NOTE — Patient Instructions (Signed)
[facility] Discharge Instructions for Patients Receiving Chemotherapy  Today you received the following chemotherapy agents: bevacizumab  To help prevent nausea and vomiting after your treatment, we encourage you to take your nausea medication as directed.   If you develop nausea and vomiting that is not controlled by your nausea medication, call the clinic.   BELOW ARE SYMPTOMS THAT SHOULD BE REPORTED IMMEDIATELY:  *FEVER GREATER THAN 100.5 F  *CHILLS WITH OR WITHOUT FEVER  NAUSEA AND VOMITING THAT IS NOT CONTROLLED WITH YOUR NAUSEA MEDICATION  *UNUSUAL SHORTNESS OF BREATH  *UNUSUAL BRUISING OR BLEEDING  TENDERNESS IN MOUTH AND THROAT WITH OR WITHOUT PRESENCE OF ULCERS  *URINARY PROBLEMS  *BOWEL PROBLEMS  UNUSUAL RASH Items with * indicate a potential emergency and should be followed up as soon as possible.  Feel free to call the clinic should you have any questions or concerns. The clinic phone number is [phone].  Please show the CHEMO ALERT CARD at check-in to the Emergency Department and triage nurse.   

## 2019-04-07 NOTE — Progress Notes (Signed)
[facility] at [place], [place] [address] [phone]   Interval Evaluation  Date of Service: 04/07/19 Patient Name: Cody [name] Patient MRN: [id number] Patient DOB: 1960-08-24 Provider: Ventura Sellers, MD  Identifying Statement:  Cody [name] is a 59 y.o. male with left occipital glioblastoma   Oncologic History: Oncology History  Glioblastoma ([place])  09/29/2018 Surgery   Craniotomy, resection with Dr. Kathyrn Sheriff   10/21/2018 - 10/31/2018 Chemotherapy   The patient had [No matching medication found in this treatment plan]  for chemotherapy treatment.    01/09/2019 -  Chemotherapy   The patient had [No matching medication found in this treatment plan]  for chemotherapy treatment.    01/23/2019 -  Chemotherapy   The patient had bevacizumab-bvzr (ZIRABEV) 800 mg in sodium chloride 0.9 % 100 mL chemo infusion, 10 mg/kg = 800 mg, Intravenous,  Once, 5 of 6 cycles Administration: 800 mg (01/23/2019), 800 mg (02/09/2019), 900 mg (02/24/2019), 900 mg (03/10/2019), 900 mg (03/24/2019)  for chemotherapy treatment.      Biomarkers:  MGMT Unknown.  IDH 1/2 Wild type.  EGFR Unknown  TERT Unknown   Interval History:  Cody [name] presents today for avastin infusion.  He and his wife describe no new or progressive neurologic defcits.  He does describe recurrence of daily headaches since discontinuing steroids, refractory to tylenol.  At time pain is severe and somewhat debilitating. Vision on the right side is still impaired as prior. He maintains full functional status otherwise.  No issues with gait or motor control. He has had no recurrent seizure events.    H+P (10/18/18) Patient presented to medical attention in August 2020 with several weeks history of new onset headaches.  Progressive nature of symptoms led to an ED admission, where CNS imaging demonstrated enhancing mass in the left occipital lobe.  He underwent  craniotomy and resection with Dr. Kathyrn Sheriff on 1/63/84 without complication.  He was discharged to home with small visual field impairment and otherwise functionally intact.  He denies headaches or any seizures since surgery, having completed decadron taper.    Medications: Current Outpatient Medications on File Prior to Visit  Medication Sig Dispense Refill  . dexamethasone (DECADRON) 1 MG tablet Take 2 tablets (2 mg total) by mouth daily. (Patient taking differently: Take 1 mg by mouth daily. ) 60 tablet 1  . finasteride (PROSCAR) 5 MG tablet Take 5 mg by mouth daily.    [name] levETIRAcetam (KEPPRA) 500 MG tablet TAKE 2 TABLETS(1000 MG) BY MOUTH TWICE DAILY 60 tablet 3  . ondansetron (ZOFRAN) 8 MG tablet Take 1 tablet (8 mg total) by mouth 2 (two) times daily as needed (nausea and vomiting). May take 30-60 minutes prior to Temodar administration if nausea/vomiting occurs. (Patient not taking: Reported on 02/09/2019) 30 tablet 1  . tamsulosin (FLOMAX) 0.4 MG CAPS capsule Take 0.4 mg by mouth daily.    [name] temozolomide (TEMODAR) 100 MG capsule Take 3 capsules (300 mg total) by mouth daily. May take on an empty stomach to decrease nausea & vomiting. 15 capsule 0  . traMADol (ULTRAM) 50 MG tablet TAKE 1 TABLET(50 MG) BY MOUTH EVERY 6 HOURS AS NEEDED FOR SEVERE PAIN 30 tablet 1   No current facility-administered medications on file prior to visit.    Allergies: No Known Allergies Past Medical History:  Past Medical History:  Diagnosis Date  . Enlarged prostate   . Migraines    Past Surgical History:  Past Surgical History:  Procedure Laterality Date  . APPLICATION OF CRANIAL NAVIGATION Left 09/29/2018   Procedure: APPLICATION OF CRANIAL NAVIGATION;  Surgeon: [name], MD;  Location: [place] [facility];  Service: Neurosurgery;  Laterality: Left;  . CRANIOTOMY Left 09/29/2018   Procedure: Sterotactic Left craniotomy for resection of tumor;  Surgeon: [name], MD;  Location: [facility] [facility];  Service:  Neurosurgery;  Laterality: Left;  Sterotactic Left craniotomy for resection of tumor   Social History:  Social History   Socioeconomic History  . Marital status: Married    Spouse name: Not on file  . Number of children: Not on file  . Years of education: Not on file  . Highest education level: Not on file  Occupational History  . Not on file  Tobacco Use  . Smoking status: Never Smoker  . Smokeless tobacco: Never Used  Substance and Sexual Activity  . Alcohol use: Never  . Drug use: Never  . Sexual activity: Not on file  Other Topics Concern  . Not on file  Social History Narrative  . Not on file   Social Determinants of Health   Financial Resource Strain:   . Difficulty of Paying Living Expenses: Not on file  Food Insecurity:   . Worried About Charity fundraiser in the Last Year: Not on file  . Ran Out of Food in the Last Year: Not on file  Transportation Needs: Unmet Transportation Needs  . Lack of Transportation (Medical): Yes  . Lack of Transportation (Non-Medical): No  Physical Activity:   . Days of Exercise per Week: Not on file  . Minutes of Exercise per Session: Not on file  Stress:   . Feeling of Stress : Not on file  Social Connections:   . Frequency of Communication with Friends and Family: Not on file  . Frequency of Social Gatherings with Friends and Family: Not on file  . Attends Religious Services: Not on file  . Active Member of Clubs or Organizations: Not on file  . Attends Archivist Meetings: Not on file  . Marital Status: Not on file  Intimate Partner Violence: Not At Risk  . Fear of Current or Ex-Partner: No  . Emotionally Abused: No  . Physically Abused: No  . Sexually Abused: No   Family History: No family history on file.  Review of Systems: Constitutional: Denies fevers, chills or abnormal weight loss Eyes: Denies blurriness of vision Ears, nose, mouth, throat, and face: Denies mucositis or sore throat Respiratory: Denies  cough, dyspnea or wheezes Cardiovascular: Denies palpitation, chest discomfort or lower extremity swelling Gastrointestinal:  Denies nausea, constipation, diarrhea GU: Denies dysuria or incontinence Skin: Denies abnormal skin rashes Neurological: Per HPI Musculoskeletal: Denies joint pain, back or neck discomfort. No decrease in ROM Behavioral/Psych: Denies anxiety, disturbance in thought content, and mood instability  Physical Exam: Vitals:   04/07/19 1106  BP: (!) 138/92  Pulse: 67  Resp: 18  Temp: 98.7 F (37.1 C)  SpO2: 100%   KPS: 80. General: Alert, cooperative, pleasant, in no acute distress Head: Craniotomy scar noted, dry and intact. EENT: No conjunctival injection or scleral icterus. Oral mucosa moist Lungs: Resp effort normal Cardiac: Regular rate and rhythm Abdomen: Soft, non-distended abdomen Skin: No rashes cyanosis or petechiae. Extremities: No clubbing or edema  Neurologic Exam: Mental Status: Awake, alert, attentive to examiner. Oriented to self and environment. Subtle mixed dysphasia motor>sensory. Cranial Nerves: Visual acuity is grossly normal. Right homonymous hemianopia. Extra-ocular movements intact. No ptosis.  Face is symmetric, tongue midline. Motor: Tone and bulk are normal. Power is full in both arms and legs. Reflexes are symmetric, no pathologic reflexes present. Intact finger to nose bilaterally Sensory: Intact to light touch and temperature Gait: Deferred  Labs: I have reviewed the data as listed    Component Value Date/Time   NA 142 04/07/2019 1022   K 3.7 04/07/2019 1022   CL 109 04/07/2019 1022   CO2 24 04/07/2019 1022   GLUCOSE 90 04/07/2019 1022   BUN 8 04/07/2019 1022   CREATININE 1.14 04/07/2019 1022   CALCIUM 8.6 (L) 04/07/2019 1022   PROT 6.5 04/07/2019 1022   ALBUMIN 3.7 04/07/2019 1022   AST 31 04/07/2019 1022   ALT 57 (H) 04/07/2019 1022   ALKPHOS 79 04/07/2019 1022   BILITOT 0.6 04/07/2019 1022   GFRNONAA >60 04/07/2019  1022   GFRAA >60 04/07/2019 1022   Lab Results  Component Value Date   WBC 6.9 04/07/2019   NEUTROABS 5.2 04/07/2019   HGB 13.2 04/07/2019   HCT 37.5 (L) 04/07/2019   MCV 88.0 04/07/2019   PLT 179 04/07/2019    Assessment/Plan Glioblastoma ([place])  Focal seizures ([place])  Mr. Antos is clinically stable today aside from headaches.  Labs are WNL.    He is cleared today for Avastin infusion, 62m/kg IV q2 weeks.    We recommended continuing treatment with cycle #3 Temozolomide 1563mm2, on for five days and off for twenty three days in twenty eight day cycles. The patient will have a complete blood count performed on days 21 and 28 of each cycle, and a comprehensive metabolic panel performed on day 28 of each cycle. Labs may need to be performed more often. Zofran will prescribed for home use for nausea/vomiting.   Currently on day 19/28 of cycle #3  Avastin should be held for the following:  ANC less than 500  Platelets less than 50,000  LFT or creatinine greater than 2x ULN  If clinical concerns/contraindications develop  Chemotherapy should be held for the following:  ANC less than 1,000  Platelets less than 100,000  LFT or creatinine greater than 2x ULN  If clinical concerns/contraindications develop  We recommended re-engaging with discontinuing decadron, or decreasing to 0.79m2maily, once headaches are improved.  He may benefit from hydrocortisone bridge if this is ineffectual.  We appreciate the opportunity to participate in the care of MicRIESE HELLARD  He will return to clinic in 2 weeks for avastin infusion.  All questions were answered. The patient knows to call the clinic with any problems, questions or concerns. No barriers to learning were detected.  The total time spent in the encounter was 30 minutes and more than 50% was on counseling and review of test results   ZacVentura SellersD Medical Director of Neuro-Oncology [facility] [place]/05/21 10:35 AM

## 2019-04-10 ENCOUNTER — Other Ambulatory Visit: Payer: Self-pay | Admitting: Internal Medicine

## 2019-04-10 ENCOUNTER — Telehealth: Payer: Self-pay | Admitting: Internal Medicine

## 2019-04-10 DIAGNOSIS — C719 Malignant neoplasm of brain, unspecified: Secondary | ICD-10-CM

## 2019-04-10 NOTE — Telephone Encounter (Signed)
Scheduled appt per 3/5 los.  Left a voice message of the appt date and time.

## 2019-04-10 NOTE — Telephone Encounter (Signed)
Rx Request 

## 2019-04-21 ENCOUNTER — Other Ambulatory Visit: Payer: Self-pay

## 2019-04-21 ENCOUNTER — Inpatient Hospital Stay: Payer: BC Managed Care – PPO | Admitting: Internal Medicine

## 2019-04-21 ENCOUNTER — Inpatient Hospital Stay: Payer: BC Managed Care – PPO

## 2019-04-21 VITALS — BP 132/84 | HR 60 | Temp 98.9°F | Resp 17 | Ht 72.0 in | Wt 203.2 lb

## 2019-04-21 VITALS — BP 138/84 | HR 51

## 2019-04-21 DIAGNOSIS — C714 Malignant neoplasm of occipital lobe: Secondary | ICD-10-CM | POA: Diagnosis not present

## 2019-04-21 DIAGNOSIS — C719 Malignant neoplasm of brain, unspecified: Secondary | ICD-10-CM

## 2019-04-21 DIAGNOSIS — R569 Unspecified convulsions: Secondary | ICD-10-CM | POA: Diagnosis not present

## 2019-04-21 LAB — CBC WITH DIFFERENTIAL (CANCER CENTER ONLY)
Abs Immature Granulocytes: 0.02 10*3/uL (ref 0.00–0.07)
Basophils Absolute: 0 10*3/uL (ref 0.0–0.1)
Basophils Relative: 0 %
Eosinophils Absolute: 0.1 10*3/uL (ref 0.0–0.5)
Eosinophils Relative: 1 %
HCT: 41.5 % (ref 39.0–52.0)
Hemoglobin: 14.2 g/dL (ref 13.0–17.0)
Immature Granulocytes: 0 %
Lymphocytes Relative: 11 %
Lymphs Abs: 0.9 10*3/uL (ref 0.7–4.0)
MCH: 30.6 pg (ref 26.0–34.0)
MCHC: 34.2 g/dL (ref 30.0–36.0)
MCV: 89.4 fL (ref 80.0–100.0)
Monocytes Absolute: 0.5 10*3/uL (ref 0.1–1.0)
Monocytes Relative: 6 %
Neutro Abs: 6.4 10*3/uL (ref 1.7–7.7)
Neutrophils Relative %: 82 %
Platelet Count: 141 10*3/uL — ABNORMAL LOW (ref 150–400)
RBC: 4.64 MIL/uL (ref 4.22–5.81)
RDW: 13.3 % (ref 11.5–15.5)
WBC Count: 7.9 10*3/uL (ref 4.0–10.5)
nRBC: 0 % (ref 0.0–0.2)

## 2019-04-21 LAB — TOTAL PROTEIN, URINE DIPSTICK: Protein, ur: NEGATIVE mg/dL

## 2019-04-21 LAB — CMP (CANCER CENTER ONLY)
ALT: 44 U/L (ref 0–44)
AST: 42 U/L — ABNORMAL HIGH (ref 15–41)
Albumin: 3.9 g/dL (ref 3.5–5.0)
Alkaline Phosphatase: 72 U/L (ref 38–126)
Anion gap: 10 (ref 5–15)
BUN: 11 mg/dL (ref 6–20)
CO2: 23 mmol/L (ref 22–32)
Calcium: 9 mg/dL (ref 8.9–10.3)
Chloride: 111 mmol/L (ref 98–111)
Creatinine: 1.21 mg/dL (ref 0.61–1.24)
GFR, Est AFR Am: >60 mL/min (ref >60)
GFR, Estimated: >60 mL/min (ref >60)
Glucose, Bld: 95 mg/dL (ref 70–99)
Potassium: 3.8 mmol/L (ref 3.5–5.1)
Sodium: 144 mmol/L (ref 135–145)
Total Bilirubin: 0.5 mg/dL (ref 0.3–1.2)
Total Protein: 6.6 g/dL (ref 6.5–8.1)

## 2019-04-21 MED ORDER — SODIUM CHLORIDE 0.9 % IV SOLN
Freq: Once | INTRAVENOUS | Status: AC
  Filled 2019-04-21: qty 250

## 2019-04-21 MED ORDER — TEMOZOLOMIDE 100 MG PO CAPS: 200.0000 mg/m2/d | ORAL_CAPSULE | Freq: Every day | ORAL | 0 refills | Status: DC

## 2019-04-21 MED ORDER — SODIUM CHLORIDE 0.9 % IV SOLN
10.0000 mg/kg | Freq: Once | INTRAVENOUS | Status: AC
  Administered 2019-04-21: 900 mg via INTRAVENOUS
  Filled 2019-04-21: qty 32

## 2019-04-21 MED FILL — TEMOZOLOMIDE 100 MG CAPS: 100 | 5 days supply | Qty: 20 | Fill #0

## 2019-04-21 NOTE — Patient Instructions (Signed)
[facility] Discharge Instructions for Patients Receiving Chemotherapy  Today you received the following chemotherapy agents: bevacizumab  To help prevent nausea and vomiting after your treatment, we encourage you to take your nausea medication as directed.   If you develop nausea and vomiting that is not controlled by your nausea medication, call the clinic.   BELOW ARE SYMPTOMS THAT SHOULD BE REPORTED IMMEDIATELY:  *FEVER GREATER THAN 100.5 F  *CHILLS WITH OR WITHOUT FEVER  NAUSEA AND VOMITING THAT IS NOT CONTROLLED WITH YOUR NAUSEA MEDICATION  *UNUSUAL SHORTNESS OF BREATH  *UNUSUAL BRUISING OR BLEEDING  TENDERNESS IN MOUTH AND THROAT WITH OR WITHOUT PRESENCE OF ULCERS  *URINARY PROBLEMS  *BOWEL PROBLEMS  UNUSUAL RASH Items with * indicate a potential emergency and should be followed up as soon as possible.  Feel free to call the clinic should you have any questions or concerns. The clinic phone number is [phone].  Please show the CHEMO ALERT CARD at check-in to the Emergency Department and triage nurse.   

## 2019-04-21 NOTE — Progress Notes (Signed)
[facility] at [place], [place] [address] [phone]   Interval Evaluation  Date of Service: 04/21/19 Patient Name: Cody [name] Patient MRN: [id number] Patient DOB: [birth year] Provider: Ventura Sellers, MD  Identifying Statement:  Cody [name] is a 59 y.o. male with left occipital glioblastoma   Oncologic History: Oncology History  Glioblastoma ([place])  09/29/2018 Surgery   Craniotomy, resection with Dr. Kathyrn Sheriff   10/21/2018 - 10/31/2018 Chemotherapy   The patient had [No matching medication found in this treatment plan]  for chemotherapy treatment.    01/09/2019 -  Chemotherapy   The patient had [No matching medication found in this treatment plan]  for chemotherapy treatment.    01/23/2019 -  Chemotherapy   The patient had bevacizumab-bvzr (ZIRABEV) 800 mg in sodium chloride 0.9 % 100 mL chemo infusion, 10 mg/kg = 800 mg, Intravenous,  Once, 6 of 6 cycles Administration: 800 mg (01/23/2019), 800 mg (02/09/2019), 900 mg (02/24/2019), 900 mg (03/10/2019), 900 mg (03/24/2019), 900 mg (04/07/2019)  for chemotherapy treatment.      Biomarkers:  MGMT Unknown.  IDH 1/2 Wild type.  EGFR Unknown  TERT Unknown   Interval History:  Cody [name] presents today for avastin infusion and temodar pre-treat.  He and his wife describe no new or progressive neurologic defcits.  No recurrence of headaches. Vision on the right side is still impaired as prior. He maintains full functional status otherwise.  No issues with gait or motor control. He has had no recurrent seizure events.  Continues on 18m daily decadron.   H+P (10/18/18) Patient presented to medical attention in August 2020 with several weeks history of new onset headaches.  Progressive nature of symptoms led to an ED admission, where CNS imaging demonstrated enhancing mass in the left occipital lobe.  He underwent craniotomy and resection with Dr. [name]  85/05/69without complication.  He was discharged to home with small visual field impairment and otherwise functionally intact.  He denies headaches or any seizures since surgery, having completed decadron taper.    Medications: Current Outpatient Medications on File Prior to Visit  Medication Sig Dispense Refill  . dexamethasone (DECADRON) 1 MG tablet Take 2 tablets (2 mg total) by mouth daily. (Patient taking differently: Take 1 mg by mouth daily. ) 60 tablet 1  . finasteride (PROSCAR) 5 MG tablet Take 5 mg by mouth daily.    .Marland KitchenlevETIRAcetam (KEPPRA) 500 MG tablet TAKE 2 TABLETS(1000 MG) BY MOUTH TWICE DAILY 60 tablet 3  . ondansetron (ZOFRAN) 8 MG tablet Take 1 tablet (8 mg total) by mouth 2 (two) times daily as needed (nausea and vomiting). May take 30-60 minutes prior to Temodar administration if nausea/vomiting occurs. (Patient not taking: Reported on 02/09/2019) 30 tablet 1  . tamsulosin (FLOMAX) 0.4 MG CAPS capsule Take 0.4 mg by mouth daily.    .Marland Kitchentemozolomide (TEMODAR) 100 MG capsule Take 3 capsules (300 mg total) by mouth daily. May take on an empty stomach to decrease nausea & vomiting. 15 capsule 0  . traMADol (ULTRAM) 50 MG tablet TAKE 1 TABLET(50 MG) BY MOUTH EVERY 6 HOURS AS NEEDED FOR SEVERE PAIN 30 tablet 1   No current facility-administered medications on file prior to visit.    Allergies: No Known Allergies Past Medical History:  Past Medical History:  Diagnosis Date  . Enlarged prostate   . Migraines    Past Surgical History:  Past Surgical History:  Procedure Laterality  Date  . APPLICATION OF CRANIAL NAVIGATION Left 09/29/2018   Procedure: APPLICATION OF CRANIAL NAVIGATION;  Surgeon: [name], MD;  Location: [facility];  Service: Neurosurgery;  Laterality: Left;  . CRANIOTOMY Left 09/29/2018   Procedure: Sterotactic Left craniotomy for resection of tumor;  Surgeon: [name], MD;  Location: [facility];  Service: Neurosurgery;  Laterality: Left;  Sterotactic Left  craniotomy for resection of tumor   Social History:  Social History   Socioeconomic History  . Marital status: Married    Spouse name: Not on file  . Number of children: Not on file  . Years of education: Not on file  . Highest education level: Not on file  Occupational History  . Not on file  Tobacco Use  . Smoking status: Never Smoker  . Smokeless tobacco: Never Used  Substance and Sexual Activity  . Alcohol use: Never  . Drug use: Never  . Sexual activity: Not on file  Other Topics Concern  . Not on file  Social History Narrative  . Not on file   Social Determinants of Health   Financial Resource Strain:   . Difficulty of Paying Living Expenses:   Food Insecurity:   . Worried About Charity fundraiser in the Last Year:   . Arboriculturist in the Last Year:   Transportation Needs: Unmet Transportation Needs  . Lack of Transportation (Medical): Yes  . Lack of Transportation (Non-Medical): No  Physical Activity:   . Days of Exercise per Week:   . Minutes of Exercise per Session:   Stress:   . Feeling of Stress :   Social Connections:   . Frequency of Communication with Friends and Family:   . Frequency of Social Gatherings with Friends and Family:   . Attends Religious Services:   . Active Member of Clubs or Organizations:   . Attends Archivist Meetings:   [name] Marital Status:   Intimate Partner Violence: Not At Risk  . Fear of Current or Ex-Partner: No  . Emotionally Abused: No  . Physically Abused: No  . Sexually Abused: No   Family History: No family history on file.  Review of Systems: Constitutional: Denies fevers, chills or abnormal weight loss Eyes: Denies blurriness of vision Ears, nose, mouth, throat, and face: Denies mucositis or sore throat Respiratory: Denies cough, dyspnea or wheezes Cardiovascular: Denies palpitation, chest discomfort or lower extremity swelling Gastrointestinal:  Denies nausea, constipation, diarrhea GU: Denies  dysuria or incontinence Skin: Denies abnormal skin rashes Neurological: Per HPI Musculoskeletal: Denies joint pain, back or neck discomfort. No decrease in ROM Behavioral/Psych: Denies anxiety, disturbance in thought content, and mood instability  Physical Exam: Vitals:   04/21/19 1014  BP: (!) 155/93  Pulse: 60  Resp: 17  Temp: 98.9 F (37.2 C)  SpO2: 100%   KPS: 80. General: Alert, cooperative, pleasant, in no acute distress Head: Craniotomy scar noted, dry and intact. EENT: No conjunctival injection or scleral icterus. Oral mucosa moist Lungs: Resp effort normal Cardiac: Regular rate and rhythm Abdomen: Soft, non-distended abdomen Skin: No rashes cyanosis or petechiae. Extremities: No clubbing or edema  Neurologic Exam: Mental Status: Awake, alert, attentive to examiner. Oriented to self and environment. Subtle mixed dysphasia motor>sensory. Cranial Nerves: Visual acuity is grossly normal. Right homonymous hemianopia. Extra-ocular movements intact. No ptosis. Face is symmetric, tongue midline. Motor: Tone and bulk are normal. Power is full in both arms and legs. Reflexes are symmetric, no pathologic reflexes present. Intact finger to nose  bilaterally Sensory: Intact to light touch and temperature Gait: Deferred  Labs: I have reviewed the data as listed    Component Value Date/Time   NA 142 04/07/2019 1022   K 3.7 04/07/2019 1022   CL 109 04/07/2019 1022   CO2 24 04/07/2019 1022   GLUCOSE 90 04/07/2019 1022   BUN 8 04/07/2019 1022   CREATININE 1.14 04/07/2019 1022   CALCIUM 8.6 (L) 04/07/2019 1022   PROT 6.5 04/07/2019 1022   ALBUMIN 3.7 04/07/2019 1022   AST 31 04/07/2019 1022   ALT 57 (H) 04/07/2019 1022   ALKPHOS 79 04/07/2019 1022   BILITOT 0.6 04/07/2019 1022   GFRNONAA >60 04/07/2019 1022   GFRAA >60 04/07/2019 1022   Lab Results  Component Value Date   WBC 7.9 04/21/2019   NEUTROABS 6.4 04/21/2019   HGB 14.2 04/21/2019   HCT 41.5 04/21/2019   MCV  89.4 04/21/2019   PLT 141 (L) 04/21/2019    Assessment/Plan Glioblastoma ([place])  Focal seizures ([facility])  Cody [name] is clinically stable today. Labs are WNL.    He is cleared today for Avastin infusion, 51m/kg IV q2 weeks.    We recommended continuing treatment with cycle #4 Temozolomide 2043mm2, on for five days and off for twenty three days in twenty eight day cycles. The patient will have a complete blood count performed on days 21 and 28 of each cycle, and a comprehensive metabolic panel performed on day 28 of each cycle. Labs may need to be performed more often. Zofran will prescribed for home use for nausea/vomiting.   Avastin should be held for the following:  ANC less than 500  Platelets less than 50,000  LFT or creatinine greater than 2x ULN  If clinical concerns/contraindications develop  Chemotherapy should be held for the following:  ANC less than 1,000  Platelets less than 100,000  LFT or creatinine greater than 2x ULN  If clinical concerns/contraindications develop  We again recommended discontinuing decadron, or decreasing to 0.27m427maily, once headaches are improved.  He may benefit from hydrocortisone bridge if this is ineffectual.  We appreciate the opportunity to participate in the care of Cody [name]  He will return to clinic in 2 weeks for avastin infusion.  Next MRI in 4 weeks.  All questions were answered. The patient knows to call the clinic with any problems, questions or concerns. No barriers to learning were detected.  The total time spent in the encounter was 30 minutes and more than 50% was on counseling and review of test results   ZacVentura SellersD Medical Director of Neuro-Oncology [facility] [place]/19/21 10:18 AM

## 2019-04-24 ENCOUNTER — Telehealth: Payer: Self-pay | Admitting: Internal Medicine

## 2019-04-24 NOTE — Telephone Encounter (Signed)
Scheduled appt per 3/19 los.  Spoke with pt and he is aware of the appt date and time

## 2019-05-01 NOTE — Progress Notes (Signed)

## 2019-05-03 ENCOUNTER — Other Ambulatory Visit: Payer: Self-pay | Admitting: Radiation Therapy

## 2019-05-05 ENCOUNTER — Inpatient Hospital Stay (HOSPITAL_BASED_OUTPATIENT_CLINIC_OR_DEPARTMENT_OTHER): Payer: BC Managed Care – PPO | Admitting: Internal Medicine

## 2019-05-05 ENCOUNTER — Inpatient Hospital Stay: Payer: BC Managed Care – PPO

## 2019-05-05 ENCOUNTER — Inpatient Hospital Stay: Payer: BC Managed Care – PPO | Attending: Internal Medicine

## 2019-05-05 ENCOUNTER — Other Ambulatory Visit: Payer: Self-pay | Admitting: Internal Medicine

## 2019-05-05 ENCOUNTER — Other Ambulatory Visit: Payer: Self-pay

## 2019-05-05 VITALS — BP 146/87 | HR 62 | Temp 98.5°F | Resp 18 | Ht 72.0 in | Wt 205.0 lb

## 2019-05-05 DIAGNOSIS — C714 Malignant neoplasm of occipital lobe: Secondary | ICD-10-CM | POA: Insufficient documentation

## 2019-05-05 DIAGNOSIS — C719 Malignant neoplasm of brain, unspecified: Secondary | ICD-10-CM | POA: Diagnosis not present

## 2019-05-05 DIAGNOSIS — Z5112 Encounter for antineoplastic immunotherapy: Secondary | ICD-10-CM | POA: Insufficient documentation

## 2019-05-05 DIAGNOSIS — N400 Enlarged prostate without lower urinary tract symptoms: Secondary | ICD-10-CM | POA: Diagnosis not present

## 2019-05-05 DIAGNOSIS — Z9221 Personal history of antineoplastic chemotherapy: Secondary | ICD-10-CM | POA: Diagnosis not present

## 2019-05-05 DIAGNOSIS — R112 Nausea with vomiting, unspecified: Secondary | ICD-10-CM | POA: Insufficient documentation

## 2019-05-05 DIAGNOSIS — Z79899 Other long term (current) drug therapy: Secondary | ICD-10-CM | POA: Diagnosis not present

## 2019-05-05 DIAGNOSIS — R634 Abnormal weight loss: Secondary | ICD-10-CM | POA: Insufficient documentation

## 2019-05-05 LAB — CBC WITH DIFFERENTIAL (CANCER CENTER ONLY)
Abs Immature Granulocytes: 0.02 10*3/uL (ref 0.00–0.07)
Basophils Absolute: 0 10*3/uL (ref 0.0–0.1)
Basophils Relative: 0 %
Eosinophils Absolute: 0 10*3/uL (ref 0.0–0.5)
Eosinophils Relative: 0 %
HCT: 41.8 % (ref 39.0–52.0)
Hemoglobin: 14.3 g/dL (ref 13.0–17.0)
Immature Granulocytes: 0 %
Lymphocytes Relative: 22 %
Lymphs Abs: 1.2 10*3/uL (ref 0.7–4.0)
MCH: 30.6 pg (ref 26.0–34.0)
MCHC: 34.2 g/dL (ref 30.0–36.0)
MCV: 89.5 fL (ref 80.0–100.0)
Monocytes Absolute: 0.4 10*3/uL (ref 0.1–1.0)
Monocytes Relative: 8 %
Neutro Abs: 3.6 10*3/uL (ref 1.7–7.7)
Neutrophils Relative %: 70 %
Platelet Count: 144 10*3/uL — ABNORMAL LOW (ref 150–400)
RBC: 4.67 MIL/uL (ref 4.22–5.81)
RDW: 13 % (ref 11.5–15.5)
WBC Count: 5.2 10*3/uL (ref 4.0–10.5)
nRBC: 0 % (ref 0.0–0.2)

## 2019-05-05 LAB — CMP (CANCER CENTER ONLY)
ALT: 52 U/L — ABNORMAL HIGH (ref 0–44)
AST: 29 U/L (ref 15–41)
Albumin: 3.8 g/dL (ref 3.5–5.0)
Alkaline Phosphatase: 77 U/L (ref 38–126)
Anion gap: 12 (ref 5–15)
BUN: 8 mg/dL (ref 6–20)
CO2: 22 mmol/L (ref 22–32)
Calcium: 9.3 mg/dL (ref 8.9–10.3)
Chloride: 110 mmol/L (ref 98–111)
Creatinine: 1.4 mg/dL — ABNORMAL HIGH (ref 0.61–1.24)
GFR, Est AFR Am: >60 mL/min (ref >60)
GFR, Estimated: 56 mL/min — ABNORMAL LOW (ref >60)
Glucose, Bld: 103 mg/dL — ABNORMAL HIGH (ref 70–99)
Potassium: 4 mmol/L (ref 3.5–5.1)
Sodium: 144 mmol/L (ref 135–145)
Total Bilirubin: 0.4 mg/dL (ref 0.3–1.2)
Total Protein: 6.5 g/dL (ref 6.5–8.1)

## 2019-05-05 LAB — TOTAL PROTEIN, URINE DIPSTICK: Protein, ur: NEGATIVE mg/dL

## 2019-05-05 MED ORDER — SODIUM CHLORIDE 0.9 % IV SOLN
Freq: Once | INTRAVENOUS | Status: AC
  Filled 2019-05-05: qty 250

## 2019-05-05 MED ORDER — SODIUM CHLORIDE 0.9% FLUSH
10.0000 mL | INTRAVENOUS | Status: DC | PRN
  Filled 2019-05-05: qty 10

## 2019-05-05 MED ORDER — SODIUM CHLORIDE 0.9 % IV SOLN
10.0000 mg/kg | Freq: Once | INTRAVENOUS | Status: AC
  Administered 2019-05-05: 900 mg via INTRAVENOUS
  Filled 2019-05-05: qty 32

## 2019-05-05 MED ORDER — HEPARIN SOD (PORK) LOCK FLUSH 100 UNIT/ML IV SOLN
500.0000 [IU] | Freq: Once | INTRAVENOUS | Status: DC | PRN
  Filled 2019-05-05: qty 5

## 2019-05-05 MED ORDER — HYDROCORTISONE 10 MG PO TABS: ORAL_TABLET | ORAL | 0 refills | Status: DC

## 2019-05-05 NOTE — Progress Notes (Signed)
[facility] [place], [place] [address] [phone]   Interval Evaluation  Date of Service: 05/05/19 Patient Name: Cody [name] Patient MRN: [id number] Patient DOB: 12-Jul-1960 Provider: Ventura Sellers, MD  Identifying Statement:  Cody [name] is a 59 y.o. male with left occipital glioblastoma   Oncologic History: Oncology History  Glioblastoma ([facility])  09/29/2018 Surgery   Craniotomy, resection with Dr. Kathyrn Sheriff   10/21/2018 - 10/31/2018 Chemotherapy   The patient had [No matching medication found in this treatment plan]  for chemotherapy treatment.    01/09/2019 -  Chemotherapy   The patient had [No matching medication found in this treatment plan]  for chemotherapy treatment.    01/23/2019 -  Chemotherapy   The patient had bevacizumab-bvzr (ZIRABEV) 800 mg in sodium chloride 0.9 % 100 mL chemo infusion, 10 mg/kg = 800 mg, Intravenous,  Once, 7 of 9 cycles Administration: 800 mg (01/23/2019), 800 mg (02/09/2019), 900 mg (02/24/2019), 900 mg (03/10/2019), 900 mg (03/24/2019), 900 mg (04/07/2019), 900 mg (04/21/2019)  for chemotherapy treatment.      Biomarkers:  MGMT Unknown.  IDH 1/2 Wild type.  EGFR Unknown  TERT Unknown   Interval History:  Cody [name] presents today for avastin infusion.  He and his wife describe no new or progressive neurologic defcits.  No recurrence of headaches. Vision on the right side is still impaired as prior. He maintains full functional status otherwise.  No issues with gait or motor control. He has had no recurrent seizure events.  Continues on 0.73m daily decadron, tried to stop this week but had recurrent headaches.   H+P (10/18/18) Patient presented to medical attention in August 2020 with several weeks history of new onset headaches.  Progressive nature of symptoms led to an ED admission, where CNS imaging demonstrated enhancing mass in the left occipital lobe.  He underwent  craniotomy and resection with Dr. [name] 89/21/19without complication.  He was discharged to home with small visual field impairment and otherwise functionally intact.  He denies headaches or any seizures since surgery, having completed decadron taper.    Medications: Current Outpatient Medications on File Prior to Visit  Medication Sig Dispense Refill  . dexamethasone (DECADRON) 1 MG tablet Take 2 tablets (2 mg total) by mouth daily. (Patient taking differently: Take 1 mg by mouth daily. ) 60 tablet 1  . finasteride (PROSCAR) 5 MG tablet Take 5 mg by mouth daily.    .Marland KitchenlevETIRAcetam (KEPPRA) 500 MG tablet TAKE 2 TABLETS(1000 MG) BY MOUTH TWICE DAILY 60 tablet 3  . ondansetron (ZOFRAN) 8 MG tablet Take 1 tablet (8 mg total) by mouth 2 (two) times daily as needed (nausea and vomiting). May take 30-60 minutes prior to Temodar administration if nausea/vomiting occurs. (Patient not taking: Reported on 02/09/2019) 30 tablet 1  . tamsulosin (FLOMAX) 0.4 MG CAPS capsule Take 0.4 mg by mouth daily.    .Marland Kitchentemozolomide (TEMODAR) 100 MG capsule Take 3 capsules (300 mg total) by mouth daily. May take on an empty stomach to decrease nausea & vomiting. 15 capsule 0  . temozolomide (TEMODAR) 100 MG capsule Take 4 capsules (400 mg total) by mouth daily. May take on an empty stomach to decrease nausea & vomiting. 20 capsule 0  . traMADol (ULTRAM) 50 MG tablet TAKE 1 TABLET(50 MG) BY MOUTH EVERY 6 HOURS AS NEEDED FOR SEVERE PAIN 30 tablet 1   No current facility-administered medications on file prior to  visit.    Allergies: No Known Allergies Past Medical History:  Past Medical History:  Diagnosis Date  . Enlarged prostate   . Migraines    Past Surgical History:  Past Surgical History:  Procedure Laterality Date  . APPLICATION OF CRANIAL NAVIGATION Left 09/29/2018   Procedure: APPLICATION OF CRANIAL NAVIGATION;  Surgeon: [name], MD;  Location: [facility];  Service: Neurosurgery;  Laterality: Left;    . CRANIOTOMY Left 09/29/2018   Procedure: Sterotactic Left craniotomy for resection of tumor;  Surgeon: [name], MD;  Location: [facility];  Service: Neurosurgery;  Laterality: Left;  Sterotactic Left craniotomy for resection of tumor   Social History:  Social History   Socioeconomic History  . Marital status: Married    Spouse name: Not on file  . Number of children: Not on file  . Years of education: Not on file  . Highest education level: Not on file  Occupational History  . Not on file  Tobacco Use  . Smoking status: Never Smoker  . Smokeless tobacco: Never Used  Substance and Sexual Activity  . Alcohol use: Never  . Drug use: Never  . Sexual activity: Not on file  Other Topics Concern  . Not on file  Social History Narrative  . Not on file   Social Determinants of Health   Financial Resource Strain:   . Difficulty of Paying Living Expenses:   Food Insecurity:   . Worried About Charity fundraiser in the Last Year:   . Arboriculturist in the Last Year:   Transportation Needs: Unmet Transportation Needs  . Lack of Transportation (Medical): Yes  . Lack of Transportation (Non-Medical): No  Physical Activity:   . Days of Exercise per Week:   . Minutes of Exercise per Session:   Stress:   . Feeling of Stress :   Social Connections:   . Frequency of Communication with Friends and Family:   . Frequency of Social Gatherings with Friends and Family:   . Attends Religious Services:   . Active Member of Clubs or Organizations:   . Attends Archivist Meetings:   [name] Marital Status:   Intimate Partner Violence: Not At Risk  . Fear of Current or Ex-Partner: No  . Emotionally Abused: No  . Physically Abused: No  . Sexually Abused: No   Family History: No family history on file.  Review of Systems: Constitutional: Denies fevers, chills or abnormal weight loss Eyes: Denies blurriness of vision Ears, nose, mouth, throat, and face: Denies mucositis or sore  throat Respiratory: Denies cough, dyspnea or wheezes Cardiovascular: Denies palpitation, chest discomfort or lower extremity swelling Gastrointestinal:  Denies nausea, constipation, diarrhea GU: Denies dysuria or incontinence Skin: Denies abnormal skin rashes Neurological: Per HPI Musculoskeletal: Denies joint pain, back or neck discomfort. No decrease in ROM Behavioral/Psych: Denies anxiety, disturbance in thought content, and mood instability  Physical Exam: Vitals:   05/05/19 1018  BP: (!) 146/87  Pulse: 62  Resp: 18  Temp: 98.5 F (36.9 C)  SpO2: 100%   KPS: 80. General: Alert, cooperative, pleasant, in no acute distress Head: Craniotomy scar noted, dry and intact. EENT: No conjunctival injection or scleral icterus. Oral mucosa moist Lungs: Resp effort normal Cardiac: Regular rate and rhythm Abdomen: Soft, non-distended abdomen Skin: No rashes cyanosis or petechiae. Extremities: No clubbing or edema  Neurologic Exam: Mental Status: Awake, alert, attentive to examiner. Oriented to self and environment. Subtle mixed dysphasia motor>sensory. Cranial Nerves: Visual acuity is  grossly normal. Right homonymous hemianopia. Extra-ocular movements intact. No ptosis. Face is symmetric, tongue midline. Motor: Tone and bulk are normal. Power is full in both arms and legs. Reflexes are symmetric, no pathologic reflexes present. Intact finger to nose bilaterally Sensory: Intact to light touch and temperature Gait: Deferred  Labs: I have reviewed the data as listed    Component Value Date/Time   NA 144 04/21/2019 1000   K 3.8 04/21/2019 1000   CL 111 04/21/2019 1000   CO2 23 04/21/2019 1000   GLUCOSE 95 04/21/2019 1000   BUN 11 04/21/2019 1000   CREATININE 1.21 04/21/2019 1000   CALCIUM 9.0 04/21/2019 1000   PROT 6.6 04/21/2019 1000   ALBUMIN 3.9 04/21/2019 1000   AST 42 (H) 04/21/2019 1000   ALT 44 04/21/2019 1000   ALKPHOS 72 04/21/2019 1000   BILITOT 0.5 04/21/2019 1000     GFRNONAA >60 04/21/2019 1000   GFRAA >60 04/21/2019 1000   Lab Results  Component Value Date   WBC 5.2 05/05/2019   NEUTROABS 3.6 05/05/2019   HGB 14.3 05/05/2019   HCT 41.8 05/05/2019   MCV 89.5 05/05/2019   PLT 144 (L) 05/05/2019    Assessment/Plan No diagnosis found.  Mr. Esper is clinically stable today. Labs are WNL.    He is cleared today for Avastin infusion, 9m/kg IV q2 weeks.    We recommended continuing treatment with cycle #4 Temozolomide 2019mm2, on for five days and off for twenty three days in twenty eight day cycles. The patient will have a complete blood count performed on days 21 and 28 of each cycle, and a comprehensive metabolic panel performed on day 28 of each cycle. Labs may need to be performed more often. Zofran will prescribed for home use for nausea/vomiting.   Currently on day 15/28 of cycle #4.  Avastin should be held for the following:  ANC less than 500  Platelets less than 50,000  LFT or creatinine greater than 2x ULN  If clinical concerns/contraindications develop  Chemotherapy should be held for the following:  ANC less than 1,000  Platelets less than 100,000  LFT or creatinine greater than 2x ULN  If clinical concerns/contraindications develop  For corticosteroids, will recommend hydrocortisone bridge 2011maily x7 days, then 32m38mily x7 days, then stop.  Decadron can be discontinued in 3-4 days.  We appreciate the opportunity to participate in the care of Cody [name] He will return to clinic in 3 weeks following MRI brain for evaluation.  All questions were answered. The patient knows to call the clinic with any problems, questions or concerns. No barriers to learning were detected.  The total time spent in the encounter was 30 minutes and more than 50% was on counseling and review of test results   ZachVentura Sellers Medical Director of Neuro-Oncology [facility]02/21  10:20 AM

## 2019-05-05 NOTE — Patient Instructions (Signed)
[facility] Discharge Instructions for Patients Receiving Chemotherapy  Today you received the following chemotherapy agents Bevacizumab  To help prevent nausea and vomiting after your treatment, we encourage you to take your nausea medication as directed   If you develop nausea and vomiting that is not controlled by your nausea medication, call the clinic.   BELOW ARE SYMPTOMS THAT SHOULD BE REPORTED IMMEDIATELY:  *FEVER GREATER THAN 100.5 F  *CHILLS WITH OR WITHOUT FEVER  NAUSEA AND VOMITING THAT IS NOT CONTROLLED WITH YOUR NAUSEA MEDICATION  *UNUSUAL SHORTNESS OF BREATH  *UNUSUAL BRUISING OR BLEEDING  TENDERNESS IN MOUTH AND THROAT WITH OR WITHOUT PRESENCE OF ULCERS  *URINARY PROBLEMS  *BOWEL PROBLEMS  UNUSUAL RASH Items with * indicate a potential emergency and should be followed up as soon as possible.  Feel free to call the clinic should you have any questions or concerns. The clinic phone number is [phone].  Please show the CHEMO ALERT CARD at check-in to the Emergency Department and triage nurse.   

## 2019-05-08 ENCOUNTER — Other Ambulatory Visit: Payer: Self-pay | Admitting: Internal Medicine

## 2019-05-08 ENCOUNTER — Other Ambulatory Visit: Payer: Self-pay | Admitting: *Deleted

## 2019-05-08 DIAGNOSIS — C719 Malignant neoplasm of brain, unspecified: Secondary | ICD-10-CM

## 2019-05-08 MED ORDER — LEVETIRACETAM 500 MG PO TABS: ORAL_TABLET | ORAL | 1 refills | Status: DC

## 2019-05-08 NOTE — Telephone Encounter (Signed)
Please see refill request.

## 2019-05-09 ENCOUNTER — Telehealth: Payer: Self-pay | Admitting: Internal Medicine

## 2019-05-09 NOTE — Telephone Encounter (Signed)
Scheduled per los. Called and spoke with patient. Confirmed appt 

## 2019-05-13 ENCOUNTER — Ambulatory Visit: Payer: BC Managed Care – PPO | Attending: Internal Medicine

## 2019-05-13 DIAGNOSIS — Z23 Encounter for immunization: Secondary | ICD-10-CM

## 2019-05-13 NOTE — Progress Notes (Signed)
   Covid-19 Vaccination Clinic  Name:  Cody [name]    MRN: [id number] DOB: October 26, 1960  05/13/2019  Mr. Cody [name] was observed post Covid-19 immunization for 15 minutes without incident. He was provided with Vaccine Information Sheet and instruction to access the V-Safe system.   Mr. Cody [name] was instructed to call 911 with any severe reactions post vaccine: [name] Difficulty breathing  . Swelling of face and throat  . A fast heartbeat  . A bad rash all over body  . Dizziness and weakness   Immunizations Administered    Name Date Dose VIS Date Route   Pfizer COVID-19 Vaccine 05/13/2019 12:52 PM 0.3 mL 01/13/2019 Intramuscular   Manufacturer: [place]   Lot: [id number]   [place]: [id number]

## 2019-05-25 ENCOUNTER — Telehealth: Payer: Self-pay | Admitting: *Deleted

## 2019-05-25 NOTE — Telephone Encounter (Signed)
Patient called to advise yesterday that his headache was back again.  He had to resort to Tylenol and then Tramadol.  He reports he is not taking any Decadron.    He reports that today the headache is present but manageable.  Will continue to follow today and if he feels like he needs to be seen he will call back.

## 2019-05-27 ENCOUNTER — Other Ambulatory Visit: Payer: Self-pay

## 2019-05-27 ENCOUNTER — Ambulatory Visit
Admission: RE | Admit: 2019-05-27 | Discharge: 2019-05-27 | Disposition: A | Discharge status: Home / Self Care | Payer: BC Managed Care – PPO | Source: Ambulatory Visit | Attending: Internal Medicine | Admitting: Internal Medicine

## 2019-05-27 DIAGNOSIS — C719 Malignant neoplasm of brain, unspecified: Secondary | ICD-10-CM

## 2019-05-27 IMAGING — MR MR HEAD WO/W CM
13 series · 48 of 48 positions shown · IV contrast (18ml multihance)
Comparison: Brain MRI [DATE] and earlier.

CLINICAL DATA: 58-year-old male with left occipital GBM. Status
post resection in [DATE] with chemotherapy and radiation.
Recent Avastin, Temozolomide. Recent steroid tapering.

EXAM:
MRI HEAD WITHOUT AND WITH CONTRAST
TECHNIQUE: Multiplanar, multiecho pulse sequences of the brain and surrounding
structures were obtained without and with intravenous contrast.
CONTRAST:  18mL MULTIHANCE GADOBENATE DIMEGLUMINE 529 MG/ML IV SOLN

[Series 5: T1 · sagittal · 4.0mm · 0.75mm/px · 2 of 31 slices shown (1 of 3)]
[im 1/31]
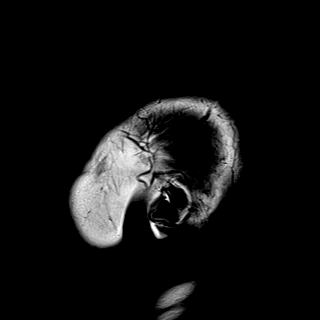
[im 31/31]
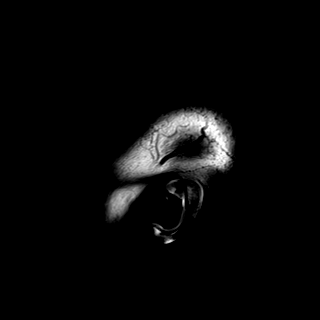

[Series 6: DWI · axial · 3.0mm · 1.44mm/px · z∈[-69,+67]mm · 5 of 84 slices shown (1 of 4)]
[im 1/84]
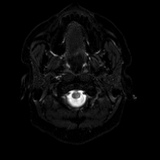
[im 21/84]
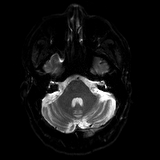
[im 42/84]
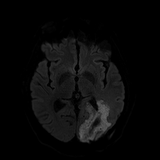
[im 63/84]
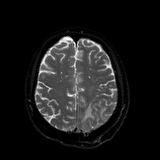
[im 84/84]
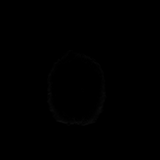

[Series 7: DWI · axial · 3.0mm · 1.44mm/px · z∈[-69,+67]mm · 2 of 42 slices shown (2 of 4)]
[im 1/42]
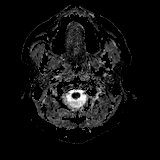
[im 42/42]
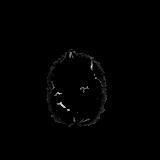

[Series 8: DWI · coronal · 5.0mm · 1.44mm/px · 3 of 59 slices shown (3 of 4)]
[im 1/59]
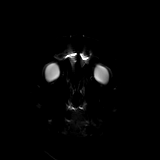
[im 30/59]
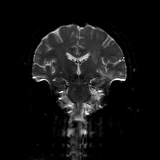
[im 59/59]
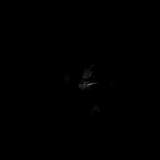

[Series 9: DWI · coronal · 5.0mm · 1.44mm/px · 2 of 30 slices shown (4 of 4)]
[im 1/30]
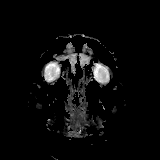
[im 30/30]
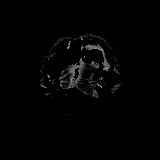

[Series 10: T2 · axial · 4.0mm · 0.36mm/px · z∈[-60,+76]mm · 2 of 27 slices shown]
[im 1/27]
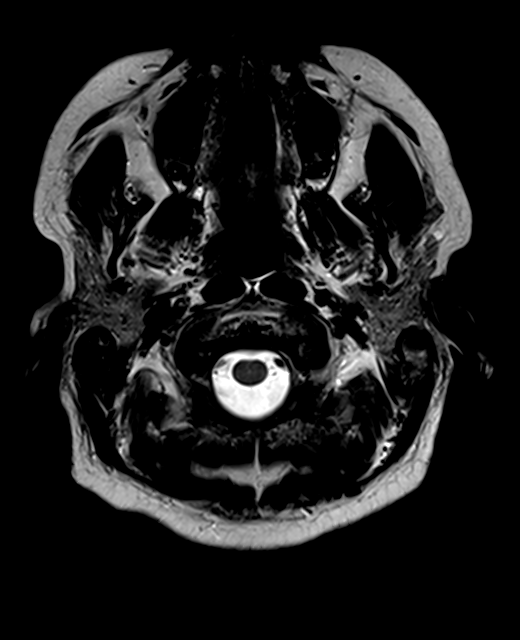
[im 27/27]
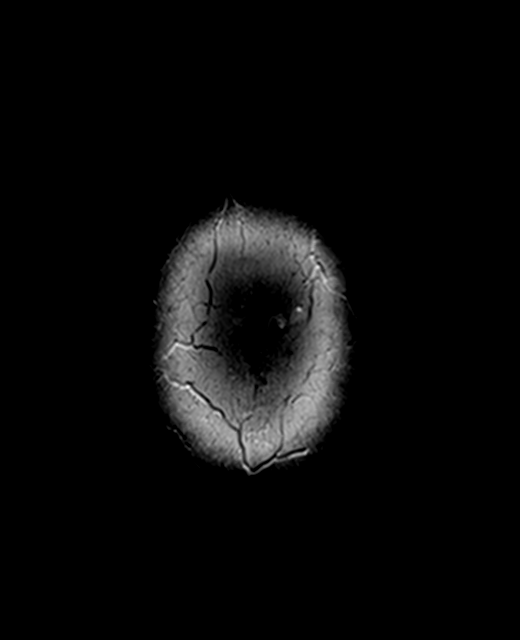

[Series 11: FLAIR · axial · 3.0mm · 0.72mm/px · z∈[-68,+82]mm · 2 of 26 slices shown]
[im 1/26]
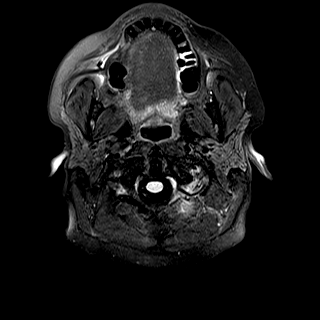
[im 26/26]
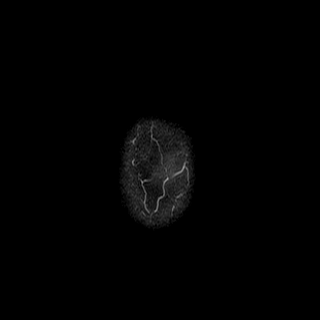

[Series 13: swi_images · axial · 1.5mm · 0.90mm/px · z∈[-63,+79]mm · 6 of 96 slices shown]
[im 1/96]
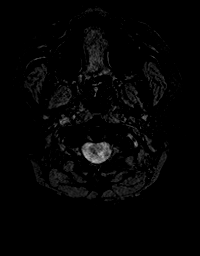
[im 20/96]
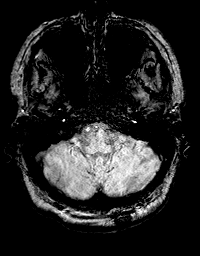
[im 39/96]
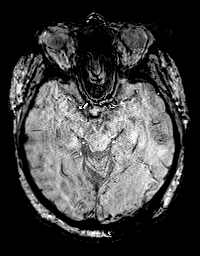
[im 58/96]
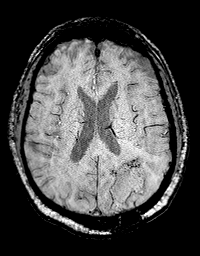
[im 77/96]
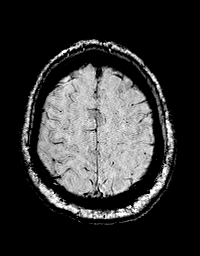
[im 96/96]
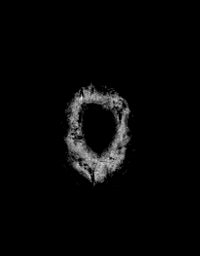

[Series 14: T1 · axial · 1.0mm · 0.94mm/px · z∈[-71,+88]mm · 9 of 160 slices shown (2 of 3)]
[im 1/160]
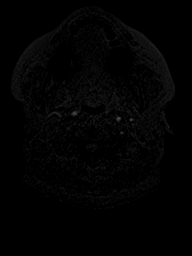
[im 20/160]
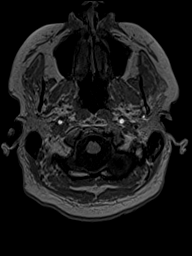
[im 40/160]
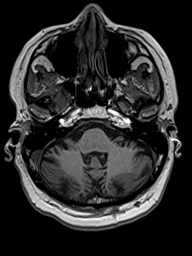
[im 60/160]
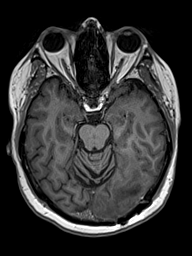
[im 80/160]
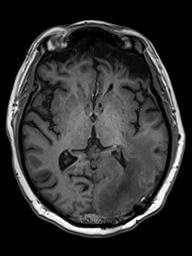
[im 100/160]
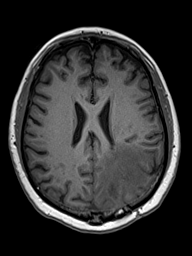
[im 120/160]
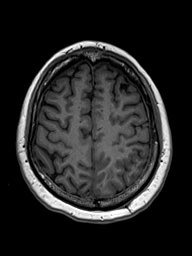
[im 140/160]
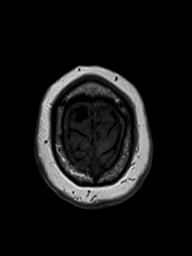
[im 160/160]
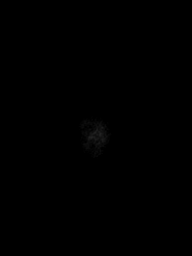

[Series 15: T2 post-contrast · coronal · 4.5mm · 0.36mm/px · 2 of 35 slices shown]
[im 1/35]
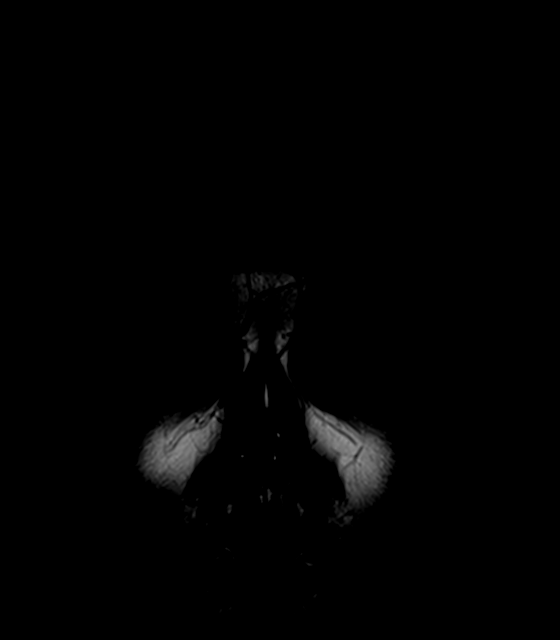
[im 35/35]
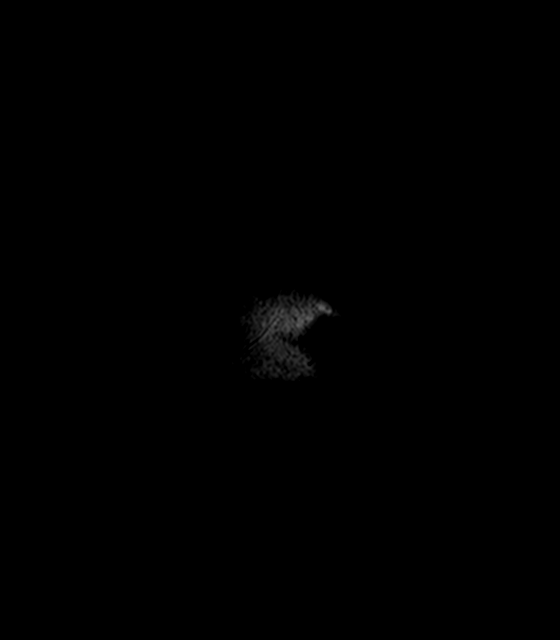

[Series 16: T1 · axial · 1.0mm · 0.94mm/px · z∈[-71,+88]mm · 9 of 160 slices shown (3 of 3)]
[im 1/160]
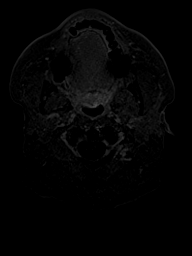
[im 20/160]
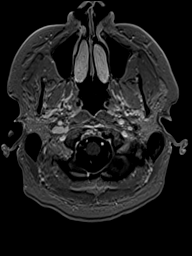
[im 40/160]
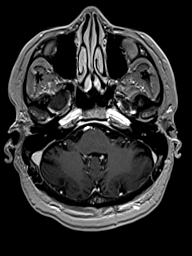
[im 60/160]
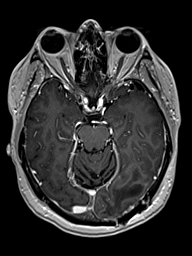
[im 80/160]
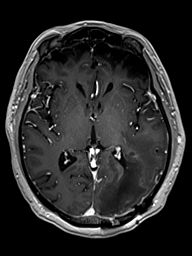
[im 100/160]
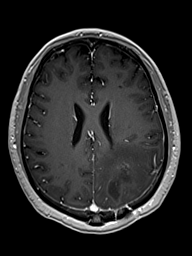
[im 120/160]
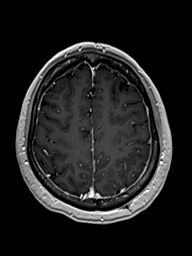
[im 140/160]
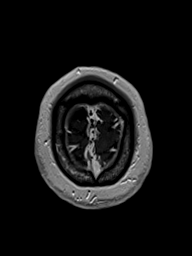
[im 160/160]
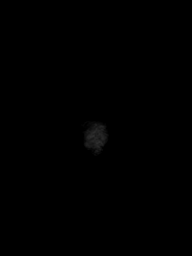

[Series 17: T1 post-contrast · coronal · 4.5mm · 0.72mm/px · 2 of 35 slices shown (1 of 2)]
[im 1/35]
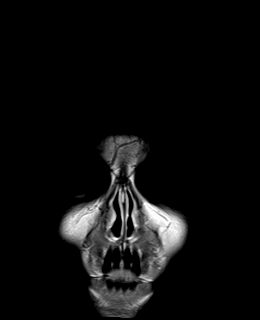
[im 35/35]
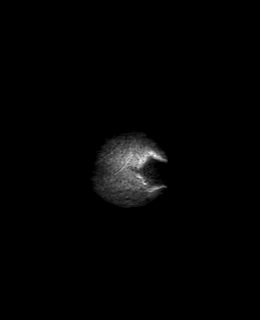

[Series 18: T1 post-contrast · sagittal · 4.0mm · 0.75mm/px · 2 of 31 slices shown (2 of 2)]
[im 1/31]
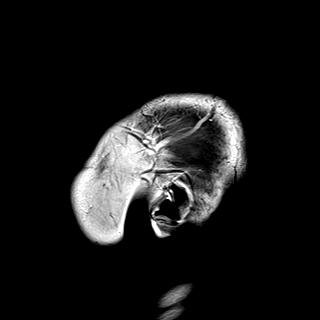
[im 31/31]
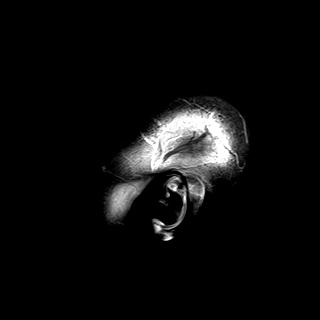

[48 of 48 positions shown; findings below may reference images not displayed]

FINDINGS: Brain: Confluent 6 cm area of restricted diffusion, heterogeneous T2
and FLAIR hyperintensity replacing most of the left occipital lobe.
The anterior extent of abnormal T2 and FLAIR signal has progressed
since [REDACTED], now reaching the posterior deep white matter
capsules on series 11, image 14. Additionally, there is substantial
new T2/FLAIR hyperintensity and abnormal diffusion medial to the
left occipital horn and newly tracking into the splenium of the
corpus callosum. See series 11, image 14 and series 6, image 64).
New mass effect on the left atrium of the lateral ventricle which
previously was capacious. Also abundant increased left parietal lobe
T2 and FLAIR hyperintensity (series 11, image 18). Regional
hemosiderin also appears increased (series 13, image 53).

Following contrast, faint peripheral enhancement along the area of
abnormal diffusion has not significantly changed (series 16, image
84).

No superimposed restricted diffusion suggestive of acute infarction.
No midline shift, ventriculomegaly, extra-axial collection or acute
intracranial hemorrhage. Cervicomedullary junction and pituitary are
within normal limits.

Chronic anterior frontal lobe encephalomalacia is stable. No other
abnormal intracranial enhancement. No dural thickening

Vascular: Major intracranial vascular flow voids are stable. The
major dural venous sinuses are enhancing and appear to be patent.

Skull and upper cervical spine: Stable and negative visible cervical
spine. Stable visible osseous structures and bone marrow signal.

Sinuses/Orbits: Stable and negative.

Other: Mastoids remain clear. Visible internal auditory structures
appear normal. Stable scalp soft tissues.
IMPRESSION: 1. Substantially progressed abnormal T2/FLAIR signal and also
abnormal diffusion since [REDACTED], with new involvement of the
splenium, and new mass effect on the atrium of the left lateral
ventricle. Relatively stable enhancement.
The appearance is most compatible with tumor progression in the
setting of Avastin.

2. No new intracranial abnormality identified.

## 2019-05-27 MED ORDER — GADOBENATE DIMEGLUMINE 529 MG/ML IV SOLN
18.0000 mL | Freq: Once | INTRAVENOUS | Status: AC | PRN
  Administered 2019-05-27: 18 mL via INTRAVENOUS

## 2019-05-29 NOTE — Progress Notes (Signed)
Pharmacist Chemotherapy Monitoring - Follow Up Assessment    I verify that I have reviewed each item in the below checklist:  . Regimen for the patient is scheduled for the appropriate day and plan matches scheduled date. [name] Appropriate non-routine labs are ordered dependent on drug ordered. . If applicable, additional medications reviewed and ordered per protocol based on lifetime cumulative doses and/or treatment regimen.   Plan for follow-up and/or issues identified: No . I-vent associated with next due treatment: No . MD and/or nursing notified: No  Delane Ginger 05/29/2019 10:20 AM

## 2019-05-30 ENCOUNTER — Inpatient Hospital Stay: Payer: BC Managed Care – PPO | Admitting: Internal Medicine

## 2019-05-30 ENCOUNTER — Telehealth: Payer: Self-pay | Admitting: Pharmacist

## 2019-05-30 ENCOUNTER — Telehealth: Payer: Self-pay

## 2019-05-30 ENCOUNTER — Inpatient Hospital Stay: Payer: BC Managed Care – PPO

## 2019-05-30 ENCOUNTER — Other Ambulatory Visit: Payer: Self-pay

## 2019-05-30 VITALS — BP 138/88

## 2019-05-30 VITALS — BP 138/93 | HR 83 | Temp 98.5°F | Resp 18 | Ht 72.0 in | Wt 193.1 lb

## 2019-05-30 DIAGNOSIS — R569 Unspecified convulsions: Secondary | ICD-10-CM

## 2019-05-30 DIAGNOSIS — C714 Malignant neoplasm of occipital lobe: Secondary | ICD-10-CM | POA: Diagnosis not present

## 2019-05-30 DIAGNOSIS — C719 Malignant neoplasm of brain, unspecified: Secondary | ICD-10-CM | POA: Diagnosis not present

## 2019-05-30 DIAGNOSIS — Z7189 Other specified counseling: Secondary | ICD-10-CM

## 2019-05-30 LAB — CBC WITH DIFFERENTIAL (CANCER CENTER ONLY)
Abs Immature Granulocytes: 0.01 10*3/uL (ref 0.00–0.07)
Basophils Absolute: 0 10*3/uL (ref 0.0–0.1)
Basophils Relative: 0 %
Eosinophils Absolute: 0.1 10*3/uL (ref 0.0–0.5)
Eosinophils Relative: 1 %
HCT: 46.8 % (ref 39.0–52.0)
Hemoglobin: 16.3 g/dL (ref 13.0–17.0)
Immature Granulocytes: 0 %
Lymphocytes Relative: 18 %
Lymphs Abs: 1.1 10*3/uL (ref 0.7–4.0)
MCH: 30.2 pg (ref 26.0–34.0)
MCHC: 34.8 g/dL (ref 30.0–36.0)
MCV: 86.7 fL (ref 80.0–100.0)
Monocytes Absolute: 0.4 10*3/uL (ref 0.1–1.0)
Monocytes Relative: 7 %
Neutro Abs: 4.4 10*3/uL (ref 1.7–7.7)
Neutrophils Relative %: 74 %
Platelet Count: 153 10*3/uL (ref 150–400)
RBC: 5.4 MIL/uL (ref 4.22–5.81)
RDW: 12.5 % (ref 11.5–15.5)
WBC Count: 6 10*3/uL (ref 4.0–10.5)
nRBC: 0 % (ref 0.0–0.2)

## 2019-05-30 LAB — CMP (CANCER CENTER ONLY)
ALT: 20 U/L (ref 0–44)
AST: 21 U/L (ref 15–41)
Albumin: 4.2 g/dL (ref 3.5–5.0)
Alkaline Phosphatase: 78 U/L (ref 38–126)
Anion gap: 10 (ref 5–15)
BUN: 8 mg/dL (ref 6–20)
CO2: 23 mmol/L (ref 22–32)
Calcium: 9.3 mg/dL (ref 8.9–10.3)
Chloride: 105 mmol/L (ref 98–111)
Creatinine: 1.37 mg/dL — ABNORMAL HIGH (ref 0.61–1.24)
GFR, Est AFR Am: >60 mL/min (ref >60)
GFR, Estimated: 57 mL/min — ABNORMAL LOW (ref >60)
Glucose, Bld: 106 mg/dL — ABNORMAL HIGH (ref 70–99)
Potassium: 4 mmol/L (ref 3.5–5.1)
Sodium: 138 mmol/L (ref 135–145)
Total Bilirubin: 0.7 mg/dL (ref 0.3–1.2)
Total Protein: 7.2 g/dL (ref 6.5–8.1)

## 2019-05-30 LAB — TOTAL PROTEIN, URINE DIPSTICK: Protein, ur: NEGATIVE mg/dL

## 2019-05-30 MED ORDER — LOMUSTINE 40 MG PO CAPS: 80.0000 mg | ORAL_CAPSULE | Freq: Once | ORAL | 0 refills | Status: AC

## 2019-05-30 MED ORDER — SODIUM CHLORIDE 0.9 % IV SOLN
Freq: Once | INTRAVENOUS | Status: AC
  Filled 2019-05-30: qty 250

## 2019-05-30 MED ORDER — LOMUSTINE 10 MG PO CAPS: 10.0000 mg | ORAL_CAPSULE | Freq: Once | ORAL | 0 refills | Status: AC

## 2019-05-30 MED ORDER — SODIUM CHLORIDE 0.9 % IV SOLN
10.0000 mg/kg | Freq: Once | INTRAVENOUS | Status: AC
  Administered 2019-05-30: 900 mg via INTRAVENOUS
  Filled 2019-05-30: qty 4

## 2019-05-30 MED ORDER — LOMUSTINE 100 MG PO CAPS: 100.0000 mg | ORAL_CAPSULE | Freq: Once | ORAL | 0 refills | Status: AC

## 2019-05-30 NOTE — Progress Notes (Signed)
[facility] [place], [place] [address] [phone]   Interval Evaluation  Date of Service: 05/30/19 Patient Name: Cody [name] Patient MRN: [id number] Patient DOB: 1961/01/06 Provider: Ventura Sellers, MD  Identifying Statement:  Cody [name] is a 59 y.o. male with left occipital glioblastoma   Oncologic History: Oncology History  Glioblastoma ([place])  09/29/2018 Surgery   Craniotomy, resection with Dr. Kathyrn Sheriff   10/21/2018 - 10/31/2018 Chemotherapy   The patient had [No matching medication found in this treatment plan]  for chemotherapy treatment.    01/09/2019 -  Chemotherapy   The patient had [No matching medication found in this treatment plan]  for chemotherapy treatment.    01/23/2019 -  Chemotherapy   The patient had bevacizumab-bvzr (ZIRABEV) 800 mg in sodium chloride 0.9 % 100 mL chemo infusion, 10 mg/kg = 800 mg, Intravenous,  Once, 8 of 9 cycles Administration: 800 mg (01/23/2019), 800 mg (02/09/2019), 900 mg (02/24/2019), 900 mg (03/10/2019), 900 mg (03/24/2019), 900 mg (04/07/2019), 900 mg (04/21/2019), 900 mg (05/05/2019)  for chemotherapy treatment.      Biomarkers:  MGMT Unknown.  IDH 1/2 Wild type.  EGFR Unknown  TERT Unknown   Interval History:  Cody [name] presents today for avastin infusion and MRI review.  He and his wife describe several progressive neurologic deficits today. Headaches have worsened and are now occurring daily.  He has experienced increased word finding difficulty, although maintains mostly fluent speech.  Fatigue and lethargy have been very dramatic the past few weeks, as well as poor appetite and significant weight loss (10-12 lbs in 1 month). Vision on the right side is still impaired as prior. Fortunately no issues with gait or motor control. He has had no recurrent seizure events.  Continues on 57m daily decadron.  H+P (10/18/18) Patient presented to medical attention  in August 2020 with several weeks history of new onset headaches.  Progressive nature of symptoms led to an ED admission, where CNS imaging demonstrated enhancing mass in the left occipital lobe.  He underwent craniotomy and resection with Dr. [name] 80/35/00without complication.  He was discharged to home with small visual field impairment and otherwise functionally intact.  He denies headaches or any seizures since surgery, having completed decadron taper.    Medications: Current Outpatient Medications on File Prior to Visit  Medication Sig Dispense Refill   finasteride (PROSCAR) 5 MG tablet Take 5 mg by mouth daily.     hydrocortisone (CORTEF) 10 MG tablet Take #2 tabs (279m in AM for #7 days, then decrease to #1 tab (1035min AM for 7 days, then stop 21 tablet 0   levETIRAcetam (KEPPRA) 500 MG tablet TAKE 2 TABLETS(1000 MG) BY MOUTH TWICE DAILY 360 tablet 1   tamsulosin (FLOMAX) 0.4 MG CAPS capsule Take 0.4 mg by mouth daily.     traMADol (ULTRAM) 50 MG tablet TAKE 1 TABLET(50 MG) BY MOUTH EVERY 6 HOURS AS NEEDED FOR SEVERE PAIN 30 tablet 1   dexamethasone (DECADRON) 1 MG tablet Take 2 tablets (2 mg total) by mouth daily. (Patient not taking: Reported on 05/30/2019) 60 tablet 1   ondansetron (ZOFRAN) 8 MG tablet Take 1 tablet (8 mg total) by mouth 2 (two) times daily as needed (nausea and vomiting). May take 30-60 minutes prior to Temodar administration if nausea/vomiting occurs. (Patient not taking: Reported on 02/09/2019) 30 tablet 1   temozolomide (TEMODAR) 100 MG capsule Take 3 capsules (300  mg total) by mouth daily. May take on an empty stomach to decrease nausea & vomiting. (Patient not taking: Reported on 05/30/2019) 15 capsule 0   temozolomide (TEMODAR) 100 MG capsule Take 4 capsules (400 mg total) by mouth daily. May take on an empty stomach to decrease nausea & vomiting. (Patient not taking: Reported on 05/30/2019) 20 capsule 0   No current facility-administered medications on  file prior to visit.    Allergies: No Known Allergies Past Medical History:  Past Medical History:  Diagnosis Date   Enlarged prostate    Migraines    Past Surgical History:  Past Surgical History:  Procedure Laterality Date   APPLICATION OF CRANIAL NAVIGATION Left 09/29/2018   Procedure: APPLICATION OF CRANIAL NAVIGATION;  Surgeon: [name], MD;  Location: [employer];  Service: Neurosurgery;  Laterality: Left;   CRANIOTOMY Left 09/29/2018   Procedure: Sterotactic Left craniotomy for resection of tumor;  Surgeon: [name], MD;  Location: [facility];  Service: Neurosurgery;  Laterality: Left;  Sterotactic Left craniotomy for resection of tumor   Social History:  Social History   Socioeconomic History   Marital status: Married    Spouse name: Not on file   Number of children: Not on file   Years of education: Not on file   Highest education level: Not on file  Occupational History   Not on file  Tobacco Use   Smoking status: Never Smoker   Smokeless tobacco: Never Used  Substance and Sexual Activity   Alcohol use: Never   Drug use: Never   Sexual activity: Not on file  Other Topics Concern   Not on file  Social History Narrative   Not on file   Social Determinants of Health   Financial Resource Strain:    Difficulty of Paying Living Expenses:   Food Insecurity:    Worried About [place] in the Last Year:    Arboriculturist in the Last Year:   Transportation Needs: Unmet Transportation Needs   Lack of Transportation (Medical): Yes   Lack of Transportation (Non-Medical): No  Physical Activity:    Days of Exercise per Week:    Minutes of Exercise per Session:   Stress:    Feeling of Stress :   Social Connections:    Frequency of Communication with Friends and Family:    Frequency of Social Gatherings with Friends and Family:    Attends Religious Services:    Active Member of Clubs or Organizations:    Attends Arts development officer:    Marital Status:   Intimate Partner Violence: Not At Risk   Fear of Current or Ex-Partner: No   Emotionally Abused: No   Physically Abused: No   Sexually Abused: No   Family History: No family history on file.  Review of Systems: Constitutional: Denies fevers, chills or abnormal weight loss Eyes: Denies blurriness of vision Ears, nose, mouth, throat, and face: Denies mucositis or sore throat Respiratory: Denies cough, dyspnea or wheezes Cardiovascular: Denies palpitation, chest discomfort or lower extremity swelling Gastrointestinal:  Denies nausea, constipation, diarrhea GU: Denies dysuria or incontinence Skin: Denies abnormal skin rashes Neurological: Per HPI Musculoskeletal: Denies joint pain, back or neck discomfort. No decrease in ROM Behavioral/Psych: Denies anxiety, disturbance in thought content, and mood instability  Physical Exam: Vitals:   05/30/19 1036  BP: (!) 138/93  Pulse: 83  Resp: 18  Temp: 98.5 F (36.9 C)  SpO2: 100%   KPS: 80. General: Alert, cooperative,  pleasant, in no acute distress Head: Craniotomy scar noted, dry and intact. EENT: No conjunctival injection or scleral icterus. Oral mucosa moist Lungs: Resp effort normal Cardiac: Regular rate and rhythm Abdomen: Soft, non-distended abdomen Skin: No rashes cyanosis or petechiae. Extremities: No clubbing or edema  Neurologic Exam: Mental Status: Awake, alert, attentive to examiner. Oriented to self and environment. Subtle mixed dysphasia motor>sensory. Cranial Nerves: Visual acuity is grossly normal. Right homonymous hemianopia. Extra-ocular movements intact. No ptosis. Face is symmetric, tongue midline. Motor: Tone and bulk are normal. Power is full in both arms and legs. Reflexes are symmetric, no pathologic reflexes present. Intact finger to nose bilaterally Sensory: Intact to light touch and temperature Gait: Deferred  Labs: I have reviewed the data as  listed    Component Value Date/Time   NA 138 05/30/2019 0953   K 4.0 05/30/2019 0953   CL 105 05/30/2019 0953   CO2 23 05/30/2019 0953   GLUCOSE 106 (H) 05/30/2019 0953   BUN 8 05/30/2019 0953   CREATININE 1.37 (H) 05/30/2019 0953   CALCIUM 9.3 05/30/2019 0953   PROT 7.2 05/30/2019 0953   ALBUMIN 4.2 05/30/2019 0953   AST 21 05/30/2019 0953   ALT 20 05/30/2019 0953   ALKPHOS 78 05/30/2019 0953   BILITOT 0.7 05/30/2019 0953   GFRNONAA 57 (L) 05/30/2019 0953   GFRAA >60 05/30/2019 0953   Lab Results  Component Value Date   WBC 6.0 05/30/2019   NEUTROABS 4.4 05/30/2019   HGB 16.3 05/30/2019   HCT 46.8 05/30/2019   MCV 86.7 05/30/2019   PLT 153 05/30/2019   Imaging:  [facility] Clinician Interpretation: I have personally reviewed the CNS images as listed.  My interpretation, in the context of the patient's clinical presentation, is progressive disease  MR BRAIN W WO CONTRAST  Result Date: 05/28/2019 CLINICAL DATA:  59 year old male with left occipital GBM. Status post resection in August 2020 with chemotherapy and radiation. Recent Avastin, Temozolomide. Recent steroid tapering. EXAM: MRI HEAD WITHOUT AND WITH CONTRAST TECHNIQUE: Multiplanar, multiecho pulse sequences of the brain and surrounding structures were obtained without and with intravenous contrast. CONTRAST:  87m MULTIHANCE GADOBENATE DIMEGLUMINE 529 MG/ML IV SOLN COMPARISON:  Brain MRI 03/15/2019 and earlier. FINDINGS: Brain: Confluent 6 cm area of restricted diffusion, heterogeneous T2 and FLAIR hyperintensity replacing most of the left occipital lobe. The anterior extent of abnormal T2 and FLAIR signal has progressed since February, now reaching the posterior deep white matter capsules on series 11, image 14. Additionally, there is substantial new T2/FLAIR hyperintensity and abnormal diffusion medial to the left occipital horn and newly tracking into the splenium of the corpus callosum. See series 11, image 14 and series 6,  image 64). New mass effect on the left atrium of the lateral ventricle which previously was capacious. Also abundant increased left parietal lobe T2 and FLAIR hyperintensity (series 11, image 18). Regional hemosiderin also appears increased (series 13, image 53). Following contrast, faint peripheral enhancement along the area of abnormal diffusion has not significantly changed (series 16, image 84). No superimposed restricted diffusion suggestive of acute infarction. No midline shift, ventriculomegaly, extra-axial collection or acute intracranial hemorrhage. Cervicomedullary junction and pituitary are within normal limits. Chronic anterior frontal lobe encephalomalacia is stable. No other abnormal intracranial enhancement. No dural thickening Vascular: Major intracranial vascular flow voids are stable. The major dural venous sinuses are enhancing and appear to be patent. Skull and upper cervical spine: Stable and negative visible cervical spine. Stable visible osseous structures and bone marrow  signal. Sinuses/Orbits: Stable and negative. Other: Mastoids remain clear. Visible internal auditory structures appear normal. Stable scalp soft tissues. IMPRESSION: 1. Substantially progressed abnormal T2/FLAIR signal and also abnormal diffusion since February, with new involvement of the splenium, and new mass effect on the atrium of the left lateral ventricle. Relatively stable enhancement. The appearance is most compatible with tumor progression in the setting of Avastin. 2. No new intracranial abnormality identified. Electronically Signed   By: Genevie Ann M.D.   On: 05/28/2019 10:46    Assessment/Plan Glioblastoma ([place])  Focal seizures [facility])  Mr. Stetson is clinically and radiographically progressive today.  Although bulky enhancing mass is relatively well controlled, there is clear non-enhancing tumorigenic encroachment of posterior corpus callosum.  We extensively discussed treatment options for salvage second  line therapy.    We recommended intiating treatment with cycle #1 CCNU 90m/m2, given once every 6 weeks. The patient will have a complete blood count performed on days 14 and 28 of each cycle, and a comprehensive metabolic panel performed on day 14 and 28 of each cycle. Labs may need to be performed more often. Zofran will prescribed for home use for nausea/vomiting.  We counseled him on the side effects including nausea/vomiting, cytopenias, rare idiopathic pulmonary fibrosis.   Will continue Avastin 177mkg q2 weeks as concurrent therapy due to good control of enhancing volume.  He is cleared today for Avastin infusion, 1070mg IV q2 weeks.   Avastin should be held for the following:  ANC less than 500  Platelets less than 50,000  LFT or creatinine greater than 2x ULN  If clinical concerns/contraindications develop  Chemotherapy should be held for the following:  ANC less than 1,000  Platelets less than 100,000  LFT or creatinine greater than 2x ULN  If clinical concerns/contraindications develop  Some of his symptomatology is likely 2/2 coritcosteroid withdrawal.  Given his disease progression it would be reasonable to resume decadron as follows: -4mg27mily x 7 days, then decrease to 2mg 46mly  We appreciate the opportunity to participate in the care of MichaBREYDEN JEUDYHe will return to clinic in 2 weeks for next avastin infusion.  All questions were answered. The patient knows to call the clinic with any problems, questions or concerns. No barriers to learning were detected.  The total time spent in the encounter was 40 minutes and more than 50% was on counseling and review of test results   ZachaVentura SellersMedical Director of Neuro-Oncology [facility] LLCesleGravity7/21 10:44 AM

## 2019-05-30 NOTE — Telephone Encounter (Signed)
Oral Oncology Patient Advocate Encounter  After completing a benefits investigation, prior authorization for Lomustine is not required at this time through [employer].  Patient's copay is $0.     [place] [facility] [place] Phone [phone] Fax [phone] 05/30/2019 1:34 PM

## 2019-05-30 NOTE — Patient Instructions (Signed)
[facility] Discharge Instructions for Patients Receiving Chemotherapy  Today you received the following chemotherapy agents Bevacizumab  To help prevent nausea and vomiting after your treatment, we encourage you to take your nausea medication as directed   If you develop nausea and vomiting that is not controlled by your nausea medication, call the clinic.   BELOW ARE SYMPTOMS THAT SHOULD BE REPORTED IMMEDIATELY:  *FEVER GREATER THAN 100.5 F  *CHILLS WITH OR WITHOUT FEVER  NAUSEA AND VOMITING THAT IS NOT CONTROLLED WITH YOUR NAUSEA MEDICATION  *UNUSUAL SHORTNESS OF BREATH  *UNUSUAL BRUISING OR BLEEDING  TENDERNESS IN MOUTH AND THROAT WITH OR WITHOUT PRESENCE OF ULCERS  *URINARY PROBLEMS  *BOWEL PROBLEMS  UNUSUAL RASH Items with * indicate a potential emergency and should be followed up as soon as possible.  Feel free to call the clinic should you have any questions or concerns. The clinic phone number is [phone].  Please show the CHEMO ALERT CARD at check-in to the Emergency Department and triage nurse.   

## 2019-05-30 NOTE — Progress Notes (Signed)
DISCONTINUE ON PATHWAY REGIMEN - Neuro     A cycle is every 28 days:     Temozolomide      Temozolomide   **Always confirm dose/schedule in your pharmacy ordering system**  REASON: Disease Progression PRIOR TREATMENT: [id number]: Temozolomide 150/200 mg/m2 PO D1-5 q28 Days x 6-12 Cycles TREATMENT RESPONSE: Progressive Disease (PD)  START ON PATHWAY REGIMEN - Neuro     A cycle is every 42 days:     Lomustine   **Always confirm dose/schedule in your pharmacy ordering system**  Patient Characteristics: Glioblastoma (Grade IV Glioma), Recurrent or Progressive, Nonsurgical Candidate, Systemic Therapy Candidate Disease Classification: Glioma Disease Classification: Glioblastoma (Grade IV Glioma) Disease Status: Recurrent or Progressive Treatment Classification: Nonsurgical Candidate Treatment (Nonsurgical/Adjuvant): Systemic Therapy Candidate Intent of Therapy: Non-Curative / Palliative Intent, Discussed with Patient 

## 2019-05-30 NOTE — Telephone Encounter (Signed)
Oral Oncology Pharmacist Encounter  Received new prescription for lomustine for the treatment of GBM in conjunction with Avastin, planned duration until disease progression or unacceptable drug toxicity.  CBC from 05/30/19 assessed, no relevant lab abnormalities. Prescription dose and frequency assessed.   Current medication list in Epic reviewed, no DDIs with lomustine identified.  Prescription has been e-scribed to the [facility] for benefits analysis and approval.  Oral Oncology Clinic will continue to follow for insurance authorization, copayment issues, initial counseling and start date.  Darl Pikes, PharmD, BCPS, BCOP, CPP Hematology/Oncology Clinical Pharmacist [facility] [phone]  05/30/2019 4:06 PM

## 2019-05-31 ENCOUNTER — Inpatient Hospital Stay: Payer: BC Managed Care – PPO

## 2019-05-31 ENCOUNTER — Telehealth: Payer: Self-pay | Admitting: Internal Medicine

## 2019-05-31 ENCOUNTER — Other Ambulatory Visit: Payer: Self-pay | Admitting: Internal Medicine

## 2019-05-31 MED FILL — GLEOSTINE 100 MG CAPSULE: 100 | 1 days supply | Qty: 1 | Fill #0

## 2019-05-31 MED FILL — GLEOSTINE 40 MG CAPSULE: 40 | 1 days supply | Qty: 2 | Fill #0

## 2019-05-31 MED FILL — GLEOSTINE 10 MG CAPSULE: 10 | 1 days supply | Qty: 1 | Fill #0

## 2019-05-31 NOTE — Telephone Encounter (Signed)
Oral Chemotherapy Pharmacist Encounter  [facility] will ship out the lomustine to Mr. Cody [name] for delivery on 06/01/19.  Patient Education I spoke with patient for overview of new oral chemotherapy medication: lomustine for the treatment of GBM in conjunction with Avastin, planned duration until disease progression or unacceptable drug toxicity.   Counseled patient on administration, dosing, side effects, monitoring, drug-food interactions, safe handling, storage, and disposal. Patient will take 190 mg by mouth once for 1 dose every 6 weeks. Take on an emtpy stomach 1 hour before or 2 hours after meals.  Side effects include but not limited to: decreased wbc/plt and N/V.    Reviewed with patient importance of keeping a medication schedule and plan for any missed doses.  Cody [name] voiced understanding and appreciation. All questions answered. Medication handout placed in the mail.  Provided patient with [facility] phone number. Patient knows to call the office with questions or concerns. Oral Chemotherapy Navigation Clinic will continue to follow.  Darl Pikes, PharmD, BCPS, BCOP, CPP Hematology/Oncology Clinical Pharmacist [facility] Oral [facility] [phone]  05/31/2019 3:32 PM

## 2019-05-31 NOTE — Telephone Encounter (Signed)
Scheduled appt per 4/27 los.  Spoke with pt and he is aware of the appt date and time,

## 2019-06-01 NOTE — Telephone Encounter (Signed)
Last Office visit states to continue Decadron with specific dosing that doesn't match this order.  Please review and refill accordingly. Thanks!

## 2019-06-02 ENCOUNTER — Telehealth: Payer: Self-pay | Admitting: *Deleted

## 2019-06-02 NOTE — Telephone Encounter (Signed)
Received vm message from patient regarding his next appts.   TCT patient and reviewed his appts with him. He states he wrote it down and voiced his appreciation for call back

## 2019-06-07 ENCOUNTER — Ambulatory Visit: Payer: BC Managed Care – PPO | Attending: Internal Medicine

## 2019-06-07 ENCOUNTER — Other Ambulatory Visit: Payer: Self-pay | Admitting: Radiation Therapy

## 2019-06-07 DIAGNOSIS — Z23 Encounter for immunization: Secondary | ICD-10-CM

## 2019-06-07 NOTE — Progress Notes (Signed)
   Covid-19 Vaccination Clinic  Name:  PERCY GERSH    MRN: [id number] DOB: 1960/09/07  06/07/2019  Mr. Asselta was observed post Covid-19 immunization for 15 minutes without incident. He was provided with Vaccine Information Sheet and instruction to access the V-Safe system.   Mr. Maday was instructed to call 911 with any severe reactions post vaccine: [name] Difficulty breathing  . Swelling of face and throat  . A fast heartbeat  . A bad rash all over body  . Dizziness and weakness   Immunizations Administered    Name Date Dose VIS Date Route   Pfizer COVID-19 Vaccine 06/07/2019  4:21 PM 0.3 mL 03/29/2018 Intramuscular   Manufacturer: [place]   Lot: [id number]   [place]: [id number]

## 2019-06-12 ENCOUNTER — Inpatient Hospital Stay: Payer: BC Managed Care – PPO

## 2019-06-12 ENCOUNTER — Inpatient Hospital Stay: Payer: BC Managed Care – PPO | Admitting: Nutrition

## 2019-06-12 ENCOUNTER — Inpatient Hospital Stay: Payer: BC Managed Care – PPO | Admitting: Internal Medicine

## 2019-06-12 ENCOUNTER — Inpatient Hospital Stay: Payer: BC Managed Care – PPO | Attending: Internal Medicine

## 2019-06-12 ENCOUNTER — Other Ambulatory Visit: Payer: Self-pay

## 2019-06-12 VITALS — BP 150/85 | HR 58 | Temp 98.7°F | Resp 18 | Ht 72.0 in | Wt 190.9 lb

## 2019-06-12 DIAGNOSIS — Z9221 Personal history of antineoplastic chemotherapy: Secondary | ICD-10-CM | POA: Diagnosis not present

## 2019-06-12 DIAGNOSIS — R569 Unspecified convulsions: Secondary | ICD-10-CM | POA: Diagnosis not present

## 2019-06-12 DIAGNOSIS — Z5112 Encounter for antineoplastic immunotherapy: Secondary | ICD-10-CM | POA: Insufficient documentation

## 2019-06-12 DIAGNOSIS — C714 Malignant neoplasm of occipital lobe: Secondary | ICD-10-CM | POA: Diagnosis not present

## 2019-06-12 DIAGNOSIS — C719 Malignant neoplasm of brain, unspecified: Secondary | ICD-10-CM

## 2019-06-12 LAB — CBC WITH DIFFERENTIAL (CANCER CENTER ONLY)
Abs Immature Granulocytes: 0.03 10*3/uL (ref 0.00–0.07)
Basophils Absolute: 0 10*3/uL (ref 0.0–0.1)
Basophils Relative: 0 %
Eosinophils Absolute: 0 10*3/uL (ref 0.0–0.5)
Eosinophils Relative: 0 %
HCT: 44.5 % (ref 39.0–52.0)
Hemoglobin: 15.3 g/dL (ref 13.0–17.0)
Immature Granulocytes: 0 %
Lymphocytes Relative: 7 %
Lymphs Abs: 0.6 10*3/uL — ABNORMAL LOW (ref 0.7–4.0)
MCH: 29 pg (ref 26.0–34.0)
MCHC: 34.4 g/dL (ref 30.0–36.0)
MCV: 84.3 fL (ref 80.0–100.0)
Monocytes Absolute: 0.3 10*3/uL (ref 0.1–1.0)
Monocytes Relative: 3 %
Neutro Abs: 8.8 10*3/uL — ABNORMAL HIGH (ref 1.7–7.7)
Neutrophils Relative %: 90 %
Platelet Count: 161 10*3/uL (ref 150–400)
RBC: 5.28 MIL/uL (ref 4.22–5.81)
RDW: 13.2 % (ref 11.5–15.5)
WBC Count: 9.7 10*3/uL (ref 4.0–10.5)
nRBC: 0 % (ref 0.0–0.2)

## 2019-06-12 LAB — TOTAL PROTEIN, URINE DIPSTICK: Protein, ur: NEGATIVE mg/dL

## 2019-06-12 LAB — CMP (CANCER CENTER ONLY)
ALT: 25 U/L (ref 0–44)
AST: 30 U/L (ref 15–41)
Albumin: 3.8 g/dL (ref 3.5–5.0)
Alkaline Phosphatase: 68 U/L (ref 38–126)
Anion gap: 10 (ref 5–15)
BUN: 10 mg/dL (ref 6–20)
CO2: 23 mmol/L (ref 22–32)
Calcium: 8.9 mg/dL (ref 8.9–10.3)
Chloride: 109 mmol/L (ref 98–111)
Creatinine: 1.29 mg/dL — ABNORMAL HIGH (ref 0.61–1.24)
GFR, Est AFR Am: >60 mL/min (ref >60)
GFR, Estimated: >60 mL/min (ref >60)
Glucose, Bld: 131 mg/dL — ABNORMAL HIGH (ref 70–99)
Potassium: 3.7 mmol/L (ref 3.5–5.1)
Sodium: 142 mmol/L (ref 135–145)
Total Bilirubin: 0.4 mg/dL (ref 0.3–1.2)
Total Protein: 6.5 g/dL (ref 6.5–8.1)

## 2019-06-12 MED ORDER — SODIUM CHLORIDE 0.9 % IV SOLN
Freq: Once | INTRAVENOUS | Status: AC
  Filled 2019-06-12: qty 250

## 2019-06-12 MED ORDER — SODIUM CHLORIDE 0.9 % IV SOLN
10.0000 mg/kg | Freq: Once | INTRAVENOUS | Status: AC
  Administered 2019-06-12: 900 mg via INTRAVENOUS
  Filled 2019-06-12: qty 32

## 2019-06-12 NOTE — Progress Notes (Signed)
[facility] [place], [place] [address] [phone]   Interval Evaluation  Date of Service: 06/12/19 Patient Name: Cody [name] Patient MRN: [id number] Patient DOB: 08-27-1960 Provider: Ventura Sellers, MD  Identifying Statement:  Cody [name] is a 59 y.o. male with left occipital glioblastoma   Oncologic History: Oncology History  Glioblastoma ([place])  09/29/2018 Surgery   Craniotomy, resection with Dr. Kathyrn Sheriff   10/21/2018 - 10/31/2018 Chemotherapy   The patient had [No matching medication found in this treatment plan]  for chemotherapy treatment.    01/09/2019 - 05/18/2019 Chemotherapy   The patient had [No matching medication found in this treatment plan]  for chemotherapy treatment.    01/23/2019 -  Chemotherapy   The patient had bevacizumab-bvzr (ZIRABEV) 800 mg in sodium chloride 0.9 % 100 mL chemo infusion, 10 mg/kg = 800 mg, Intravenous,  Once, 9 of 9 cycles Administration: 800 mg (01/23/2019), 800 mg (02/09/2019), 900 mg (02/24/2019), 900 mg (03/10/2019), 900 mg (03/24/2019), 900 mg (04/07/2019), 900 mg (04/21/2019), 900 mg (05/05/2019), 900 mg (05/30/2019)  for chemotherapy treatment.    05/30/2019 -  Chemotherapy   The patient had [No matching medication found in this treatment plan]  for chemotherapy treatment.      Biomarkers:  MGMT Unknown.  IDH 1/2 Wild type.  EGFR Unknown  TERT Unknown   Interval History:  Cody [name] presents today for avastin infusion.  He and his wife describe relative improvement in neurologic deficits since increasing the decadron to 26m daily. Headaches have stabilized.  Word finding difficulty is still present, although maintains mostly fluent speech.  Fatigue and lethargy have been improved as well as appetite. Vision on the right side is still impaired as prior. No issues with gait or motor control. He has had no recurrent seizure events.  Continues on 414mdaily  decadron.  H+P (10/18/18) Patient presented to medical attention in August 2020 with several weeks history of new onset headaches.  Progressive nature of symptoms led to an ED admission, where CNS imaging demonstrated enhancing mass in the left occipital lobe.  He underwent craniotomy and resection with Dr. NuKathyrn Sheriffn 09/11/91/57ithout complication.  He was discharged to home with small visual field impairment and otherwise functionally intact.  He denies headaches or any seizures since surgery, having completed decadron taper.    Medications: Current Outpatient Medications on File Prior to Visit  Medication Sig Dispense Refill  . dexamethasone (DECADRON) 4 MG tablet Take 1 tablet (4 mg total) by mouth daily. 30 tablet 1  . finasteride (PROSCAR) 5 MG tablet Take 5 mg by mouth daily.    . Marland KitchenevETIRAcetam (KEPPRA) 500 MG tablet TAKE 2 TABLETS(1000 MG) BY MOUTH TWICE DAILY 360 tablet 1  . tamsulosin (FLOMAX) 0.4 MG CAPS capsule Take 0.4 mg by mouth daily.    . Marland KitchenLEOSTINE 10 MG capsule Take 10 mg by mouth once. CHEMO every 42 days    . GLEOSTINE 100 MG capsule Take 100 mg by mouth once. CHEMO, every 42 days    . GLEOSTINE 40 MG capsule Take 2 capsules by mouth See admin instructions. CHEMO, every 42 days    . traMADol (ULTRAM) 50 MG tablet TAKE 1 TABLET(50 MG) BY MOUTH EVERY 6 HOURS AS NEEDED FOR SEVERE PAIN (Patient not taking: Reported on 06/12/2019) 30 tablet 1   No current facility-administered medications on file prior to visit.    Allergies: No Known Allergies Past Medical History:  Past Medical History:  Diagnosis Date  . Enlarged prostate   . Migraines    Past Surgical History:  Past Surgical History:  Procedure Laterality Date  . APPLICATION OF CRANIAL NAVIGATION Left 09/29/2018   Procedure: APPLICATION OF CRANIAL NAVIGATION;  Surgeon: [name], MD;  Location: [facility];  Service: Neurosurgery;  Laterality: Left;  . CRANIOTOMY Left 09/29/2018   Procedure: Sterotactic Left  craniotomy for resection of tumor;  Surgeon: [name], MD;  Location: [facility];  Service: Neurosurgery;  Laterality: Left;  Sterotactic Left craniotomy for resection of tumor   Social History:  Social History   Socioeconomic History  . Marital status: Married    Spouse name: Not on file  . Number of children: Not on file  . Years of education: Not on file  . Highest education level: Not on file  Occupational History  . Not on file  Tobacco Use  . Smoking status: Never Smoker  . Smokeless tobacco: Never Used  Substance and Sexual Activity  . Alcohol use: Never  . Drug use: Never  . Sexual activity: Not on file  Other Topics Concern  . Not on file  Social History Narrative  . Not on file   Social Determinants of Health   Financial Resource Strain:   . Difficulty of Paying Living Expenses:   Food Insecurity:   . Worried About Charity fundraiser in the Last Year:   . Arboriculturist in the Last Year:   Transportation Needs: Unmet Transportation Needs  . Lack of Transportation (Medical): Yes  . Lack of Transportation (Non-Medical): No  Physical Activity:   . Days of Exercise per Week:   . Minutes of Exercise per Session:   Stress:   . Feeling of Stress :   Social Connections:   . Frequency of Communication with Friends and Family:   . Frequency of Social Gatherings with Friends and Family:   . Attends Religious Services:   . Active Member of Clubs or Organizations:   . Attends Archivist Meetings:   [name] Marital Status:   Intimate Partner Violence: Not At Risk  . Fear of Current or Ex-Partner: No  . Emotionally Abused: No  . Physically Abused: No  . Sexually Abused: No   Family History: No family history on file.  Review of Systems: Constitutional: Denies fevers, chills or abnormal weight loss Eyes: Denies blurriness of vision Ears, nose, mouth, throat, and face: Denies mucositis or sore throat Respiratory: Denies cough, dyspnea or  wheezes Cardiovascular: Denies palpitation, chest discomfort or lower extremity swelling Gastrointestinal:  Denies nausea, constipation, diarrhea GU: Denies dysuria or incontinence Skin: Denies abnormal skin rashes Neurological: Per HPI Musculoskeletal: Denies joint pain, back or neck discomfort. No decrease in ROM Behavioral/Psych: Denies anxiety, disturbance in thought content, and mood instability  Physical Exam: Vitals:   06/12/19 1233  BP: (!) 150/85  Pulse: (!) 58  Resp: 18  Temp: 98.7 F (37.1 C)  SpO2: 100%   KPS: 80. General: Alert, cooperative, pleasant, in no acute distress Head: Craniotomy scar noted, dry and intact. EENT: No conjunctival injection or scleral icterus. Oral mucosa moist Lungs: Resp effort normal Cardiac: Regular rate and rhythm Abdomen: Soft, non-distended abdomen Skin: No rashes cyanosis or petechiae. Extremities: No clubbing or edema  Neurologic Exam: Mental Status: Awake, alert, attentive to examiner. Oriented to self and environment. Subtle mixed dysphasia motor>sensory. Cranial Nerves: Visual acuity is grossly normal. Right homonymous hemianopia. Extra-ocular movements intact. No ptosis. Face is  symmetric, tongue midline. Motor: Tone and bulk are normal. Power is full in both arms and legs. Reflexes are symmetric, no pathologic reflexes present. Intact finger to nose bilaterally Sensory: Intact to light touch and temperature Gait: Deferred  Labs: I have reviewed the data as listed    Component Value Date/Time   NA 138 05/30/2019 0953   K 4.0 05/30/2019 0953   CL 105 05/30/2019 0953   CO2 23 05/30/2019 0953   GLUCOSE 106 (H) 05/30/2019 0953   BUN 8 05/30/2019 0953   CREATININE 1.37 (H) 05/30/2019 0953   CALCIUM 9.3 05/30/2019 0953   PROT 7.2 05/30/2019 0953   ALBUMIN 4.2 05/30/2019 0953   AST 21 05/30/2019 0953   ALT 20 05/30/2019 0953   ALKPHOS 78 05/30/2019 0953   BILITOT 0.7 05/30/2019 0953   GFRNONAA 57 (L) 05/30/2019 0953    GFRAA >60 05/30/2019 0953   Lab Results  Component Value Date   WBC 9.7 06/12/2019   NEUTROABS 8.8 (H) 06/12/2019   HGB 15.3 06/12/2019   HCT 44.5 06/12/2019   MCV 84.3 06/12/2019   PLT 161 06/12/2019   Imaging:  [facility] Clinician Interpretation: I have personally reviewed the CNS images as listed.  My interpretation, in the context of the patient's clinical presentation, is progressive disease  MR BRAIN W WO CONTRAST  Result Date: 05/28/2019 CLINICAL DATA:  59 year old male with left occipital GBM. Status post resection in August 2020 with chemotherapy and radiation. Recent Avastin, Temozolomide. Recent steroid tapering. EXAM: MRI HEAD WITHOUT AND WITH CONTRAST TECHNIQUE: Multiplanar, multiecho pulse sequences of the brain and surrounding structures were obtained without and with intravenous contrast. CONTRAST:  44m MULTIHANCE GADOBENATE DIMEGLUMINE 529 MG/ML IV SOLN COMPARISON:  Brain MRI 03/15/2019 and earlier. FINDINGS: Brain: Confluent 6 cm area of restricted diffusion, heterogeneous T2 and FLAIR hyperintensity replacing most of the left occipital lobe. The anterior extent of abnormal T2 and FLAIR signal has progressed since February, now reaching the posterior deep white matter capsules on series 11, image 14. Additionally, there is substantial new T2/FLAIR hyperintensity and abnormal diffusion medial to the left occipital horn and newly tracking into the splenium of the corpus callosum. See series 11, image 14 and series 6, image 64). New mass effect on the left atrium of the lateral ventricle which previously was capacious. Also abundant increased left parietal lobe T2 and FLAIR hyperintensity (series 11, image 18). Regional hemosiderin also appears increased (series 13, image 53). Following contrast, faint peripheral enhancement along the area of abnormal diffusion has not significantly changed (series 16, image 84). No superimposed restricted diffusion suggestive of acute infarction. No  midline shift, ventriculomegaly, extra-axial collection or acute intracranial hemorrhage. Cervicomedullary junction and pituitary are within normal limits. Chronic anterior frontal lobe encephalomalacia is stable. No other abnormal intracranial enhancement. No dural thickening Vascular: Major intracranial vascular flow voids are stable. The major dural venous sinuses are enhancing and appear to be patent. Skull and upper cervical spine: Stable and negative visible cervical spine. Stable visible osseous structures and bone marrow signal. Sinuses/Orbits: Stable and negative. Other: Mastoids remain clear. Visible internal auditory structures appear normal. Stable scalp soft tissues. IMPRESSION: 1. Substantially progressed abnormal T2/FLAIR signal and also abnormal diffusion since February, with new involvement of the splenium, and new mass effect on the atrium of the left lateral ventricle. Relatively stable enhancement. The appearance is most compatible with tumor progression in the setting of Avastin. 2. No new intracranial abnormality identified. Electronically Signed   By: HHerminio HeadsD.  On: 05/28/2019 10:46    Assessment/Plan Glioblastoma ([place]) - Plan: DISCONTINUED: 0.9 %  sodium chloride infusion, DISCONTINUED: bevacizumab-bvzr (ZIRABEV) 900 mg in sodium chloride 0.9 % 100 mL chemo infusion  Focal seizures [facility])  Mr. Chap is clinically stable today.  He is cleared for avastin infusion 68m/kg.   We recommended continuing treatment with cycle #1 (day 1/42) CCNU 973mm2, given once every 6 weeks. The patient will have a complete blood count performed on days 14 and 28 of each cycle, and a comprehensive metabolic panel performed on day 14 and 28 of each cycle. Labs may need to be performed more often. Zofran will prescribed for home use for nausea/vomiting.  We counseled him on the side effects including nausea/vomiting, cytopenias, rare idiopathic pulmonary fibrosis.   Will continue Avastin 1038mg  q2 weeks as concurrent therapy due to good control of enhancing volume.    Avastin should be held for the following:  ANC less than 500  Platelets less than 50,000  LFT or creatinine greater than 2x ULN  If clinical concerns/contraindications develop  Chemotherapy should be held for the following:  ANC less than 1,000  Platelets less than 100,000  LFT or creatinine greater than 2x ULN  If clinical concerns/contraindications develop  Recommended decreasing decadron to 3mg35mily.  We appreciate the opportunity to participate in the care of MichJABAREE MERCADO He will return to clinic in 2 weeks for next avastin infusion and labs.  All questions were answered. The patient knows to call the clinic with any problems, questions or concerns. No barriers to learning were detected.  The total time spent in the encounter was 30 minutes and more than 50% was on counseling and review of test results   ZachVentura Sellers Medical Director of Neuro-Oncology [facility] LLCWeslLawrenceburg10/21 12:41 PM

## 2019-06-12 NOTE — Patient Instructions (Signed)
[facility] Discharge Instructions for Patients Receiving Chemotherapy  Today you received the following chemotherapy agents Bevacizumab  To help prevent nausea and vomiting after your treatment, we encourage you to take your nausea medication as directed   If you develop nausea and vomiting that is not controlled by your nausea medication, call the clinic.   BELOW ARE SYMPTOMS THAT SHOULD BE REPORTED IMMEDIATELY:  *FEVER GREATER THAN 100.5 F  *CHILLS WITH OR WITHOUT FEVER  NAUSEA AND VOMITING THAT IS NOT CONTROLLED WITH YOUR NAUSEA MEDICATION  *UNUSUAL SHORTNESS OF BREATH  *UNUSUAL BRUISING OR BLEEDING  TENDERNESS IN MOUTH AND THROAT WITH OR WITHOUT PRESENCE OF ULCERS  *URINARY PROBLEMS  *BOWEL PROBLEMS  UNUSUAL RASH Items with * indicate a potential emergency and should be followed up as soon as possible.  Feel free to call the clinic should you have any questions or concerns. The clinic phone number is [phone].  Please show the CHEMO ALERT CARD at check-in to the Emergency Department and triage nurse.   

## 2019-06-12 NOTE — Progress Notes (Signed)
Patient was identified to be at risk for malnutrition on the MST secondary to weight loss and poor appetite.  Patient is a 59 year old male diagnosed with glioblastoma receiving chemotherapy.  He is a patient of Dr. Mickeal Skinner.  Past medical history includes migraines.  Medications include Decadron.  Labs include glucose 106 and creatinine 1.37 on April 27.  Height: 6 feet 0 inches. Weight: 190.9 pounds. Usual body weight: Approximately 205 pounds April 2. BMI: 25.89.  Patient has lost 7% of his body weight over 6 weeks which is significant. Patient reports decreased oral intake but does not attribute this to a nutrition impact symptom.  Nutrition diagnosis: Inadequate oral intake related to glioblastoma as evidenced by 7% weight loss from usual body weight.  Intervention: I educated patient to strive for weight maintenance by consuming adequate calories and protein throughout the day. Reviewed some high-protein foods. Provided fact sheets.  Questions were answered.  Teach back method used.  Contact information given.  Monitoring, evaluation, goals: Patient will tolerate adequate calories and protein for weight maintenance.  No follow-up is scheduled.  Patient has my contact information.  **Disclaimer: This note was dictated with voice recognition software. Similar sounding words can inadvertently be transcribed and this note may contain transcription errors which may not have been corrected upon publication of note.**

## 2019-06-13 ENCOUNTER — Inpatient Hospital Stay: Payer: BC Managed Care – PPO

## 2019-06-13 ENCOUNTER — Telehealth: Payer: Self-pay | Admitting: Internal Medicine

## 2019-06-13 NOTE — Telephone Encounter (Signed)
Scheduled appt per 5/10 los.  Spoke with pt and he is aware of his appt date and time.

## 2019-06-20 NOTE — Progress Notes (Signed)
Pharmacist Chemotherapy Monitoring - Follow Up Assessment    I verify that I have reviewed each item in the below checklist:  . Regimen for the patient is scheduled for the appropriate day and plan matches scheduled date. [name] Appropriate non-routine labs are ordered dependent on drug ordered. . If applicable, additional medications reviewed and ordered per protocol based on lifetime cumulative doses and/or treatment regimen.   Plan for follow-up and/or issues identified: No . I-vent associated with next due treatment: No . MD and/or nursing notified: No  Philomena Course 06/20/2019 8:38 AM

## 2019-06-26 ENCOUNTER — Inpatient Hospital Stay: Payer: BC Managed Care – PPO

## 2019-06-26 ENCOUNTER — Other Ambulatory Visit: Payer: Self-pay

## 2019-06-26 ENCOUNTER — Inpatient Hospital Stay (HOSPITAL_BASED_OUTPATIENT_CLINIC_OR_DEPARTMENT_OTHER): Payer: BC Managed Care – PPO | Admitting: Internal Medicine

## 2019-06-26 VITALS — BP 150/83 | HR 63 | Temp 97.9°F | Resp 20 | Ht 72.0 in | Wt 191.0 lb

## 2019-06-26 VITALS — BP 141/78

## 2019-06-26 DIAGNOSIS — R569 Unspecified convulsions: Secondary | ICD-10-CM | POA: Diagnosis not present

## 2019-06-26 DIAGNOSIS — C719 Malignant neoplasm of brain, unspecified: Secondary | ICD-10-CM

## 2019-06-26 DIAGNOSIS — C714 Malignant neoplasm of occipital lobe: Secondary | ICD-10-CM | POA: Diagnosis not present

## 2019-06-26 LAB — CMP (CANCER CENTER ONLY)
ALT: 23 U/L (ref 0–44)
AST: 14 U/L — ABNORMAL LOW (ref 15–41)
Albumin: 3.6 g/dL (ref 3.5–5.0)
Alkaline Phosphatase: 99 U/L (ref 38–126)
Anion gap: 9 (ref 5–15)
BUN: 13 mg/dL (ref 6–20)
CO2: 26 mmol/L (ref 22–32)
Calcium: 8.5 mg/dL — ABNORMAL LOW (ref 8.9–10.3)
Chloride: 107 mmol/L (ref 98–111)
Creatinine: 1.29 mg/dL — ABNORMAL HIGH (ref 0.61–1.24)
GFR, Est AFR Am: >60 mL/min (ref >60)
GFR, Estimated: >60 mL/min (ref >60)
Glucose, Bld: 125 mg/dL — ABNORMAL HIGH (ref 70–99)
Potassium: 4 mmol/L (ref 3.5–5.1)
Sodium: 142 mmol/L (ref 135–145)
Total Bilirubin: 0.4 mg/dL (ref 0.3–1.2)
Total Protein: 6.3 g/dL — ABNORMAL LOW (ref 6.5–8.1)

## 2019-06-26 LAB — CBC WITH DIFFERENTIAL (CANCER CENTER ONLY)
Abs Immature Granulocytes: 0.04 10*3/uL (ref 0.00–0.07)
Basophils Absolute: 0 10*3/uL (ref 0.0–0.1)
Basophils Relative: 0 %
Eosinophils Absolute: 0 10*3/uL (ref 0.0–0.5)
Eosinophils Relative: 0 %
HCT: 45.5 % (ref 39.0–52.0)
Hemoglobin: 15.8 g/dL (ref 13.0–17.0)
Immature Granulocytes: 0 %
Lymphocytes Relative: 7 %
Lymphs Abs: 0.6 10*3/uL — ABNORMAL LOW (ref 0.7–4.0)
MCH: 29.8 pg (ref 26.0–34.0)
MCHC: 34.7 g/dL (ref 30.0–36.0)
MCV: 85.7 fL (ref 80.0–100.0)
Monocytes Absolute: 0.5 10*3/uL (ref 0.1–1.0)
Monocytes Relative: 5 %
Neutro Abs: 8.4 10*3/uL — ABNORMAL HIGH (ref 1.7–7.7)
Neutrophils Relative %: 88 %
Platelet Count: 106 10*3/uL — ABNORMAL LOW (ref 150–400)
RBC: 5.31 MIL/uL (ref 4.22–5.81)
RDW: 14.3 % (ref 11.5–15.5)
WBC Count: 9.6 10*3/uL (ref 4.0–10.5)
nRBC: 0 % (ref 0.0–0.2)

## 2019-06-26 LAB — TOTAL PROTEIN, URINE DIPSTICK: Protein, ur: NEGATIVE mg/dL

## 2019-06-26 MED ORDER — SODIUM CHLORIDE 0.9 % IV SOLN
10.0000 mg/kg | Freq: Once | INTRAVENOUS | Status: AC
  Administered 2019-06-26: 900 mg via INTRAVENOUS
  Filled 2019-06-26: qty 32

## 2019-06-26 MED ORDER — SODIUM CHLORIDE 0.9 % IV SOLN
Freq: Once | INTRAVENOUS | Status: AC
  Filled 2019-06-26: qty 250

## 2019-06-26 NOTE — Patient Instructions (Signed)
[facility] Discharge Instructions for Patients Receiving Chemotherapy  Today you received the following chemotherapy agents Bevacizumab  To help prevent nausea and vomiting after your treatment, we encourage you to take your nausea medication as directed   If you develop nausea and vomiting that is not controlled by your nausea medication, call the clinic.   BELOW ARE SYMPTOMS THAT SHOULD BE REPORTED IMMEDIATELY:  *FEVER GREATER THAN 100.5 F  *CHILLS WITH OR WITHOUT FEVER  NAUSEA AND VOMITING THAT IS NOT CONTROLLED WITH YOUR NAUSEA MEDICATION  *UNUSUAL SHORTNESS OF BREATH  *UNUSUAL BRUISING OR BLEEDING  TENDERNESS IN MOUTH AND THROAT WITH OR WITHOUT PRESENCE OF ULCERS  *URINARY PROBLEMS  *BOWEL PROBLEMS  UNUSUAL RASH Items with * indicate a potential emergency and should be followed up as soon as possible.  Feel free to call the clinic should you have any questions or concerns. The clinic phone number is [phone].  Please show the CHEMO ALERT CARD at check-in to the Emergency Department and triage nurse.   

## 2019-06-26 NOTE — Progress Notes (Signed)
[facility] [facility] [place] Cody [name], [place] [address] [phone]   Interval Evaluation  Date of Service: 06/26/19 Patient Name: Cody [name] Patient MRN: [id number] Patient DOB: 03-08-1960 Provider: Ventura Sellers, MD  Identifying Statement:  Cody [name] is a 59 y.o. male with left occipital glioblastoma   Oncologic History: Oncology History  Glioblastoma ([place])  09/29/2018 Surgery   Craniotomy, resection with Dr. Kathyrn Sheriff   10/21/2018 - 10/31/2018 Chemotherapy   The patient had [No matching medication found in this treatment plan]  for chemotherapy treatment.    01/09/2019 - 05/18/2019 Chemotherapy   The patient had [No matching medication found in this treatment plan]  for chemotherapy treatment.    01/23/2019 -  Chemotherapy   The patient had bevacizumab-bvzr (ZIRABEV) 800 mg in sodium chloride 0.9 % 100 mL chemo infusion, 10 mg/kg = 800 mg, Intravenous,  Once, 10 of 12 cycles Administration: 800 mg (01/23/2019), 800 mg (02/09/2019), 900 mg (02/24/2019), 900 mg (03/10/2019), 900 mg (03/24/2019), 900 mg (04/07/2019), 900 mg (04/21/2019), 900 mg (05/05/2019), 900 mg (05/30/2019), 900 mg (06/12/2019)  for chemotherapy treatment.    05/30/2019 -  Chemotherapy   The patient had [No matching medication found in this treatment plan]  for chemotherapy treatment.      Biomarkers:  MGMT Unknown.  IDH 1/2 Wild type.  EGFR Unknown  TERT Unknown   Interval History:  Cody [name] presents today for avastin infusion.  He and his wife describe relative stability of neurologic deficits. Headaches have stabilized.  Word finding difficulty is still present, although maintains mostly fluent speech.  Fatigue and lethargy have been stable. Vision on the right side is still impaired as prior. No issues with gait or motor control. He has had no recurrent seizure events.  Continues on 41m daily decadron.  H+P (10/18/18) Patient presented to  medical attention in August 2020 with several weeks history of new onset headaches.  Progressive nature of symptoms led to an ED admission, where CNS imaging demonstrated enhancing mass in the left occipital lobe.  He underwent craniotomy and resection with Dr. [name] 83/21/22without complication.  He was discharged to home with small visual field impairment and otherwise functionally intact.  He denies headaches or any seizures since surgery, having completed decadron taper.    Medications: Current Outpatient Medications on File Prior to Visit  Medication Sig Dispense Refill  . dexamethasone (DECADRON) 2 MG tablet Take 2 mg by mouth daily.    . finasteride (PROSCAR) 5 MG tablet Take 5 mg by mouth daily.    .Marland KitchenlevETIRAcetam (KEPPRA) 500 MG tablet TAKE 2 TABLETS(1000 MG) BY MOUTH TWICE DAILY 360 tablet 1  . tamsulosin (FLOMAX) 0.4 MG CAPS capsule Take 0.4 mg by mouth daily.    .Marland KitchenGLEOSTINE 10 MG capsule Take 10 mg by mouth once. CHEMO every 42 days    . GLEOSTINE 100 MG capsule Take 100 mg by mouth once. CHEMO, every 42 days    . GLEOSTINE 40 MG capsule Take 2 capsules by mouth See admin instructions. CHEMO, every 42 days    . traMADol (ULTRAM) 50 MG tablet TAKE 1 TABLET(50 MG) BY MOUTH EVERY 6 HOURS AS NEEDED FOR SEVERE PAIN (Patient not taking: Reported on 06/12/2019) 30 tablet 1   No current facility-administered medications on file prior to visit.    Allergies: No Known Allergies Past Medical History:  Past Medical History:  Diagnosis Date  . Enlarged prostate   .  Migraines    Past Surgical History:  Past Surgical History:  Procedure Laterality Date  . APPLICATION OF CRANIAL NAVIGATION Left 09/29/2018   Procedure: APPLICATION OF CRANIAL NAVIGATION;  Surgeon: [name], MD;  Location: [facility];  Service: Neurosurgery;  Laterality: Left;  . CRANIOTOMY Left 09/29/2018   Procedure: Sterotactic Left craniotomy for resection of tumor;  Surgeon: [name], MD;  Location: [facility];  Service: Neurosurgery;  Laterality: Left;  Sterotactic Left craniotomy for resection of tumor   Social History:  Social History   Socioeconomic History  . Marital status: Married    Spouse name: Not on file  . Number of children: Not on file  . Years of education: Not on file  . Highest education level: Not on file  Occupational History  . Not on file  Tobacco Use  . Smoking status: Never Smoker  . Smokeless tobacco: Never Used  Substance and Sexual Activity  . Alcohol use: Never  . Drug use: Never  . Sexual activity: Not on file  Other Topics Concern  . Not on file  Social History Narrative  . Not on file   Social Determinants of Health   Financial Resource Strain:   . Difficulty of Paying Living Expenses:   Food Insecurity:   . Worried About Charity fundraiser in the Last Year:   . Arboriculturist in the Last Year:   Transportation Needs: Unmet Transportation Needs  . Lack of Transportation (Medical): Yes  . Lack of Transportation (Non-Medical): No  Physical Activity:   . Days of Exercise per Week:   . Minutes of Exercise per Session:   Stress:   . Feeling of Stress :   Social Connections:   . Frequency of Communication with Friends and Family:   . Frequency of Social Gatherings with Friends and Family:   . Attends Religious Services:   . Active Member of Clubs or Organizations:   . Attends Archivist Meetings:   [name] Marital Status:   Intimate Partner Violence: Not At Risk  . Fear of Current or Ex-Partner: No  . Emotionally Abused: No  . Physically Abused: No  . Sexually Abused: No   Family History: No family history on file.  Review of Systems: Constitutional: Denies fevers, chills or abnormal weight loss Eyes: Denies blurriness of vision Ears, nose, mouth, throat, and face: Denies mucositis or sore throat Respiratory: Denies cough, dyspnea or wheezes Cardiovascular: Denies palpitation, chest discomfort or lower extremity  swelling Gastrointestinal:  Denies nausea, constipation, diarrhea GU: Denies dysuria or incontinence Skin: Denies abnormal skin rashes Neurological: Per HPI Musculoskeletal: Denies joint pain, back or neck discomfort. No decrease in ROM Behavioral/Psych: Denies anxiety, disturbance in thought content, and mood instability  Physical Exam: Vitals:   06/26/19 1222  BP: (!) 150/83  Pulse: 63  Resp: 20  Temp: 97.9 F (36.6 C)  SpO2: 100%   KPS: 80. General: Alert, cooperative, pleasant, in no acute distress Head: Craniotomy scar noted, dry and intact. EENT: No conjunctival injection or scleral icterus. Oral mucosa moist Lungs: Resp effort normal Cardiac: Regular rate and rhythm Abdomen: Soft, non-distended abdomen Skin: No rashes cyanosis or petechiae. Extremities: No clubbing or edema  Neurologic Exam: Mental Status: Awake, alert, attentive to examiner. Oriented to self and environment. Subtle mixed dysphasia motor>sensory. Cranial Nerves: Visual acuity is grossly normal. Right homonymous hemianopia. Extra-ocular movements intact. No ptosis. Face is symmetric, tongue midline. Motor: Tone and bulk are normal. Power is full in both  arms and legs. Reflexes are symmetric, no pathologic reflexes present. Intact finger to nose bilaterally Sensory: Intact to light touch and temperature Gait: Deferred  Labs: I have reviewed the data as listed    Component Value Date/Time   NA 142 06/12/2019 1211   K 3.7 06/12/2019 1211   CL 109 06/12/2019 1211   CO2 23 06/12/2019 1211   GLUCOSE 131 (H) 06/12/2019 1211   BUN 10 06/12/2019 1211   CREATININE 1.29 (H) 06/12/2019 1211   CALCIUM 8.9 06/12/2019 1211   PROT 6.5 06/12/2019 1211   ALBUMIN 3.8 06/12/2019 1211   AST 30 06/12/2019 1211   ALT 25 06/12/2019 1211   ALKPHOS 68 06/12/2019 1211   BILITOT 0.4 06/12/2019 1211   GFRNONAA >60 06/12/2019 1211   GFRAA >60 06/12/2019 1211   Lab Results  Component Value Date   WBC 9.6 06/26/2019    NEUTROABS 8.4 (H) 06/26/2019   HGB 15.8 06/26/2019   HCT 45.5 06/26/2019   MCV 85.7 06/26/2019   PLT 106 (L) 06/26/2019    Assessment/Plan Glioblastoma ([place])  Focal seizures ([place])  Mr. Stofko is clinically stable today.  He is cleared for avastin infusion 48m/kg.   We recommended continuing treatment with cycle #1 (day 15/42) CCNU 965mm2, given once every 6 weeks. The patient will have a complete blood count performed on days 14 and 28 of each cycle, and a comprehensive metabolic panel performed on day 14 and 28 of each cycle. Labs may need to be performed more often. Zofran will prescribed for home use for nausea/vomiting.  We counseled him on the side effects including nausea/vomiting, cytopenias, rare idiopathic pulmonary fibrosis.   Will continue Avastin 101mg q2 weeks as concurrent therapy due to good control of enhancing volume.    Avastin should be held for the following:  ANC less than 500  Platelets less than 50,000  LFT or creatinine greater than 2x ULN  If clinical concerns/contraindications develop  Chemotherapy should be held for the following:  ANC less than 1,000  Platelets less than 100,000  LFT or creatinine greater than 2x ULN  If clinical concerns/contraindications develop  Recommended continuing decadron 2mg20mily.  We appreciate the opportunity to participate in the care of Cody [name] He will return to clinic in 2 weeks for next avastin infusion and labs.  MRI brain will be in 4 weeks.  All questions were answered. The patient knows to call the clinic with any problems, questions or concerns. No barriers to learning were detected.  The total time spent in the encounter was 30 minutes and more than 50% was on counseling and review of test results   ZachVentura Sellers Medical Director of Neuro-Oncology [facility] Inc[facility]esl[facility]oc24/21 12:27 PM

## 2019-06-27 ENCOUNTER — Telehealth: Payer: Self-pay | Admitting: Internal Medicine

## 2019-06-27 NOTE — Telephone Encounter (Signed)
Appt already scheduled per 5/24 los

## 2019-07-10 ENCOUNTER — Inpatient Hospital Stay: Payer: BC Managed Care – PPO

## 2019-07-10 ENCOUNTER — Inpatient Hospital Stay: Payer: BC Managed Care – PPO | Attending: Internal Medicine | Admitting: Internal Medicine

## 2019-07-10 ENCOUNTER — Other Ambulatory Visit: Payer: Self-pay

## 2019-07-10 VITALS — BP 144/93 | HR 66 | Temp 97.5°F | Resp 18 | Ht 72.0 in | Wt 189.4 lb

## 2019-07-10 DIAGNOSIS — Z79899 Other long term (current) drug therapy: Secondary | ICD-10-CM | POA: Insufficient documentation

## 2019-07-10 DIAGNOSIS — C719 Malignant neoplasm of brain, unspecified: Secondary | ICD-10-CM

## 2019-07-10 DIAGNOSIS — Z9221 Personal history of antineoplastic chemotherapy: Secondary | ICD-10-CM | POA: Insufficient documentation

## 2019-07-10 DIAGNOSIS — C714 Malignant neoplasm of occipital lobe: Secondary | ICD-10-CM | POA: Insufficient documentation

## 2019-07-10 DIAGNOSIS — R569 Unspecified convulsions: Secondary | ICD-10-CM | POA: Insufficient documentation

## 2019-07-10 DIAGNOSIS — R5383 Other fatigue: Secondary | ICD-10-CM | POA: Insufficient documentation

## 2019-07-10 DIAGNOSIS — D696 Thrombocytopenia, unspecified: Secondary | ICD-10-CM | POA: Insufficient documentation

## 2019-07-10 DIAGNOSIS — Z5112 Encounter for antineoplastic immunotherapy: Secondary | ICD-10-CM | POA: Insufficient documentation

## 2019-07-10 LAB — CMP (CANCER CENTER ONLY)
ALT: 16 U/L (ref 0–44)
AST: 11 U/L — ABNORMAL LOW (ref 15–41)
Albumin: 3.6 g/dL (ref 3.5–5.0)
Alkaline Phosphatase: 81 U/L (ref 38–126)
Anion gap: 10 (ref 5–15)
BUN: 12 mg/dL (ref 6–20)
CO2: 22 mmol/L (ref 22–32)
Calcium: 8.9 mg/dL (ref 8.9–10.3)
Chloride: 108 mmol/L (ref 98–111)
Creatinine: 1.26 mg/dL — ABNORMAL HIGH (ref 0.61–1.24)
GFR, Est AFR Am: >60 mL/min (ref >60)
GFR, Estimated: >60 mL/min (ref >60)
Glucose, Bld: 117 mg/dL — ABNORMAL HIGH (ref 70–99)
Potassium: 4.2 mmol/L (ref 3.5–5.1)
Sodium: 140 mmol/L (ref 135–145)
Total Bilirubin: 0.9 mg/dL (ref 0.3–1.2)
Total Protein: 6.4 g/dL — ABNORMAL LOW (ref 6.5–8.1)

## 2019-07-10 LAB — CBC WITH DIFFERENTIAL (CANCER CENTER ONLY)
Abs Immature Granulocytes: 0.01 10*3/uL (ref 0.00–0.07)
Basophils Absolute: 0 10*3/uL (ref 0.0–0.1)
Basophils Relative: 0 %
Eosinophils Absolute: 0 10*3/uL (ref 0.0–0.5)
Eosinophils Relative: 0 %
HCT: 44.1 % (ref 39.0–52.0)
Hemoglobin: 15.5 g/dL (ref 13.0–17.0)
Immature Granulocytes: 0 %
Lymphocytes Relative: 9 %
Lymphs Abs: 0.8 10*3/uL (ref 0.7–4.0)
MCH: 29.9 pg (ref 26.0–34.0)
MCHC: 35.1 g/dL (ref 30.0–36.0)
MCV: 85.1 fL (ref 80.0–100.0)
Monocytes Absolute: 0.4 10*3/uL (ref 0.1–1.0)
Monocytes Relative: 5 %
Neutro Abs: 7 10*3/uL (ref 1.7–7.7)
Neutrophils Relative %: 86 %
Platelet Count: 43 10*3/uL — ABNORMAL LOW (ref 150–400)
RBC: 5.18 MIL/uL (ref 4.22–5.81)
RDW: 14 % (ref 11.5–15.5)
WBC Count: 8.2 10*3/uL (ref 4.0–10.5)
nRBC: 0 % (ref 0.0–0.2)

## 2019-07-10 LAB — TOTAL PROTEIN, URINE DIPSTICK: Protein, ur: NEGATIVE mg/dL

## 2019-07-10 NOTE — Progress Notes (Signed)
[facility] [place], [place] [address] [phone]   Interval Evaluation  Date of Service: 07/10/19 Patient Name: Cody [name] Patient MRN: [id number] Patient DOB: May 06, 1960 Provider: Ventura Sellers, MD  Identifying Statement:  Cody [name] is a 59 y.o. male with left occipital glioblastoma   Oncologic History: Oncology History  Glioblastoma ([place])  09/29/2018 Surgery   Craniotomy, resection with Dr. Kathyrn Sheriff   10/21/2018 - 10/31/2018 Chemotherapy   The patient had [No matching medication found in this treatment plan]  for chemotherapy treatment.    01/09/2019 - 05/18/2019 Chemotherapy   The patient had [No matching medication found in this treatment plan]  for chemotherapy treatment.    01/23/2019 -  Chemotherapy   The patient had bevacizumab-bvzr (ZIRABEV) 800 mg in sodium chloride 0.9 % 100 mL chemo infusion, 10 mg/kg = 800 mg, Intravenous,  Once, 11 of 12 cycles Administration: 800 mg (01/23/2019), 800 mg (02/09/2019), 900 mg (02/24/2019), 900 mg (03/10/2019), 900 mg (03/24/2019), 900 mg (04/07/2019), 900 mg (04/21/2019), 900 mg (05/05/2019), 900 mg (05/30/2019), 900 mg (06/26/2019), 900 mg (06/12/2019)  for chemotherapy treatment.    05/30/2019 -  Chemotherapy   The patient had [No matching medication found in this treatment plan]  for chemotherapy treatment.      Biomarkers:  MGMT Unknown.  IDH 1/2 Wild type.  EGFR Unknown  TERT Unknown   Interval History:  Cody [name] presents today for avastin infusion.  Headaches have stabilized.  Word finding difficulty might be somewhat worsened from prior.  Fatigue and lethargy have been increased, he is currently sleeping 12+ hours per day. Vision on the right side is still impaired as prior. No issues with gait or motor control. He has had no recurrent seizure events.  Continues on 64m daily decadron.  H+P (10/18/18) Patient presented to medical attention in  August 2020 with several weeks history of new onset headaches.  Progressive nature of symptoms led to an ED admission, where CNS imaging demonstrated enhancing mass in the left occipital lobe.  He underwent craniotomy and resection with Dr. [name] 88/33/38without complication.  He was discharged to home with small visual field impairment and otherwise functionally intact.  He denies headaches or any seizures since surgery, having completed decadron taper.    Medications: Current Outpatient Medications on File Prior to Visit  Medication Sig Dispense Refill  . dexamethasone (DECADRON) 2 MG tablet Take 2 mg by mouth daily.    . finasteride (PROSCAR) 5 MG tablet Take 5 mg by mouth daily.    .Marland KitchenGLEOSTINE 10 MG capsule Take 10 mg by mouth once. CHEMO every 42 days    . GLEOSTINE 100 MG capsule Take 100 mg by mouth once. CHEMO, every 42 days    . GLEOSTINE 40 MG capsule Take 2 capsules by mouth See admin instructions. CHEMO, every 42 days    . levETIRAcetam (KEPPRA) 500 MG tablet TAKE 2 TABLETS(1000 MG) BY MOUTH TWICE DAILY 360 tablet 1  . tamsulosin (FLOMAX) 0.4 MG CAPS capsule Take 0.4 mg by mouth daily.    . traMADol (ULTRAM) 50 MG tablet TAKE 1 TABLET(50 MG) BY MOUTH EVERY 6 HOURS AS NEEDED FOR SEVERE PAIN (Patient not taking: Reported on 06/12/2019) 30 tablet 1   No current facility-administered medications on file prior to visit.    Allergies: No Known Allergies Past Medical History:  Past Medical History:  Diagnosis Date  . Enlarged prostate   .  Migraines    Past Surgical History:  Past Surgical History:  Procedure Laterality Date  . APPLICATION OF CRANIAL NAVIGATION Left 09/29/2018   Procedure: APPLICATION OF CRANIAL NAVIGATION;  Surgeon: [place] Lose, MD;  Location: [facility];  Service: Neurosurgery;  Laterality: Left;  . CRANIOTOMY Left 09/29/2018   Procedure: Sterotactic Left craniotomy for resection of tumor;  Surgeon: Cody Lose, MD;  Location: [facility];  Service:  Neurosurgery;  Laterality: Left;  Sterotactic Left craniotomy for resection of tumor   Social History:  Social History   Socioeconomic History  . Marital status: Married    Spouse name: Not on file  . Number of children: Not on file  . Years of education: Not on file  . Highest education level: Not on file  Occupational History  . Not on file  Tobacco Use  . Smoking status: Never Smoker  . Smokeless tobacco: Never Used  Substance and Sexual Activity  . Alcohol use: Never  . Drug use: Never  . Sexual activity: Not on file  Other Topics Concern  . Not on file  Social History Narrative  . Not on file   Social Determinants of Health   Financial Resource Strain:   . Difficulty of Paying Living Expenses:   Food Insecurity:   . Worried About Charity fundraiser in the Last Year:   . Arboriculturist in the Last Year:   Transportation Needs: Unmet Transportation Needs  . Lack of Transportation (Medical): Yes  . Lack of Transportation (Non-Medical): No  Physical Activity:   . Days of Exercise per Week:   . Minutes of Exercise per Session:   Stress:   . Feeling of Stress :   Social Connections:   . Frequency of Communication with Friends and Family:   . Frequency of Social Gatherings with Friends and Family:   . Attends Religious Services:   . Active Member of Clubs or Organizations:   . Attends Archivist Meetings:   [name] Marital Status:   Intimate Partner Violence: Not At Risk  . Fear of Current or Ex-Partner: No  . Emotionally Abused: No  . Physically Abused: No  . Sexually Abused: No   Family History: No family history on file.  Review of Systems: Constitutional: Denies fevers, chills or abnormal weight loss Eyes: Denies blurriness of vision Ears, nose, mouth, throat, and face: Denies mucositis or sore throat Respiratory: Denies cough, dyspnea or wheezes Cardiovascular: Denies palpitation, chest discomfort or lower extremity swelling Gastrointestinal:   Denies nausea, constipation, diarrhea GU: Denies dysuria or incontinence Skin: Denies abnormal skin rashes Neurological: Per HPI Musculoskeletal: Denies joint pain, back or neck discomfort. No decrease in ROM Behavioral/Psych: Denies anxiety, disturbance in thought content, and mood instability  Physical Exam: Vitals:   07/10/19 1152  BP: (!) 144/93  Pulse: 66  Resp: 18  Temp: (!) 97.5 F (36.4 C)  SpO2: 100%   KPS: 80. General: Alert, cooperative, pleasant, in no acute distress Head: Craniotomy scar noted, dry and intact. EENT: No conjunctival injection or scleral icterus. Oral mucosa moist Lungs: Resp effort normal Cardiac: Regular rate and rhythm Abdomen: Soft, non-distended abdomen Skin: No rashes cyanosis or petechiae. Extremities: No clubbing or edema  Neurologic Exam: Mental Status: Awake, alert, attentive to examiner. Oriented to self and environment. Subtle mixed dysphasia motor>sensory. Cranial Nerves: Visual acuity is grossly normal. Right homonymous hemianopia. Extra-ocular movements intact. No ptosis. Face is symmetric, tongue midline. Motor: Tone and bulk are normal. Power is full in  both arms and legs. Reflexes are symmetric, no pathologic reflexes present. Intact finger to nose bilaterally Sensory: Intact to light touch and temperature Gait: Deferred  Labs: I have reviewed the data as listed    Component Value Date/Time   NA 142 06/26/2019 1210   K 4.0 06/26/2019 1210   CL 107 06/26/2019 1210   CO2 26 06/26/2019 1210   GLUCOSE 125 (H) 06/26/2019 1210   BUN 13 06/26/2019 1210   CREATININE 1.29 (H) 06/26/2019 1210   CALCIUM 8.5 (L) 06/26/2019 1210   PROT 6.3 (L) 06/26/2019 1210   ALBUMIN 3.6 06/26/2019 1210   AST 14 (L) 06/26/2019 1210   ALT 23 06/26/2019 1210   ALKPHOS 99 06/26/2019 1210   BILITOT 0.4 06/26/2019 1210   GFRNONAA >60 06/26/2019 1210   GFRAA >60 06/26/2019 1210   Lab Results  Component Value Date   WBC 9.6 06/26/2019   NEUTROABS  8.4 (H) 06/26/2019   HGB 15.8 06/26/2019   HCT 45.5 06/26/2019   MCV 85.7 06/26/2019   PLT 106 (L) 06/26/2019    Assessment/Plan Glioblastoma ([place])  Focal seizures ([place])  Mr. Groleau is clinically stable today.  Because of thrombocytopenia we will defer todays avastin infusion 23m/kg.   We recommended continuing treatment with cycle #1 (day 29/42) CCNU 950mm2, given once every 6 weeks. The patient will have a complete blood count performed on days 14 and 28 of each cycle, and a comprehensive metabolic panel performed on day 14 and 28 of each cycle. Labs may need to be performed more often. Zofran will prescribed for home use for nausea/vomiting.  We counseled him on the side effects including nausea/vomiting, cytopenias, rare idiopathic pulmonary fibrosis.   Will plan to resume Avastin 1013mg in 2 weeks if labs return to therapeutic range  Avastin should be held for the following:  ANC less than 500  Platelets less than 50,000  LFT or creatinine greater than 2x ULN  If clinical concerns/contraindications develop  Chemotherapy should be held for the following:  ANC less than 1,000  Platelets less than 100,000  LFT or creatinine greater than 2x ULN  If clinical concerns/contraindications develop  Recommended continuing decadron 1mg86mily.  We appreciate the opportunity to participate in the care of MichISAAK DELMUNDO He will return to clinic in 2 weeks with MRI brain for evaluation.  All questions were answered. The patient knows to call the clinic with any problems, questions or concerns. No barriers to learning were detected.  The total time spent in the encounter was 30 minutes and more than 50% was on counseling and review of test results   ZachVentura Sellers Medical Director of Neuro-Oncology [facility]ton07/21 11:41 AM

## 2019-07-11 ENCOUNTER — Telehealth: Payer: Self-pay | Admitting: Internal Medicine

## 2019-07-11 NOTE — Telephone Encounter (Signed)
Scheduled appt per 6/7 los.  Spoke with pt spouse and she is aware of the appt date and time.

## 2019-07-17 ENCOUNTER — Other Ambulatory Visit: Payer: Self-pay | Admitting: Radiation Therapy

## 2019-07-21 ENCOUNTER — Ambulatory Visit
Admission: RE | Admit: 2019-07-21 | Discharge: 2019-07-21 | Disposition: A | Discharge status: Home / Self Care | Payer: BC Managed Care – PPO | Source: Ambulatory Visit | Attending: Internal Medicine | Admitting: Internal Medicine

## 2019-07-21 ENCOUNTER — Other Ambulatory Visit: Payer: Self-pay

## 2019-07-21 DIAGNOSIS — C719 Malignant neoplasm of brain, unspecified: Secondary | ICD-10-CM

## 2019-07-21 IMAGING — MR MR HEAD WO/W CM
13 series · 48 of 48 positions shown · IV contrast (multihance)
Comparison: MRI head [DATE]

CLINICAL DATA: Glioblastoma.  Follow-up tumor.

EXAM:
MRI HEAD WITHOUT AND WITH CONTRAST
TECHNIQUE: Multiplanar, multiecho pulse sequences of the brain and surrounding
structures were obtained without and with intravenous contrast.
CONTRAST:  18 mL MultiHance IV

[Series 5: T1 · sagittal · 4.0mm · 0.75mm/px · 1 of 31 slices shown (1 of 3)]
[im 1/31]
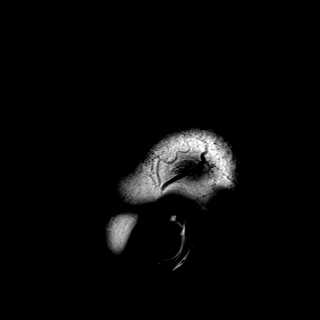

[Series 6: DWI · axial · 3.0mm · 1.50mm/px · z∈[-65,+77]mm · 4 of 88 slices shown (1 of 4)]
[im 1/88]
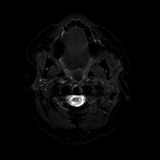
[im 30/88]
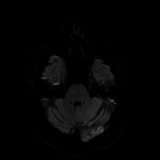
[im 59/88]
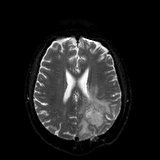
[im 88/88]
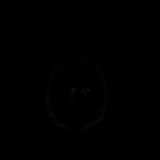

[Series 7: DWI · axial · 3.0mm · 1.50mm/px · z∈[-65,+77]mm · 3 of 44 slices shown (2 of 4)]
[im 1/44]
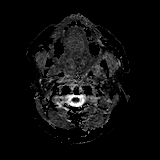
[im 22/44]
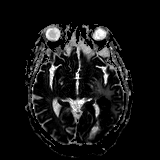
[im 44/44]
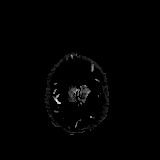

[Series 8: DWI · coronal · 5.0mm · 1.44mm/px · 4 of 65 slices shown (3 of 4)]
[im 1/65]
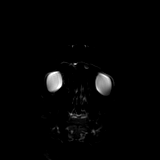
[im 22/65]
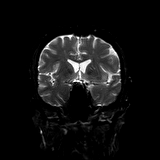
[im 43/65]
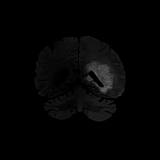
[im 65/65]
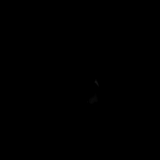

[Series 9: DWI · coronal · 5.0mm · 1.44mm/px · 2 of 33 slices shown (4 of 4)]
[im 1/33]
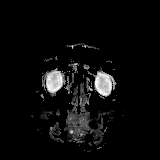
[im 33/33]
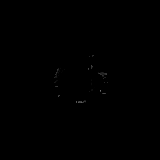

[Series 10: T2 · axial · 4.0mm · 0.36mm/px · z∈[-68,+78]mm · 2 of 29 slices shown]
[im 1/29]
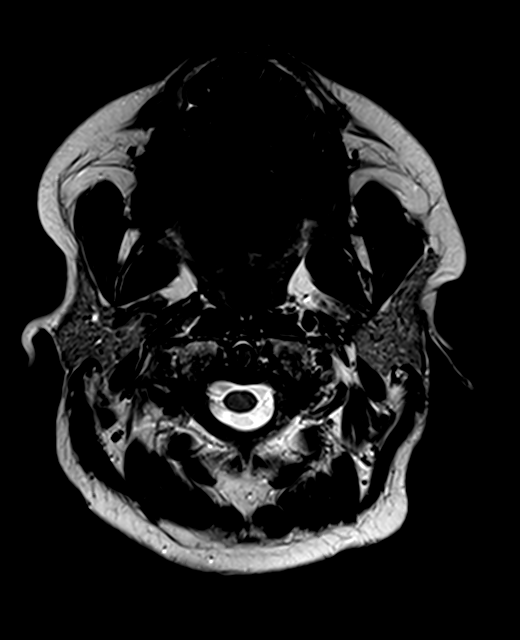
[im 29/29]
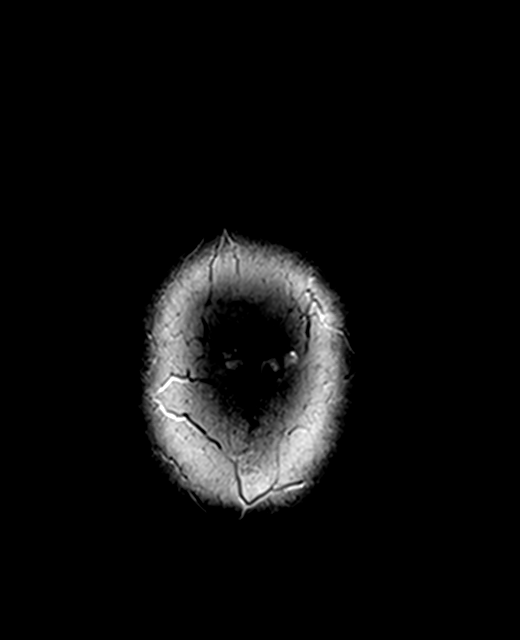

[Series 11: FLAIR · axial · 3.0mm · 0.72mm/px · z∈[-69,+81]mm · 2 of 26 slices shown]
[im 1/26]
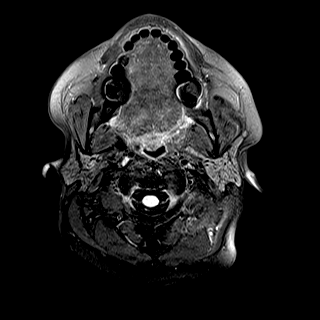
[im 26/26]
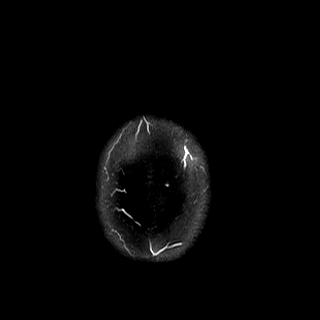

[Series 13: swi_images · axial · 1.5mm · 0.90mm/px · z∈[-66,+76]mm · 6 of 96 slices shown]
[im 1/96]
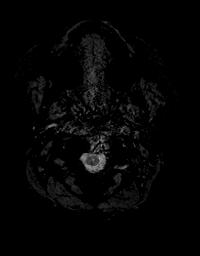
[im 20/96]
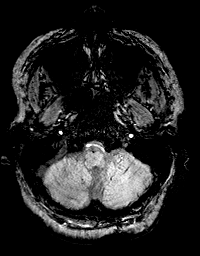
[im 39/96]
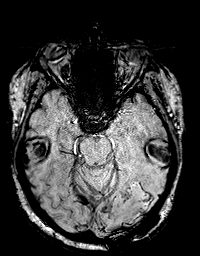
[im 58/96]
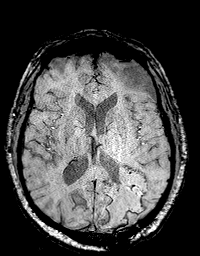
[im 77/96]
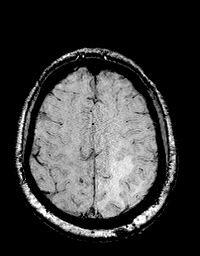
[im 96/96]
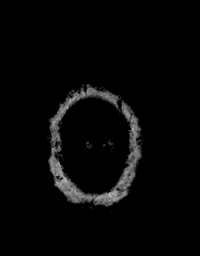

[Series 14: T1 · axial · 1.0mm · 0.94mm/px · z∈[-75,+84]mm · 9 of 160 slices shown (2 of 3)]
[im 1/160]
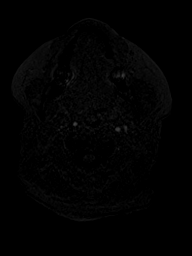
[im 20/160]
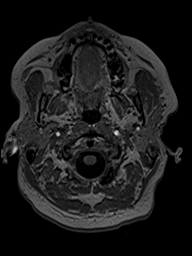
[im 40/160]
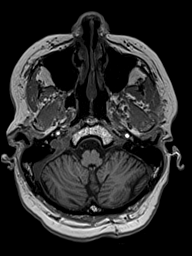
[im 60/160]
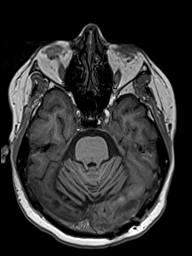
[im 80/160]
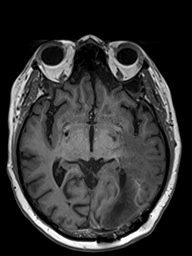
[im 100/160]
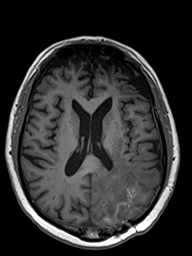
[im 120/160]
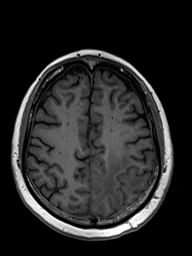
[im 140/160]
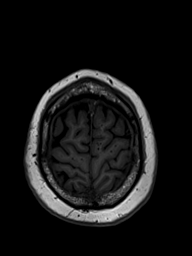
[im 160/160]
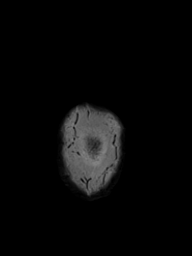

[Series 15: T2 post-contrast · coronal · 4.5mm · 0.36mm/px · 2 of 30 slices shown]
[im 1/30]
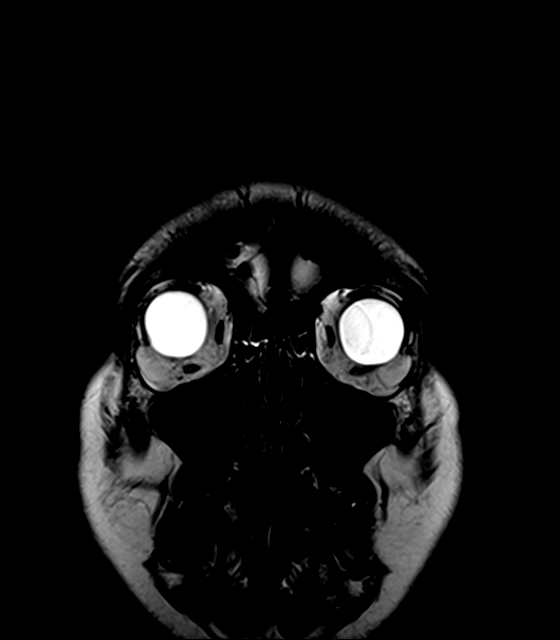
[im 30/30]
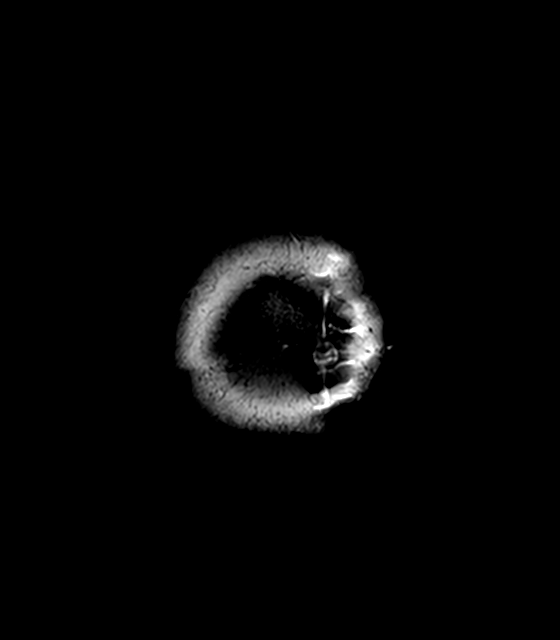

[Series 16: T1 · axial · 1.0mm · 0.94mm/px · z∈[-75,+84]mm · 9 of 160 slices shown (3 of 3)]
[im 1/160]
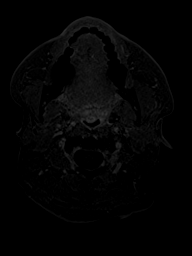
[im 20/160]
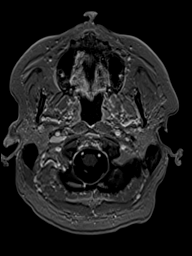
[im 40/160]
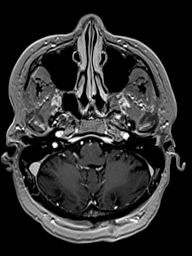
[im 60/160]
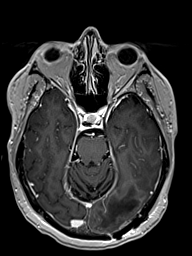
[im 80/160]
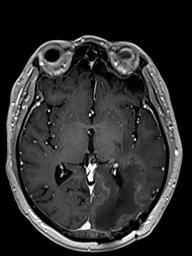
[im 100/160]
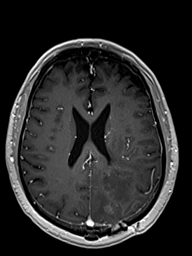
[im 120/160]
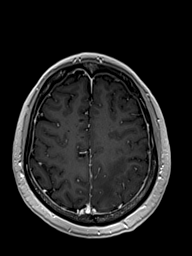
[im 140/160]
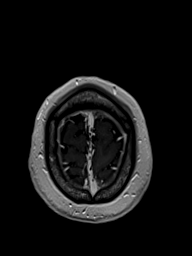
[im 160/160]
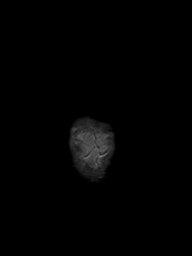

[Series 17: T1 post-contrast · coronal · 4.5mm · 0.72mm/px · 2 of 30 slices shown (1 of 2)]
[im 1/30]
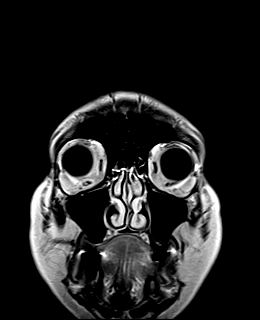
[im 30/30]
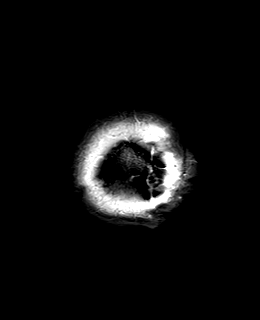

[Series 18: T1 post-contrast · sagittal · 4.0mm · 0.75mm/px · 2 of 31 slices shown (2 of 2)]
[im 1/31]
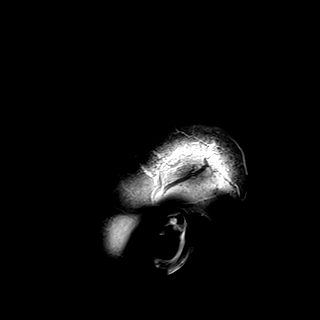
[im 31/31]
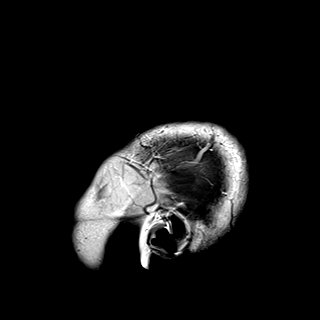

[48 of 48 positions shown; findings below may reference images not displayed]

FINDINGS: Brain: Large mass in the left occipital lobe has progressed. The
mass shows restricted diffusion and increased signal on T2. There is
a faint rim of mild enhancement surrounding the mass which is likely
due to Avastin. Surrounding nonenhancing T2 and FLAIR signal have
also progressed. Progression of T2 hyperintensity in the splenium of
the close corpus callosum, extension into the internal and external
capsule, and into the left parietal white matter. There is also
progression of tumor which appears to extend into the atrium of the
left lateral ventricle.

Ventricle size normal. Extensive encephalomalacia anterior frontal
lobes bilaterally due to chronic head trauma unchanged. No acute
infarct.

Vascular: Normal arterial flow voids.

Skull and upper cervical spine: Left occipital craniotomy.

Sinuses/Orbits: Paranasal sinuses clear.  Negative orbit

Other: None
IMPRESSION: 1. Progression of large tumor mass in the left occipital lobe. There
is mild peripheral enhancement due to Avastin treatment. There is
progression of nonenhancing T2 hyperintensity surrounding the tumor.
There is progressive tumor extension into the splenium of the corpus
callosum as well as into the left atrium of the lateral ventricle.

## 2019-07-21 MED ORDER — GADOBENATE DIMEGLUMINE 529 MG/ML IV SOLN
18.0000 mL | Freq: Once | INTRAVENOUS | Status: AC | PRN
  Administered 2019-07-21: 18 mL via INTRAVENOUS

## 2019-07-24 ENCOUNTER — Other Ambulatory Visit: Payer: Self-pay

## 2019-07-24 ENCOUNTER — Inpatient Hospital Stay: Payer: BC Managed Care – PPO

## 2019-07-24 ENCOUNTER — Inpatient Hospital Stay (HOSPITAL_BASED_OUTPATIENT_CLINIC_OR_DEPARTMENT_OTHER): Payer: BC Managed Care – PPO | Admitting: Internal Medicine

## 2019-07-24 VITALS — BP 140/97 | HR 73 | Temp 97.7°F | Resp 18 | Ht 72.0 in | Wt 188.5 lb

## 2019-07-24 VITALS — BP 149/85

## 2019-07-24 DIAGNOSIS — Z7189 Other specified counseling: Secondary | ICD-10-CM

## 2019-07-24 DIAGNOSIS — C714 Malignant neoplasm of occipital lobe: Secondary | ICD-10-CM | POA: Diagnosis not present

## 2019-07-24 DIAGNOSIS — C719 Malignant neoplasm of brain, unspecified: Secondary | ICD-10-CM

## 2019-07-24 DIAGNOSIS — R569 Unspecified convulsions: Secondary | ICD-10-CM | POA: Diagnosis not present

## 2019-07-24 LAB — CBC WITH DIFFERENTIAL (CANCER CENTER ONLY)
Abs Immature Granulocytes: 0.03 10*3/uL (ref 0.00–0.07)
Basophils Absolute: 0 10*3/uL (ref 0.0–0.1)
Basophils Relative: 0 %
Eosinophils Absolute: 0 10*3/uL (ref 0.0–0.5)
Eosinophils Relative: 0 %
HCT: 41.7 % (ref 39.0–52.0)
Hemoglobin: 14.8 g/dL (ref 13.0–17.0)
Immature Granulocytes: 1 %
Lymphocytes Relative: 32 %
Lymphs Abs: 1.2 10*3/uL (ref 0.7–4.0)
MCH: 30.1 pg (ref 26.0–34.0)
MCHC: 35.5 g/dL (ref 30.0–36.0)
MCV: 84.8 fL (ref 80.0–100.0)
Monocytes Absolute: 0.5 10*3/uL (ref 0.1–1.0)
Monocytes Relative: 12 %
Neutro Abs: 2.1 10*3/uL (ref 1.7–7.7)
Neutrophils Relative %: 55 %
Platelet Count: 192 10*3/uL (ref 150–400)
RBC: 4.92 MIL/uL (ref 4.22–5.81)
RDW: 14.9 % (ref 11.5–15.5)
WBC Count: 3.8 10*3/uL — ABNORMAL LOW (ref 4.0–10.5)
nRBC: 0 % (ref 0.0–0.2)

## 2019-07-24 LAB — CMP (CANCER CENTER ONLY)
ALT: 15 U/L (ref 0–44)
AST: 13 U/L — ABNORMAL LOW (ref 15–41)
Albumin: 3.6 g/dL (ref 3.5–5.0)
Alkaline Phosphatase: 81 U/L (ref 38–126)
Anion gap: 10 (ref 5–15)
BUN: 11 mg/dL (ref 6–20)
CO2: 24 mmol/L (ref 22–32)
Calcium: 8.9 mg/dL (ref 8.9–10.3)
Chloride: 107 mmol/L (ref 98–111)
Creatinine: 1.34 mg/dL — ABNORMAL HIGH (ref 0.61–1.24)
GFR, Est AFR Am: >60 mL/min (ref >60)
GFR, Estimated: 59 mL/min — ABNORMAL LOW (ref >60)
Glucose, Bld: 128 mg/dL — ABNORMAL HIGH (ref 70–99)
Potassium: 3.7 mmol/L (ref 3.5–5.1)
Sodium: 141 mmol/L (ref 135–145)
Total Bilirubin: 0.6 mg/dL (ref 0.3–1.2)
Total Protein: 6.6 g/dL (ref 6.5–8.1)

## 2019-07-24 LAB — TOTAL PROTEIN, URINE DIPSTICK: Protein, ur: NEGATIVE mg/dL

## 2019-07-24 MED ORDER — SODIUM CHLORIDE 0.9 % IV SOLN
10.0000 mg/kg | Freq: Once | INTRAVENOUS | Status: AC
  Administered 2019-07-24: 900 mg via INTRAVENOUS
  Filled 2019-07-24: qty 32

## 2019-07-24 MED ORDER — SODIUM CHLORIDE 0.9 % IV SOLN
Freq: Once | INTRAVENOUS | Status: AC
  Filled 2019-07-24: qty 250

## 2019-07-24 NOTE — Patient Instructions (Signed)
[facility] Discharge Instructions for Patients Receiving Chemotherapy  Today you received the following chemotherapy agents Bevacizumab  To help prevent nausea and vomiting after your treatment, we encourage you to take your nausea medication as directed   If you develop nausea and vomiting that is not controlled by your nausea medication, call the clinic.   BELOW ARE SYMPTOMS THAT SHOULD BE REPORTED IMMEDIATELY:  *FEVER GREATER THAN 100.5 F  *CHILLS WITH OR WITHOUT FEVER  NAUSEA AND VOMITING THAT IS NOT CONTROLLED WITH YOUR NAUSEA MEDICATION  *UNUSUAL SHORTNESS OF BREATH  *UNUSUAL BRUISING OR BLEEDING  TENDERNESS IN MOUTH AND THROAT WITH OR WITHOUT PRESENCE OF ULCERS  *URINARY PROBLEMS  *BOWEL PROBLEMS  UNUSUAL RASH Items with * indicate a potential emergency and should be followed up as soon as possible.  Feel free to call the clinic should you have any questions or concerns. The clinic phone number is [phone].  Please show the CHEMO ALERT CARD at check-in to the Emergency Department and triage nurse.   

## 2019-07-24 NOTE — Progress Notes (Signed)
DISCONTINUE ON PATHWAY REGIMEN - Neuro     A cycle is every 42 days:     Lomustine   **Always confirm dose/schedule in your pharmacy ordering system**  REASON: Disease Progression PRIOR TREATMENT: [id number]: Lomustine 110 mg/m2 D1 q42 Days TREATMENT RESPONSE: Progressive Disease (PD)  START OFF PATHWAY REGIMEN - Neuro   [id number]:Carboplatin AUC=5 q21 Days:   A cycle is every 21 days:     Carboplatin   **Always confirm dose/schedule in your pharmacy ordering system**  Patient Characteristics: Glioblastoma (Grade IV Glioma), Recurrent or Progressive, Nonsurgical Candidate, Systemic Therapy Candidate Disease Classification: Glioma Disease Classification: Glioblastoma (Grade IV Glioma) Disease Status: Recurrent or Progressive Treatment Classification: Nonsurgical Candidate Treatment (Nonsurgical/Adjuvant): Systemic Therapy Candidate Intent of Therapy: Non-Curative / Palliative Intent, Discussed with Patient

## 2019-07-24 NOTE — Progress Notes (Signed)
[name] [place], [place] [address] [phone]   Interval Evaluation  Date of Service: 07/24/19 Patient Name: Cody [name] Patient MRN: [id number] Patient DOB: February 17, 1960 Provider: Ventura Sellers, MD  Identifying Statement:  Cody [name] is a 59 y.o. male with left occipital glioblastoma   Oncologic History: Oncology History  Glioblastoma ([place])  09/29/2018 Surgery   Craniotomy, resection with Dr. Kathyrn Sheriff   10/21/2018 - 10/31/2018 Chemotherapy   The patient had [No matching medication found in this treatment plan]  for chemotherapy treatment.    01/09/2019 - 05/18/2019 Chemotherapy   The patient had [No matching medication found in this treatment plan]  for chemotherapy treatment.    01/23/2019 -  Chemotherapy   The patient had bevacizumab-bvzr (ZIRABEV) 800 mg in sodium chloride 0.9 % 100 mL chemo infusion, 10 mg/kg = 800 mg, Intravenous,  Once, 11 of 12 cycles Administration: 800 mg (01/23/2019), 800 mg (02/09/2019), 900 mg (02/24/2019), 900 mg (03/10/2019), 900 mg (03/24/2019), 900 mg (04/07/2019), 900 mg (04/21/2019), 900 mg (05/05/2019), 900 mg (05/30/2019), 900 mg (06/26/2019), 900 mg (06/12/2019)  for chemotherapy treatment.    05/30/2019 -  Chemotherapy   The patient had [No matching medication found in this treatment plan]  for chemotherapy treatment.      Biomarkers:  MGMT Unknown.  IDH 1/2 Wild type.  EGFR Unknown  TERT Unknown   Interval History:  Cody [name] presents today for avastin infusion.  Word finding difficulty has progressed from two weeks prior.  Although he denies weakness, there is more unsteady feeling with walking, which has slowed down some. Fatigue and lethargy are stable, he is currently sleeping 12+ hours per day. Vision on the right side is still impaired as prior. He has had no recurrent seizure events.  Continues on 107m daily decadron after recurrent headaches on 175m dose.  H+P (10/18/18) Patient presented to medical attention in August 2020 with several weeks history of new onset headaches.  Progressive nature of symptoms led to an ED admission, where CNS imaging demonstrated enhancing mass in the left occipital lobe.  He underwent craniotomy and resection with Dr. NuKathyrn Sheriffn 09/03/68/35ithout complication.  He was discharged to home with small visual field impairment and otherwise functionally intact.  He denies headaches or any seizures since surgery, having completed decadron taper.    Medications: Current Outpatient Medications on File Prior to Visit  Medication Sig Dispense Refill  . dexamethasone (DECADRON) 2 MG tablet Take 2 mg by mouth daily.    . finasteride (PROSCAR) 5 MG tablet Take 5 mg by mouth daily.    . Marland KitchenLEOSTINE 10 MG capsule Take 10 mg by mouth once. CHEMO every 42 days    . GLEOSTINE 100 MG capsule Take 100 mg by mouth once. CHEMO, every 42 days    . GLEOSTINE 40 MG capsule Take 2 capsules by mouth See admin instructions. CHEMO, every 42 days    . levETIRAcetam (KEPPRA) 500 MG tablet TAKE 2 TABLETS(1000 MG) BY MOUTH TWICE DAILY 360 tablet 1  . tamsulosin (FLOMAX) 0.4 MG CAPS capsule Take 0.4 mg by mouth daily.    . traMADol (ULTRAM) 50 MG tablet TAKE 1 TABLET(50 MG) BY MOUTH EVERY 6 HOURS AS NEEDED FOR SEVERE PAIN (Patient not taking: Reported on 06/12/2019) 30 tablet 1   No current facility-administered medications on file prior to visit.    Allergies: No Known Allergies Past Medical History:  Past Medical History:  Diagnosis Date  . Enlarged prostate   . Migraines    Past Surgical History:  Past Surgical History:  Procedure Laterality Date  . APPLICATION OF CRANIAL NAVIGATION Left 09/29/2018   Procedure: APPLICATION OF CRANIAL NAVIGATION;  Surgeon: [name], MD;  Location: [facility];  Service: Neurosurgery;  Laterality: Left;  . CRANIOTOMY Left 09/29/2018   Procedure: Sterotactic Left craniotomy for resection of tumor;   Surgeon: [name], MD;  Location: [facility];  Service: Neurosurgery;  Laterality: Left;  Sterotactic Left craniotomy for resection of tumor   Social History:  Social History   Socioeconomic History  . Marital status: Married    Spouse name: Not on file  . Number of children: Not on file  . Years of education: Not on file  . Highest education level: Not on file  Occupational History  . Not on file  Tobacco Use  . Smoking status: Never Smoker  . Smokeless tobacco: Never Used  Vaping Use  . Vaping Use: Never used  Substance and Sexual Activity  . Alcohol use: Never  . Drug use: Never  . Sexual activity: Not on file  Other Topics Concern  . Not on file  Social History Narrative  . Not on file   Social Determinants of Health   Financial Resource Strain:   . Difficulty of Paying Living Expenses:   Food Insecurity:   . Worried About Charity fundraiser in the Last Year:   . Arboriculturist in the Last Year:   Transportation Needs: Unmet Transportation Needs  . Lack of Transportation (Medical): Yes  . Lack of Transportation (Non-Medical): No  Physical Activity:   . Days of Exercise per Week:   . Minutes of Exercise per Session:   Stress:   . Feeling of Stress :   Social Connections:   . Frequency of Communication with Friends and Family:   . Frequency of Social Gatherings with Friends and Family:   . Attends Religious Services:   . Active Member of Clubs or Organizations:   . Attends Archivist Meetings:   [name] Marital Status:   Intimate Partner Violence: Not At Risk  . Fear of Current or Ex-Partner: No  . Emotionally Abused: No  . Physically Abused: No  . Sexually Abused: No   Family History: No family history on file.  Review of Systems: Constitutional: Denies fevers, chills or abnormal weight loss Eyes: Denies blurriness of vision Ears, nose, mouth, throat, and face: Denies mucositis or sore throat Respiratory: Denies cough, dyspnea or  wheezes Cardiovascular: Denies palpitation, chest discomfort or lower extremity swelling Gastrointestinal:  Denies nausea, constipation, diarrhea GU: Denies dysuria or incontinence Skin: Denies abnormal skin rashes Neurological: Per HPI Musculoskeletal: Denies joint pain, back or neck discomfort. No decrease in ROM Behavioral/Psych: Denies anxiety, disturbance in thought content, and mood instability  Physical Exam: Vitals:   07/24/19 1147  BP: (!) 140/97  Pulse: 73  Resp: 18  Temp: 97.7 F (36.5 C)  SpO2: 100%   KPS: 80. General: Alert, cooperative, pleasant, in no acute distress Head: Craniotomy scar noted, dry and intact. EENT: No conjunctival injection or scleral icterus. Oral mucosa moist Lungs: Resp effort normal Cardiac: Regular rate and rhythm Abdomen: Soft, non-distended abdomen Skin: No rashes cyanosis or petechiae. Extremities: No clubbing or edema  Neurologic Exam: Mental Status: Awake, alert, attentive to examiner. Oriented to self and environment. Moderate mixed dysphasia motor>sensory. Cranial Nerves: Visual acuity is grossly normal. Right homonymous hemianopia. Extra-ocular movements intact.  No ptosis. Face is symmetric, tongue midline. Motor: Tone and bulk are normal. Power is full in both arms and legs. Reflexes are symmetric, no pathologic reflexes present. Intact finger to nose bilaterally Sensory: Intact to light touch and temperature Gait: Wide based  Labs: I have reviewed the data as listed    Component Value Date/Time   NA 140 07/10/2019 1125   K 4.2 07/10/2019 1125   CL 108 07/10/2019 1125   CO2 22 07/10/2019 1125   GLUCOSE 117 (H) 07/10/2019 1125   BUN 12 07/10/2019 1125   CREATININE 1.26 (H) 07/10/2019 1125   CALCIUM 8.9 07/10/2019 1125   PROT 6.4 (L) 07/10/2019 1125   ALBUMIN 3.6 07/10/2019 1125   AST 11 (L) 07/10/2019 1125   ALT 16 07/10/2019 1125   ALKPHOS 81 07/10/2019 1125   BILITOT 0.9 07/10/2019 1125   GFRNONAA >60 07/10/2019  1125   GFRAA >60 07/10/2019 1125   Lab Results  Component Value Date   WBC 3.8 (L) 07/24/2019   NEUTROABS 2.1 07/24/2019   HGB 14.8 07/24/2019   HCT 41.7 07/24/2019   MCV 84.8 07/24/2019   PLT 192 07/24/2019   Imaging:  [facility] Clinician Interpretation: I have personally reviewed the CNS images as listed.  My interpretation, in the context of the patient's clinical presentation, is progressive disease  MR BRAIN W WO CONTRAST  Result Date: 07/21/2019 CLINICAL DATA:  Glioblastoma.  Follow-up tumor. EXAM: MRI HEAD WITHOUT AND WITH CONTRAST TECHNIQUE: Multiplanar, multiecho pulse sequences of the brain and surrounding structures were obtained without and with intravenous contrast. CONTRAST:  18 mL MultiHance IV COMPARISON:  MRI head 05/27/2019 FINDINGS: Brain: Large mass in the left occipital lobe has progressed. The mass shows restricted diffusion and increased signal on T2. There is a faint rim of mild enhancement surrounding the mass which is likely due to Avastin. Surrounding nonenhancing T2 and FLAIR signal have also progressed. Progression of T2 hyperintensity in the splenium of the close corpus callosum, extension into the internal and external capsule, and into the left parietal white matter. There is also progression of tumor which appears to extend into the atrium of the left lateral ventricle. Ventricle size normal. Extensive encephalomalacia anterior frontal lobes bilaterally due to chronic head trauma unchanged. No acute infarct. Vascular: Normal arterial flow voids. Skull and upper cervical spine: Left occipital craniotomy. Sinuses/Orbits: Paranasal sinuses clear.  Negative orbit Other: None IMPRESSION: 1. Progression of large tumor mass in the left occipital lobe. There is mild peripheral enhancement due to Avastin treatment. There is progression of nonenhancing T2 hyperintensity surrounding the tumor. There is progressive tumor extension into the splenium of the corpus callosum as well as  into the left atrium of the lateral ventricle. Electronically Signed   By: Franchot Gallo M.D.   On: 07/21/2019 17:07    Assessment/Plan Glioblastoma ([place])  Goals of care, counseling/discussion  Focal seizures [facility])  Cody [name] is clinically and radiographically progressive today.     We recommended discontinuing treatment with CCNU 18m/m2 given progressive disease.  We discussed alternate salvage chemotherapy, recommending carboplatin AUC 4 IV q4 weeks as best available remaining option.  Reviewed side effects of carboplatin including nausea/vomiting, cytopenias, infusion reaction.  We also discussed goals of care and gave consideration for palliative/hospice.  Because labs have normalized he will proceed with todays avastin infusion 141mkg.  Enhancing burden of disease has been very well controlled.  Avastin should be held for the following:  ANC less than 500  Platelets less than  50,000  LFT or creatinine greater than 2x ULN  If clinical concerns/contraindications develop  Recommended continuing decadron 68m daily.  We appreciate the opportunity to participate in the care of Cody [name]  He and his wife will reach out to [facility] mychart with decision regarding additional chemotherapy vs hospice referral.   In the meantime will plan return to clinic in 2 weeks for next planned avastin.  All questions were answered. The patient knows to call the clinic with any problems, questions or concerns. No barriers to learning were detected.  The total time spent in the encounter was 40 minutes and more than 50% was on counseling and review of test results   ZVentura Sellers MD Medical Director of Neuro-Oncology [facility] [place] [place]06/21/21 11:40 AM

## 2019-07-25 ENCOUNTER — Telehealth: Payer: Self-pay | Admitting: Internal Medicine

## 2019-07-25 NOTE — Telephone Encounter (Signed)
Scheduled per 6/21 los. Pt is aware of appts. Will be mailing appt calendar as well per pt request.

## 2019-07-31 ENCOUNTER — Telehealth: Payer: Self-pay | Admitting: *Deleted

## 2019-07-31 NOTE — Telephone Encounter (Signed)
Called patients wife to discuss if decisions had been made by them to add Carboplatin onto his treatment plan for his next infusion that is on 08/08/2019.  She stated that patient decided to not add Carboplatin onto his treatment plan and just wants to keep Avastin for right now.  No further decisions made beyond Avastin treatment.  Message routed to MD.

## 2019-08-03 ENCOUNTER — Other Ambulatory Visit: Payer: Self-pay | Admitting: Radiation Therapy

## 2019-08-08 ENCOUNTER — Other Ambulatory Visit: Payer: Self-pay

## 2019-08-08 ENCOUNTER — Inpatient Hospital Stay: Payer: BC Managed Care – PPO

## 2019-08-08 ENCOUNTER — Other Ambulatory Visit: Payer: Self-pay | Admitting: *Deleted

## 2019-08-08 ENCOUNTER — Inpatient Hospital Stay: Payer: BC Managed Care – PPO | Attending: Internal Medicine | Admitting: Internal Medicine

## 2019-08-08 VITALS — BP 152/93 | HR 58 | Temp 98.7°F | Resp 18 | Ht 72.0 in | Wt 188.4 lb

## 2019-08-08 DIAGNOSIS — N400 Enlarged prostate without lower urinary tract symptoms: Secondary | ICD-10-CM | POA: Diagnosis not present

## 2019-08-08 DIAGNOSIS — C719 Malignant neoplasm of brain, unspecified: Secondary | ICD-10-CM

## 2019-08-08 DIAGNOSIS — Z79899 Other long term (current) drug therapy: Secondary | ICD-10-CM | POA: Insufficient documentation

## 2019-08-08 DIAGNOSIS — Z5112 Encounter for antineoplastic immunotherapy: Secondary | ICD-10-CM | POA: Insufficient documentation

## 2019-08-08 DIAGNOSIS — R5383 Other fatigue: Secondary | ICD-10-CM | POA: Diagnosis not present

## 2019-08-08 DIAGNOSIS — C714 Malignant neoplasm of occipital lobe: Secondary | ICD-10-CM | POA: Insufficient documentation

## 2019-08-08 DIAGNOSIS — Z7952 Long term (current) use of systemic steroids: Secondary | ICD-10-CM | POA: Diagnosis not present

## 2019-08-08 DIAGNOSIS — R569 Unspecified convulsions: Secondary | ICD-10-CM

## 2019-08-08 LAB — CMP (CANCER CENTER ONLY)
ALT: 17 U/L (ref 0–44)
AST: 13 U/L — ABNORMAL LOW (ref 15–41)
Albumin: 3.6 g/dL (ref 3.5–5.0)
Alkaline Phosphatase: 83 U/L (ref 38–126)
Anion gap: 11 (ref 5–15)
BUN: 16 mg/dL (ref 6–20)
CO2: 23 mmol/L (ref 22–32)
Calcium: 9.3 mg/dL (ref 8.9–10.3)
Chloride: 108 mmol/L (ref 98–111)
Creatinine: 1.28 mg/dL — ABNORMAL HIGH (ref 0.61–1.24)
GFR, Est AFR Am: >60 mL/min (ref >60)
GFR, Estimated: >60 mL/min (ref >60)
Glucose, Bld: 125 mg/dL — ABNORMAL HIGH (ref 70–99)
Potassium: 3.8 mmol/L (ref 3.5–5.1)
Sodium: 142 mmol/L (ref 135–145)
Total Bilirubin: 0.6 mg/dL (ref 0.3–1.2)
Total Protein: 6.7 g/dL (ref 6.5–8.1)

## 2019-08-08 LAB — CBC WITH DIFFERENTIAL (CANCER CENTER ONLY)
Abs Immature Granulocytes: 0.18 10*3/uL — ABNORMAL HIGH (ref 0.00–0.07)
Basophils Absolute: 0 10*3/uL (ref 0.0–0.1)
Basophils Relative: 0 %
Eosinophils Absolute: 0 10*3/uL (ref 0.0–0.5)
Eosinophils Relative: 0 %
HCT: 42.6 % (ref 39.0–52.0)
Hemoglobin: 15.1 g/dL (ref 13.0–17.0)
Immature Granulocytes: 2 %
Lymphocytes Relative: 12 %
Lymphs Abs: 1.4 10*3/uL (ref 0.7–4.0)
MCH: 29.8 pg (ref 26.0–34.0)
MCHC: 35.4 g/dL (ref 30.0–36.0)
MCV: 84.2 fL (ref 80.0–100.0)
Monocytes Absolute: 0.7 10*3/uL (ref 0.1–1.0)
Monocytes Relative: 6 %
Neutro Abs: 9.7 10*3/uL — ABNORMAL HIGH (ref 1.7–7.7)
Neutrophils Relative %: 80 %
Platelet Count: 138 10*3/uL — ABNORMAL LOW (ref 150–400)
RBC: 5.06 MIL/uL (ref 4.22–5.81)
RDW: 15.9 % — ABNORMAL HIGH (ref 11.5–15.5)
WBC Count: 12.1 10*3/uL — ABNORMAL HIGH (ref 4.0–10.5)
nRBC: 0 % (ref 0.0–0.2)

## 2019-08-08 LAB — TOTAL PROTEIN, URINE DIPSTICK: Protein, ur: NEGATIVE mg/dL

## 2019-08-08 MED ORDER — SODIUM CHLORIDE 0.9 % IV SOLN
10.0000 mg/kg | Freq: Once | INTRAVENOUS | Status: AC
  Administered 2019-08-08: 900 mg via INTRAVENOUS
  Filled 2019-08-08: qty 32

## 2019-08-08 MED ORDER — SODIUM CHLORIDE 0.9 % IV SOLN
Freq: Once | INTRAVENOUS | Status: AC
  Filled 2019-08-08: qty 250

## 2019-08-08 NOTE — Patient Instructions (Signed)
[facility] Discharge Instructions for Patients Receiving Chemotherapy  Today you received the following chemotherapy agents Bevacizumab  To help prevent nausea and vomiting after your treatment, we encourage you to take your nausea medication as directed   If you develop nausea and vomiting that is not controlled by your nausea medication, call the clinic.   BELOW ARE SYMPTOMS THAT SHOULD BE REPORTED IMMEDIATELY:  *FEVER GREATER THAN 100.5 F  *CHILLS WITH OR WITHOUT FEVER  NAUSEA AND VOMITING THAT IS NOT CONTROLLED WITH YOUR NAUSEA MEDICATION  *UNUSUAL SHORTNESS OF BREATH  *UNUSUAL BRUISING OR BLEEDING  TENDERNESS IN MOUTH AND THROAT WITH OR WITHOUT PRESENCE OF ULCERS  *URINARY PROBLEMS  *BOWEL PROBLEMS  UNUSUAL RASH Items with * indicate a potential emergency and should be followed up as soon as possible.  Feel free to call the clinic should you have any questions or concerns. The clinic phone number is [phone].  Please show the CHEMO ALERT CARD at check-in to the Emergency Department and triage nurse.   

## 2019-08-08 NOTE — Progress Notes (Signed)
[facility] [place], [place] [address] [phone]   Interval Evaluation  Date of Service: 08/08/19 [name] Name: Cody [name] [name] MRN: [id number] [name] DOB: 03-20-1960 Provider: Ventura Sellers, MD  Identifying Statement:  Cody [name] is a 59 y.o. male with left occipital glioblastoma   Oncologic History: Oncology History  Glioblastoma ([place])  09/29/2018 Surgery   Craniotomy, resection with Dr. Kathyrn Sheriff   10/21/2018 - 10/31/2018 Chemotherapy   [name] had [No matching medication found in this treatment plan]  for chemotherapy treatment.    01/09/2019 - 05/18/2019 Chemotherapy   [name] had [No matching medication found in this treatment plan]  for chemotherapy treatment.    01/23/2019 -  Chemotherapy   [name] had bevacizumab-bvzr (ZIRABEV) 800 mg in sodium chloride 0.9 % 100 mL chemo infusion, 10 mg/kg = 800 mg, Intravenous,  Once, 12 of 14 cycles Administration: 800 mg (01/23/2019), 800 mg (02/09/2019), 900 mg (02/24/2019), 900 mg (03/10/2019), 900 mg (03/24/2019), 900 mg (04/07/2019), 900 mg (04/21/2019), 900 mg (05/05/2019), 900 mg (05/30/2019), 900 mg (06/26/2019), 900 mg (06/12/2019), 900 mg (07/24/2019)  for chemotherapy treatment.    05/30/2019 - 05/30/2019 Chemotherapy   [name] had dexamethasone (DECADRON) 4 MG tablet, 1 of 1 cycle, Start date: --, End date: -- lomustine (CEENU) 10 MG capsule, 10 mg (100 % of original dose 10 mg), Oral,  Once, 1 of 1 cycle, Start date: 05/30/2019, End date: 05/30/2019 Dose modification: 10 mg (original dose 10 mg, Cycle 1) lomustine (CEENU) 100 MG capsule, 100 mg (100 % of original dose 100 mg), Oral,  Once, 1 of 1 cycle, Start date: 05/30/2019, End date: 05/30/2019 Dose modification: 100 mg (original dose 100 mg, Cycle 1) lomustine (CEENU) 40 MG capsule, 80 mg (100 % of original dose 80 mg), Oral,  Once, 1 of 1 cycle, Start date: 05/30/2019, End date: 05/30/2019 Dose  modification: 80 mg (original dose 80 mg, Cycle 1)  for chemotherapy treatment.      Biomarkers:  MGMT Unknown.  IDH 1/2 Wild type.  EGFR Unknown  TERT Unknown   Interval History:  Cody [name] presents today for clinical follow up and avastin infusion.  He has not consented to moving forward with carboplatin therapy as salvage chemo, as discussed.   Word finding difficulty has been stable overall.  Walking has not degraded. Fatigue and lethargy are stable, still sleeping 12+ hours per day. Vision on the right side is still impaired as prior. No further seizure events.  Continues on 71m daily decadron as well.  Cody [name] presented to medical attention in August 2020 with several weeks history of new onset headaches.  Progressive nature of symptoms led to an ED admission, where CNS imaging demonstrated enhancing mass in the left occipital lobe.  He underwent craniotomy and resection with Dr. [name] 88/25/00without complication.  He was discharged to home with small visual field impairment and otherwise functionally intact.  He denies headaches or any seizures since surgery, having completed decadron taper.    Medications: Current Outpatient Medications on File Prior to Visit  Medication Sig Dispense Refill   dexamethasone (DECADRON) 2 MG tablet Take 2 mg by mouth daily.     finasteride (PROSCAR) 5 MG tablet Take 5 mg by mouth daily.     GLEOSTINE 10 MG capsule Take 10 mg by mouth once. CHEMO every 42 days     GLEOSTINE 100 MG capsule Take 100  mg by mouth once. CHEMO, every 42 days     GLEOSTINE 40 MG capsule Take 2 capsules by mouth See admin instructions. CHEMO, every 42 days     levETIRAcetam (KEPPRA) 500 MG tablet TAKE 2 TABLETS(1000 MG) BY MOUTH TWICE DAILY 360 tablet 1   tamsulosin (FLOMAX) 0.4 MG CAPS capsule Take 0.4 mg by mouth daily.     traMADol (ULTRAM) 50 MG tablet TAKE 1 TABLET(50 MG) BY MOUTH EVERY 6 HOURS AS NEEDED FOR SEVERE PAIN ([name]  not taking: Reported on 06/12/2019) 30 tablet 1   No current facility-administered medications on file prior to visit.    Allergies: No Known Allergies Past Medical History:  Past Medical History:  Diagnosis Date   Enlarged prostate    Migraines    Past Surgical History:  Past Surgical History:  Procedure Laterality Date   APPLICATION OF CRANIAL NAVIGATION Left 09/29/2018   Procedure: APPLICATION OF CRANIAL NAVIGATION;  Surgeon: [name], MD;  Location: [facility];  Service: Neurosurgery;  Laterality: Left;   CRANIOTOMY Left 09/29/2018   Procedure: Sterotactic Left craniotomy for resection of tumor;  Surgeon: [name], MD;  Location: [facility];  Service: Neurosurgery;  Laterality: Left;  Sterotactic Left craniotomy for resection of tumor   Social History:  Social History   Socioeconomic History   Marital status: Married    Spouse name: Not on file   Number of children: Not on file   Years of education: Not on file   Highest education level: Not on file  Occupational History   Not on file  Tobacco Use   Smoking status: Never Smoker   Smokeless tobacco: Never Used  Vaping Use   Vaping Use: Never used  Substance and Sexual Activity   Alcohol use: Never   Drug use: Never   Sexual activity: Not on file  Other Topics Concern   Not on file  Social History Narrative   Not on file   Social Determinants of Health   Financial Resource Strain:    Difficulty of Paying Living Expenses:   Food Insecurity:    Worried About [place] in the Last Year:    Arboriculturist in the Last Year:   Transportation Needs: Unmet Transportation Needs   Lack of Transportation (Medical): Yes   Lack of Transportation (Non-Medical): No  Physical Activity:    Days of Exercise per Week:    Minutes of Exercise per Session:   Stress:    Feeling of Stress :   Social Connections:    Frequency of Communication with Friends and Family:    Frequency of  Social Gatherings with Friends and Family:    Attends Religious Services:    Active Member of Clubs or Organizations:    Attends Music therapist:    Marital Status:   Intimate Partner Violence: Not At Risk   Fear of Current or Ex-Partner: No   Emotionally Abused: No   Physically Abused: No   Sexually Abused: No   Family History: No family history on file.  Review of Systems: Constitutional: Denies fevers, chills or abnormal weight loss Eyes: Denies blurriness of vision Ears, nose, mouth, throat, and face: Denies mucositis or sore throat Respiratory: Denies cough, dyspnea or wheezes Cardiovascular: Denies palpitation, chest discomfort or lower extremity swelling Gastrointestinal:  Denies nausea, constipation, diarrhea GU: Denies dysuria or incontinence Skin: Denies abnormal skin rashes Neurological: Per HPI Musculoskeletal: Denies joint pain, back or neck discomfort. No decrease in ROM Behavioral/Psych: Denies  anxiety, disturbance in thought content, and mood instability  Physical Exam: Vitals:   08/08/19 1038  BP: (!) 152/93  Pulse: (!) 58  Resp: 18  Temp: 98.7 F (37.1 C)  SpO2: 100%   KPS: 80. General: Alert, cooperative, pleasant, in no acute distress Head: Craniotomy scar noted, dry and intact. EENT: No conjunctival injection or scleral icterus. Oral mucosa moist Lungs: Resp effort normal Cardiac: Regular rate and rhythm Abdomen: Soft, non-distended abdomen Skin: No rashes cyanosis or petechiae. Extremities: No clubbing or edema  Neurologic Exam: Mental Status: Awake, alert, attentive to examiner. Oriented to self and environment. Moderate mixed dysphasia motor>sensory. Cranial Nerves: Visual acuity is grossly normal. Right homonymous hemianopia. Extra-ocular movements intact. No ptosis. Face is symmetric, tongue midline. Motor: Tone and bulk are normal. Power is full in both arms and legs. Reflexes are symmetric, no pathologic reflexes  present. Intact finger to nose bilaterally Sensory: Intact to light touch and temperature Gait: Wide based, dystaxic  Labs: I have reviewed the data as listed    Component Value Date/Time   NA 141 07/24/2019 1113   K 3.7 07/24/2019 1113   CL 107 07/24/2019 1113   CO2 24 07/24/2019 1113   GLUCOSE 128 (H) 07/24/2019 1113   BUN 11 07/24/2019 1113   CREATININE 1.34 (H) 07/24/2019 1113   CALCIUM 8.9 07/24/2019 1113   PROT 6.6 07/24/2019 1113   ALBUMIN 3.6 07/24/2019 1113   AST 13 (L) 07/24/2019 1113   ALT 15 07/24/2019 1113   ALKPHOS 81 07/24/2019 1113   BILITOT 0.6 07/24/2019 1113   GFRNONAA 59 (L) 07/24/2019 1113   GFRAA >60 07/24/2019 1113   Lab Results  Component Value Date   WBC 12.1 (H) 08/08/2019   NEUTROABS 9.7 (H) 08/08/2019   HGB 15.1 08/08/2019   HCT 42.6 08/08/2019   MCV 84.2 08/08/2019   PLT 138 (L) 08/08/2019    Assessment/Plan Glioblastoma ([facility])  Focal seizures ([facility])  Cody [name] is clinically stable today.  We again discussed goals of care and treatment recommendations regarding his progressive glioblastoma, now having failed temozolomide, CCNU and avastin.    Labs are within normal limits; he will proceed with todays avastin infusion 52m/kg.  Avastin continues to control enhancing burden of disease and can be continued in the interim as a quality of life measure.  Avastin should be held for the following:  ANC less than 500  Platelets less than 50,000  LFT or creatinine greater than 2x ULN  If clinical concerns/contraindications develop  Recommended continuing decadron 276mdaily.  We appreciate the opportunity to participate in the care of Cody [name]   We recommend he return to clinic in 2 weeks for next planned avastin.  All questions were answered. [name] knows to call the clinic with any problems, questions or concerns. No barriers to learning were detected.  The total time spent in the encounter was 30 minutes and more  than 50% was on counseling and review of test results   ZaVentura SellersMD Medical Director of Neuro-Oncology [facility]7/06/21 10:43 AM

## 2019-08-09 ENCOUNTER — Telehealth: Payer: Self-pay | Admitting: Internal Medicine

## 2019-08-09 NOTE — Telephone Encounter (Signed)
Scheduled per 07/06 los, patient has been called and voicemail was left. 

## 2019-08-10 ENCOUNTER — Other Ambulatory Visit: Payer: Self-pay | Admitting: Internal Medicine

## 2019-08-14 NOTE — Telephone Encounter (Signed)
Rx refill

## 2019-08-15 ENCOUNTER — Inpatient Hospital Stay: Payer: BC Managed Care – PPO

## 2019-08-18 ENCOUNTER — Other Ambulatory Visit: Payer: Self-pay | Admitting: *Deleted

## 2019-08-18 ENCOUNTER — Telehealth: Payer: Self-pay | Admitting: *Deleted

## 2019-08-18 MED ORDER — DEXAMETHASONE 2 MG PO TABS
2.0000 mg | ORAL_TABLET | Freq: Every day | ORAL | 2 refills | Status: DC
Start: 2019-08-18 — End: 2019-09-07

## 2019-08-18 NOTE — Telephone Encounter (Signed)
Received call from pt's wife, Sol[facility] Ice. She is requesting refill prescription for Dexamthasone 2mg  tabs.  Currently he has 4 mg tabs and she states that when she tries to break them in half they get crushed. Confirmed 2mg  dose per Dr. Mickeal Skinner notes from last week.   Refill of 2 mg tabs sent to their pharmacy. Felicia appreciative of this.

## 2019-08-24 ENCOUNTER — Inpatient Hospital Stay: Payer: BC Managed Care – PPO

## 2019-08-24 ENCOUNTER — Other Ambulatory Visit: Payer: Self-pay

## 2019-08-24 ENCOUNTER — Inpatient Hospital Stay (HOSPITAL_BASED_OUTPATIENT_CLINIC_OR_DEPARTMENT_OTHER): Payer: BC Managed Care – PPO | Admitting: Internal Medicine

## 2019-08-24 VITALS — BP 153/101 | HR 86 | Temp 97.2°F | Resp 18 | Ht 72.0 in | Wt 186.4 lb

## 2019-08-24 VITALS — BP 150/81 | HR 58

## 2019-08-24 DIAGNOSIS — C719 Malignant neoplasm of brain, unspecified: Secondary | ICD-10-CM | POA: Diagnosis not present

## 2019-08-24 DIAGNOSIS — C714 Malignant neoplasm of occipital lobe: Secondary | ICD-10-CM | POA: Diagnosis not present

## 2019-08-24 DIAGNOSIS — R569 Unspecified convulsions: Secondary | ICD-10-CM | POA: Diagnosis not present

## 2019-08-24 LAB — CMP (CANCER CENTER ONLY)
ALT: 20 U/L (ref 0–44)
AST: 14 U/L — ABNORMAL LOW (ref 15–41)
Albumin: 3.6 g/dL (ref 3.5–5.0)
Alkaline Phosphatase: 89 U/L (ref 38–126)
Anion gap: 11 (ref 5–15)
BUN: 16 mg/dL (ref 6–20)
CO2: 24 mmol/L (ref 22–32)
Calcium: 9.2 mg/dL (ref 8.9–10.3)
Chloride: 108 mmol/L (ref 98–111)
Creatinine: 1.25 mg/dL — ABNORMAL HIGH (ref 0.61–1.24)
GFR, Est AFR Am: >60 mL/min (ref >60)
GFR, Estimated: >60 mL/min (ref >60)
Glucose, Bld: 125 mg/dL — ABNORMAL HIGH (ref 70–99)
Potassium: 3.9 mmol/L (ref 3.5–5.1)
Sodium: 143 mmol/L (ref 135–145)
Total Bilirubin: 0.7 mg/dL (ref 0.3–1.2)
Total Protein: 6.7 g/dL (ref 6.5–8.1)

## 2019-08-24 LAB — CBC WITH DIFFERENTIAL (CANCER CENTER ONLY)
Abs Immature Granulocytes: 0.08 10*3/uL — ABNORMAL HIGH (ref 0.00–0.07)
Basophils Absolute: 0 10*3/uL (ref 0.0–0.1)
Basophils Relative: 0 %
Eosinophils Absolute: 0 10*3/uL (ref 0.0–0.5)
Eosinophils Relative: 0 %
HCT: 43.6 % (ref 39.0–52.0)
Hemoglobin: 15.3 g/dL (ref 13.0–17.0)
Immature Granulocytes: 1 %
Lymphocytes Relative: 10 %
Lymphs Abs: 1.1 10*3/uL (ref 0.7–4.0)
MCH: 30.2 pg (ref 26.0–34.0)
MCHC: 35.1 g/dL (ref 30.0–36.0)
MCV: 86 fL (ref 80.0–100.0)
Monocytes Absolute: 0.6 10*3/uL (ref 0.1–1.0)
Monocytes Relative: 5 %
Neutro Abs: 9.1 10*3/uL — ABNORMAL HIGH (ref 1.7–7.7)
Neutrophils Relative %: 84 %
Platelet Count: 114 10*3/uL — ABNORMAL LOW (ref 150–400)
RBC: 5.07 MIL/uL (ref 4.22–5.81)
RDW: 16.2 % — ABNORMAL HIGH (ref 11.5–15.5)
WBC Count: 10.9 10*3/uL — ABNORMAL HIGH (ref 4.0–10.5)
nRBC: 0 % (ref 0.0–0.2)

## 2019-08-24 LAB — TOTAL PROTEIN, URINE DIPSTICK: Protein, ur: NEGATIVE mg/dL

## 2019-08-24 MED ORDER — SODIUM CHLORIDE 0.9 % IV SOLN
Freq: Once | INTRAVENOUS | Status: AC
  Filled 2019-08-24: qty 250

## 2019-08-24 MED ORDER — SODIUM CHLORIDE 0.9 % IV SOLN
10.0000 mg/kg | Freq: Once | INTRAVENOUS | Status: AC
  Administered 2019-08-24: 900 mg via INTRAVENOUS
  Filled 2019-08-24: qty 32

## 2019-08-24 NOTE — Progress Notes (Signed)
[facility] [address] [place], [place] [address] [phone]   Interval Evaluation  Date of Service: 08/24/19 Patient Name: Cody [name] Patient MRN: [id number] Patient DOB: September 18, 1960 Provider: Ventura Sellers, MD  Identifying Statement:  NAJIR [name] is a 59 y.o. male with left occipital glioblastoma   Oncologic History: Oncology History  Glioblastoma ([place])  09/29/2018 Surgery   Craniotomy, resection with Dr. Kathyrn Sheriff   10/21/2018 - 10/31/2018 Chemotherapy   The patient had [No matching medication found in this treatment plan]  for chemotherapy treatment.    01/09/2019 - 05/18/2019 Chemotherapy   The patient had [No matching medication found in this treatment plan]  for chemotherapy treatment.    01/23/2019 -  Chemotherapy   The patient had bevacizumab-bvzr (ZIRABEV) 800 mg in sodium chloride 0.9 % 100 mL chemo infusion, 10 mg/kg = 800 mg, Intravenous,  Once, 14 of 14 cycles Administration: 800 mg (01/23/2019), 800 mg (02/09/2019), 900 mg (02/24/2019), 900 mg (03/10/2019), 900 mg (03/24/2019), 900 mg (04/07/2019), 900 mg (04/21/2019), 900 mg (05/05/2019), 900 mg (05/30/2019), 900 mg (06/26/2019), 900 mg (06/12/2019), 900 mg (07/24/2019), 900 mg (08/08/2019), 900 mg (08/24/2019)  for chemotherapy treatment.    05/30/2019 - 05/30/2019 Chemotherapy   The patient had dexamethasone (DECADRON) 4 MG tablet, 1 of 1 cycle, Start date: --, End date: -- lomustine (CEENU) 10 MG capsule, 10 mg (100 % of original dose 10 mg), Oral,  Once, 1 of 1 cycle, Start date: 05/30/2019, End date: 05/30/2019 Dose modification: 10 mg (original dose 10 mg, Cycle 1) lomustine (CEENU) 100 MG capsule, 100 mg (100 % of original dose 100 mg), Oral,  Once, 1 of 1 cycle, Start date: 05/30/2019, End date: 05/30/2019 Dose modification: 100 mg (original dose 100 mg, Cycle 1) lomustine (CEENU) 40 MG capsule, 80 mg (100 % of original dose 80 mg), Oral,  Once, 1 of 1 cycle, Start  date: 05/30/2019, End date: 05/30/2019 Dose modification: 80 mg (original dose 80 mg, Cycle 1)  for chemotherapy treatment.      Biomarkers:  MGMT Unknown.  IDH 1/2 Wild type.  EGFR Unknown  TERT Unknown   Interval History:  Cody [name] presents today for clinical follow up and avastin infusion.  Daughter is here with him today. Still having trouble with communication, unchanged from prior.  No new symptoms. Continues on 76m daily decadron daily.  H+P (10/18/18) Patient presented to medical attention in August 2020 with several weeks history of new onset headaches.  Progressive nature of symptoms led to an ED admission, where CNS imaging demonstrated enhancing mass in the left occipital lobe.  He underwent craniotomy and resection with Dr. [name] 87/62/26without complication.  He was discharged to home with small visual field impairment and otherwise functionally intact.  He denies headaches or any seizures since surgery, having completed decadron taper.    Medications: Current Outpatient Medications on File Prior to Visit  Medication Sig Dispense Refill  . dexamethasone (DECADRON) 2 MG tablet Take 1 tablet (2 mg total) by mouth daily. 30 tablet 2  . finasteride (PROSCAR) 5 MG tablet Take 5 mg by mouth daily.    .Marland KitchenlevETIRAcetam (KEPPRA) 500 MG tablet TAKE 2 TABLETS(1000 MG) BY MOUTH TWICE DAILY 360 tablet 1  . tamsulosin (FLOMAX) 0.4 MG CAPS capsule Take 0.4 mg by mouth daily.    . traMADol (ULTRAM) 50 MG tablet TAKE 1 TABLET(50 MG) BY MOUTH EVERY 6 HOURS AS NEEDED FOR SEVERE  PAIN 30 tablet 1  . GLEOSTINE 10 MG capsule Take 10 mg by mouth once. CHEMO every 42 days (Patient not taking: Reported on 08/24/2019)    . GLEOSTINE 100 MG capsule Take 100 mg by mouth once. CHEMO, every 42 days (Patient not taking: Reported on 08/24/2019)    . GLEOSTINE 40 MG capsule Take 2 capsules by mouth See admin instructions. CHEMO, every 42 days (Patient not taking: Reported on 08/24/2019)     No  current facility-administered medications on file prior to visit.    Allergies: No Known Allergies Past Medical History:  Past Medical History:  Diagnosis Date  . Enlarged prostate   . Migraines    Past Surgical History:  Past Surgical History:  Procedure Laterality Date  . APPLICATION OF CRANIAL NAVIGATION Left 09/29/2018   Procedure: APPLICATION OF CRANIAL NAVIGATION;  Surgeon: [name], MD;  Location: [facility];  Service: Neurosurgery;  Laterality: Left;  . CRANIOTOMY Left 09/29/2018   Procedure: Sterotactic Left craniotomy for resection of tumor;  Surgeon: [name], MD;  Location: [facility];  Service: Neurosurgery;  Laterality: Left;  Sterotactic Left craniotomy for resection of tumor   Social History:  Social History   Socioeconomic History  . Marital status: Married    Spouse name: Not on file  . Number of children: Not on file  . Years of education: Not on file  . Highest education level: Not on file  Occupational History  . Not on file  Tobacco Use  . Smoking status: Never Smoker  . Smokeless tobacco: Never Used  Vaping Use  . Vaping Use: Never used  Substance and Sexual Activity  . Alcohol use: Never  . Drug use: Never  . Sexual activity: Not on file  Other Topics Concern  . Not on file  Social History Narrative  . Not on file   Social Determinants of Health   Financial Resource Strain:   . Difficulty of Paying Living Expenses:   Food Insecurity:   . Worried About Charity fundraiser in the Last Year:   . Arboriculturist in the Last Year:   Transportation Needs: Unmet Transportation Needs  . Lack of Transportation (Medical): Yes  . Lack of Transportation (Non-Medical): No  Physical Activity:   . Days of Exercise per Week:   . Minutes of Exercise per Session:   Stress:   . Feeling of Stress :   Social Connections:   . Frequency of Communication with Friends and Family:   . Frequency of Social Gatherings with Friends and Family:   .  Attends Religious Services:   . Active Member of Clubs or Organizations:   . Attends Archivist Meetings:   [name] Marital Status:   Intimate Partner Violence: Not At Risk  . Fear of Current or Ex-Partner: No  . Emotionally Abused: No  . Physically Abused: No  . Sexually Abused: No   Family History: No family history on file.  Review of Systems: Constitutional: Denies fevers, chills or abnormal weight loss Eyes: Denies blurriness of vision Ears, nose, mouth, throat, and face: Denies mucositis or sore throat Respiratory: Denies cough, dyspnea or wheezes Cardiovascular: Denies palpitation, chest discomfort or lower extremity swelling Gastrointestinal:  Denies nausea, constipation, diarrhea GU: Denies dysuria or incontinence Skin: Denies abnormal skin rashes Neurological: Per HPI Musculoskeletal: Denies joint pain, back or neck discomfort. No decrease in ROM Behavioral/Psych: Denies anxiety, disturbance in thought content, and mood instability  Physical Exam: Vitals:   08/24/19 1140  08/24/19 1141  BP: (!) 162/101 (!) 153/101  Pulse: 86   Resp: 18   Temp: (!) 97.2 F (36.2 C)   SpO2: 100%    KPS: 80. General: Alert, cooperative, pleasant, in no acute distress Head: Craniotomy scar noted, dry and intact. EENT: No conjunctival injection or scleral icterus. Oral mucosa moist Lungs: Resp effort normal Cardiac: Regular rate and rhythm Abdomen: Soft, non-distended abdomen Skin: No rashes cyanosis or petechiae. Extremities: No clubbing or edema  Neurologic Exam: Mental Status: Awake, alert, attentive to examiner. Oriented to self and environment. Moderate mixed dysphasia motor>sensory. Cranial Nerves: Visual acuity is grossly normal. Right homonymous hemianopia. Extra-ocular movements intact. No ptosis. Face is symmetric, tongue midline. Motor: Tone and bulk are normal. Power is full in both arms and legs. Reflexes are symmetric, no pathologic reflexes present. Intact finger  to nose bilaterally Sensory: Intact to light touch and temperature Gait: Wide based, dystaxic  Labs: I have reviewed the data as listed    Component Value Date/Time   NA 143 08/24/2019 1125   K 3.9 08/24/2019 1125   CL 108 08/24/2019 1125   CO2 24 08/24/2019 1125   GLUCOSE 125 (H) 08/24/2019 1125   BUN 16 08/24/2019 1125   CREATININE 1.25 (H) 08/24/2019 1125   CALCIUM 9.2 08/24/2019 1125   PROT 6.7 08/24/2019 1125   ALBUMIN 3.6 08/24/2019 1125   AST 14 (L) 08/24/2019 1125   ALT 20 08/24/2019 1125   ALKPHOS 89 08/24/2019 1125   BILITOT 0.7 08/24/2019 1125   GFRNONAA >60 08/24/2019 1125   GFRAA >60 08/24/2019 1125   Lab Results  Component Value Date   WBC 10.9 (H) 08/24/2019   NEUTROABS 9.1 (H) 08/24/2019   HGB 15.3 08/24/2019   HCT 43.6 08/24/2019   MCV 86.0 08/24/2019   PLT 114 (L) 08/24/2019    Assessment/Plan Glioblastoma (HCC) - Plan: DISCONTINUED: 0.9 %  sodium chloride infusion, DISCONTINUED: bevacizumab-bvzr (ZIRABEV) 900 mg in sodium chloride 0.9 % 100 mL chemo infusion  Focal seizures ([facility])  Mr. Tallon is clinically stable today, no new or progressive deficits.  Labs are within normal limits.  He will proceed with todays avastin infusion 46m/kg.  Avastin continues to control enhancing burden of disease and can be continued in the interim as a quality of life measure.  Avastin should be held for the following:  ANC less than 500  Platelets less than 50,000  LFT or creatinine greater than 2x ULN  If clinical concerns/contraindications develop  Recommended continuing decadron 268mdaily.  We appreciate the opportunity to participate in the care of MiPACE LAMADRID   We recommend he return to clinic in 2 weeks for next planned avastin.  Next MRI brain should be performed in 4 weeks.   All questions were answered. The patient knows to call the clinic with any problems, questions or concerns. No barriers to learning were detected.  The total time  spent in the encounter was 30 minutes and more than 50% was on counseling and review of test results   ZaVentura SellersMD Medical Director of Neuro-Oncology [facility] We[place]7/22/21 3:14 PM

## 2019-08-24 NOTE — Patient Instructions (Signed)
Bevacizumab injection What is this medicine? BEVACIZUMAB (be va SIZ yoo mab) is a monoclonal antibody. It is used to treat many types of cancer. This medicine may be used for other purposes; ask your health care provider or pharmacist if you have questions. COMMON BRAND NAME(S): Avastin, MVASI, Zirabev What should I tell my health care provider before I take this medicine? They need to know if you have any of these conditions:  diabetes  heart disease  high blood pressure  history of coughing up blood  prior anthracycline chemotherapy (e.g., doxorubicin, daunorubicin, epirubicin)  recent or ongoing radiation therapy  recent or planning to have surgery  stroke  an unusual or allergic reaction to bevacizumab, hamster proteins, mouse proteins, other medicines, foods, dyes, or preservatives  pregnant or trying to get pregnant  breast-feeding How should I use this medicine? This medicine is for infusion into a vein. It is given by a health care professional in a hospital or clinic setting. Talk to your pediatrician regarding the use of this medicine in children. Special care may be needed. Overdosage: If you think you have taken too much of this medicine contact a poison control center or emergency room at once. NOTE: This medicine is only for you. Do not share this medicine with others. What if I miss a dose? It is important not to miss your dose. Call your doctor or health care professional if you are unable to keep an appointment. What may interact with this medicine? Interactions are not expected. This list may not describe all possible interactions. Give your health care provider a list of all the medicines, herbs, non-prescription drugs, or dietary supplements you use. Also tell them if you smoke, drink alcohol, or use illegal drugs. Some items may interact with your medicine. What should I watch for while using this medicine? Your condition will be monitored carefully while  you are receiving this medicine. You will need important blood work and urine testing done while you are taking this medicine. This medicine may increase your risk to bruise or bleed. Call your doctor or health care professional if you notice any unusual bleeding. Before having surgery, talk to your health care provider to make sure it is ok. This drug can increase the risk of poor healing of your surgical site or wound. You will need to stop this drug for 28 days before surgery. After surgery, wait at least 28 days before restarting this drug. Make sure the surgical site or wound is healed enough before restarting this drug. Talk to your health care provider if questions. Do not become pregnant while taking this medicine or for 6 months after stopping it. Women should inform their doctor if they wish to become pregnant or think they might be pregnant. There is a potential for serious side effects to an unborn child. Talk to your health care professional or pharmacist for more information. Do not breast-feed an infant while taking this medicine and for 6 months after the last dose. This medicine has caused ovarian failure in some women. This medicine may interfere with the ability to have a child. You should talk to your doctor or health care professional if you are concerned about your fertility. What side effects may I notice from receiving this medicine? Side effects that you should report to your doctor or health care professional as soon as possible:  allergic reactions like skin rash, itching or hives, swelling of the face, lips, or tongue  chest pain or chest tightness    chills  coughing up blood  high fever  seizures  severe constipation  signs and symptoms of bleeding such as bloody or black, tarry stools; red or dark-brown urine; spitting up blood or brown material that looks like coffee grounds; red spots on the skin; unusual bruising or bleeding from the eye, gums, or nose  signs  and symptoms of a blood clot such as breathing problems; chest pain; severe, sudden headache; pain, swelling, warmth in the leg  signs and symptoms of a stroke like changes in vision; confusion; trouble speaking or understanding; severe headaches; sudden numbness or weakness of the face, arm or leg; trouble walking; dizziness; loss of balance or coordination  stomach pain  sweating  swelling of legs or ankles  vomiting  weight gain Side effects that usually do not require medical attention (report to your doctor or health care professional if they continue or are bothersome):  back pain  changes in taste  decreased appetite  dry skin  nausea  tiredness This list may not describe all possible side effects. Call your doctor for medical advice about side effects. You may report side effects to FDA at 1-800-FDA-1088. Where should I keep my medicine? This drug is given in a hospital or clinic and will not be stored at home. NOTE: This sheet is a summary. It may not cover all possible information. If you have questions about this medicine, talk to your doctor, pharmacist, or health care provider.  2020 Elsevier/Gold Standard (2018-11-16 10:50:46)  

## 2019-08-25 ENCOUNTER — Telehealth: Payer: Self-pay | Admitting: Internal Medicine

## 2019-08-25 NOTE — Telephone Encounter (Signed)
Scheduled per 7/22 los. Unable to reach pt. Left voicemail with appt times and date.

## 2019-09-04 ENCOUNTER — Other Ambulatory Visit: Payer: Self-pay | Admitting: *Deleted

## 2019-09-04 DIAGNOSIS — C719 Malignant neoplasm of brain, unspecified: Secondary | ICD-10-CM

## 2019-09-04 NOTE — Progress Notes (Signed)
Changed location for MRI

## 2019-09-07 ENCOUNTER — Inpatient Hospital Stay: Payer: BC Managed Care – PPO | Attending: Internal Medicine | Admitting: Internal Medicine

## 2019-09-07 ENCOUNTER — Inpatient Hospital Stay: Payer: BC Managed Care – PPO

## 2019-09-07 ENCOUNTER — Other Ambulatory Visit: Payer: Self-pay

## 2019-09-07 VITALS — BP 156/90 | HR 55 | Temp 97.9°F | Resp 20 | Ht 72.0 in | Wt 190.8 lb

## 2019-09-07 DIAGNOSIS — N400 Enlarged prostate without lower urinary tract symptoms: Secondary | ICD-10-CM | POA: Insufficient documentation

## 2019-09-07 DIAGNOSIS — C719 Malignant neoplasm of brain, unspecified: Secondary | ICD-10-CM | POA: Diagnosis not present

## 2019-09-07 DIAGNOSIS — Z79899 Other long term (current) drug therapy: Secondary | ICD-10-CM | POA: Insufficient documentation

## 2019-09-07 DIAGNOSIS — C714 Malignant neoplasm of occipital lobe: Secondary | ICD-10-CM | POA: Insufficient documentation

## 2019-09-07 DIAGNOSIS — G9389 Other specified disorders of brain: Secondary | ICD-10-CM | POA: Insufficient documentation

## 2019-09-07 DIAGNOSIS — Z5112 Encounter for antineoplastic immunotherapy: Secondary | ICD-10-CM | POA: Diagnosis not present

## 2019-09-07 DIAGNOSIS — Z9221 Personal history of antineoplastic chemotherapy: Secondary | ICD-10-CM | POA: Insufficient documentation

## 2019-09-07 DIAGNOSIS — R569 Unspecified convulsions: Secondary | ICD-10-CM | POA: Insufficient documentation

## 2019-09-07 DIAGNOSIS — R5383 Other fatigue: Secondary | ICD-10-CM | POA: Insufficient documentation

## 2019-09-07 LAB — CMP (CANCER CENTER ONLY)
ALT: 21 U/L (ref 0–44)
AST: 12 U/L — ABNORMAL LOW (ref 15–41)
Albumin: 3.7 g/dL (ref 3.5–5.0)
Alkaline Phosphatase: 92 U/L (ref 38–126)
Anion gap: 11 (ref 5–15)
BUN: 16 mg/dL (ref 6–20)
CO2: 22 mmol/L (ref 22–32)
Calcium: 9.2 mg/dL (ref 8.9–10.3)
Chloride: 108 mmol/L (ref 98–111)
Creatinine: 1.23 mg/dL (ref 0.61–1.24)
GFR, Est AFR Am: >60 mL/min (ref >60)
GFR, Estimated: >60 mL/min (ref >60)
Glucose, Bld: 170 mg/dL — ABNORMAL HIGH (ref 70–99)
Potassium: 4.2 mmol/L (ref 3.5–5.1)
Sodium: 141 mmol/L (ref 135–145)
Total Bilirubin: 0.6 mg/dL (ref 0.3–1.2)
Total Protein: 6.7 g/dL (ref 6.5–8.1)

## 2019-09-07 LAB — CBC WITH DIFFERENTIAL (CANCER CENTER ONLY)
Abs Immature Granulocytes: 0.1 10*3/uL — ABNORMAL HIGH (ref 0.00–0.07)
Basophils Absolute: 0 10*3/uL (ref 0.0–0.1)
Basophils Relative: 0 %
Eosinophils Absolute: 0 10*3/uL (ref 0.0–0.5)
Eosinophils Relative: 0 %
HCT: 42.4 % (ref 39.0–52.0)
Hemoglobin: 15.1 g/dL (ref 13.0–17.0)
Immature Granulocytes: 1 %
Lymphocytes Relative: 3 %
Lymphs Abs: 0.5 10*3/uL — ABNORMAL LOW (ref 0.7–4.0)
MCH: 31.1 pg (ref 26.0–34.0)
MCHC: 35.6 g/dL (ref 30.0–36.0)
MCV: 87.2 fL (ref 80.0–100.0)
Monocytes Absolute: 0.3 10*3/uL (ref 0.1–1.0)
Monocytes Relative: 2 %
Neutro Abs: 12.3 10*3/uL — ABNORMAL HIGH (ref 1.7–7.7)
Neutrophils Relative %: 94 %
Platelet Count: 117 10*3/uL — ABNORMAL LOW (ref 150–400)
RBC: 4.86 MIL/uL (ref 4.22–5.81)
RDW: 14.7 % (ref 11.5–15.5)
WBC Count: 13.2 10*3/uL — ABNORMAL HIGH (ref 4.0–10.5)
nRBC: 0 % (ref 0.0–0.2)

## 2019-09-07 LAB — TOTAL PROTEIN, URINE DIPSTICK: Protein, ur: NEGATIVE mg/dL

## 2019-09-07 MED ORDER — SODIUM CHLORIDE 0.9 % IV SOLN
Freq: Once | INTRAVENOUS | Status: AC
  Filled 2019-09-07: qty 250

## 2019-09-07 MED ORDER — SODIUM CHLORIDE 0.9 % IV SOLN
10.0000 mg/kg | Freq: Once | INTRAVENOUS | Status: AC
  Administered 2019-09-07: 900 mg via INTRAVENOUS
  Filled 2019-09-07: qty 32

## 2019-09-07 MED ORDER — DEXAMETHASONE 4 MG PO TABS: 4.0000 mg | ORAL_TABLET | Freq: Every day | ORAL | 3 refills | Status: DC

## 2019-09-07 NOTE — Progress Notes (Signed)
[facility] [place], [place] [address] [phone]   Interval Evaluation  Date of Service: 09/07/19 Patient Name: Cody [name] Patient MRN: [id number] Patient DOB: 23-Mar-1960 Provider: Ventura Sellers, MD  Identifying Statement:  Cody [name] is a 59 y.o. male with left occipital glioblastoma   Oncologic History: Oncology History  Glioblastoma ([facility])  09/29/2018 Surgery   Craniotomy, resection with Dr. Kathyrn Sheriff   10/21/2018 - 10/31/2018 Chemotherapy   The patient had [No matching medication found in this treatment plan]  for chemotherapy treatment.    01/09/2019 - 05/18/2019 Chemotherapy   The patient had [No matching medication found in this treatment plan]  for chemotherapy treatment.    01/23/2019 -  Chemotherapy   The patient had bevacizumab-bvzr (ZIRABEV) 800 mg in sodium chloride 0.9 % 100 mL chemo infusion, 10 mg/kg = 800 mg, Intravenous,  Once, 15 of 15 cycles Administration: 800 mg (01/23/2019), 800 mg (02/09/2019), 900 mg (02/24/2019), 900 mg (03/10/2019), 900 mg (03/24/2019), 900 mg (04/07/2019), 900 mg (04/21/2019), 900 mg (05/05/2019), 900 mg (05/30/2019), 900 mg (06/26/2019), 900 mg (06/12/2019), 900 mg (07/24/2019), 900 mg (08/08/2019), 900 mg (08/24/2019), 900 mg (09/07/2019)  for chemotherapy treatment.    05/30/2019 - 05/30/2019 Chemotherapy   The patient had dexamethasone (DECADRON) 4 MG tablet, 1 of 1 cycle, Start date: --, End date: -- lomustine (CEENU) 10 MG capsule, 10 mg (100 % of original dose 10 mg), Oral,  Once, 1 of 1 cycle, Start date: 05/30/2019, End date: 05/30/2019 Dose modification: 10 mg (original dose 10 mg, Cycle 1) lomustine (CEENU) 100 MG capsule, 100 mg (100 % of original dose 100 mg), Oral,  Once, 1 of 1 cycle, Start date: 05/30/2019, End date: 05/30/2019 Dose modification: 100 mg (original dose 100 mg, Cycle 1) lomustine (CEENU) 40 MG capsule, 80 mg (100 % of original dose 80 mg), Oral,  Once, 1 of  1 cycle, Start date: 05/30/2019, End date: 05/30/2019 Dose modification: 80 mg (original dose 80 mg, Cycle 1)  for chemotherapy treatment.      Biomarkers:  MGMT Unknown.  IDH 1/2 Wild type.  EGFR Unknown  TERT Unknown   Interval History:  Cody [name] presents today for clinical follow up and avastin infusion.  Wife is at bedside today.  No further decline in language or right sided weakness.  Does describe increase in headache frequency which improved when increased decadron to 63m daily from 282m  No change in activity level.  H+P (10/18/18) Patient presented to medical attention in August 2020 with several weeks history of new onset headaches.  Progressive nature of symptoms led to an ED admission, where CNS imaging demonstrated enhancing mass in the left occipital lobe.  He underwent craniotomy and resection with Dr. NuKathyrn Sheriffn 09/10/96/33ithout complication.  He was discharged to home with small visual field impairment and otherwise functionally intact.  He denies headaches or any seizures since surgery, having completed decadron taper.    Medications: Current Outpatient Medications on File Prior to Visit  Medication Sig Dispense Refill  . acetaminophen (TYLENOL) 500 MG tablet Take 500 mg by mouth every 6 (six) hours as needed.    . finasteride (PROSCAR) 5 MG tablet Take 5 mg by mouth daily.    . Marland KitchenevETIRAcetam (KEPPRA) 500 MG tablet TAKE 2 TABLETS(1000 MG) BY MOUTH TWICE DAILY 360 tablet 1  . tamsulosin (FLOMAX) 0.4 MG CAPS capsule Take 0.4 mg by mouth daily.    .[name]  GLEOSTINE 10 MG capsule Take 10 mg by mouth once. CHEMO every 42 days (Patient not taking: Reported on 08/24/2019)    . GLEOSTINE 100 MG capsule Take 100 mg by mouth once. CHEMO, every 42 days (Patient not taking: Reported on 08/24/2019)    . GLEOSTINE 40 MG capsule Take 2 capsules by mouth See admin instructions. CHEMO, every 42 days (Patient not taking: Reported on 08/24/2019)    . HYDROcodone-acetaminophen  (NORCO/VICODIN) 5-325 MG tablet Take 1 tablet by mouth every 6 (six) hours as needed for moderate pain. (Patient not taking: Reported on 09/07/2019)    . oxyCODONE (OXY IR/ROXICODONE) 5 MG immediate release tablet Take 5 mg by mouth every 4 (four) hours as needed for severe pain. (Patient not taking: Reported on 09/07/2019)     No current facility-administered medications on file prior to visit.    Allergies: No Known Allergies Past Medical History:  Past Medical History:  Diagnosis Date  . Enlarged prostate   . Migraines    Past Surgical History:  Past Surgical History:  Procedure Laterality Date  . APPLICATION OF CRANIAL NAVIGATION Left 09/29/2018   Procedure: APPLICATION OF CRANIAL NAVIGATION;  Surgeon: Cody [name], Neelesh, MD;  Location: [facility] OR;  Service: Neurosurgery;  Laterality: Left;  . CRANIOTOMY Left 09/29/2018   Procedure: Sterotactic Left craniotomy for resection of tumor;  Surgeon: Cody [name], Neelesh, MD;  Location: [facility] OR;  Service: Neurosurgery;  Laterality: Left;  Sterotactic Left craniotomy for resection of tumor   Social History:  Social History   Socioeconomic History  . Marital status: Married    Spouse name: Not on file  . Number of children: Not on file  . Years of education: Not on file  . Highest education level: Not on file  Occupational History  . Not on file  Tobacco Use  . Smoking status: Never Smoker  . Smokeless tobacco: Never Used  Vaping Use  . Vaping Use: Never used  Substance and Sexual Activity  . Alcohol use: Never  . Drug use: Never  . Sexual activity: Not on file  Other Topics Concern  . Not on file  Social History Narrative  . Not on file   Social Determinants of Health   Financial Resource Strain:   . Difficulty of Paying Living Expenses:   Food Insecurity:   . Worried About Running Out of Food in the Last Year:   . Ran Out of Food in the Last Year:   Transportation Needs: Unmet Transportation Needs  . Lack of Transportation  (Medical): Yes  . Lack of Transportation (Non-Medical): No  Physical Activity:   . Days of Exercise per Week:   . Minutes of Exercise per Session:   Stress:   . Feeling of Stress :   Social Connections:   . Frequency of Communication with Friends and Family:   . Frequency of Social Gatherings with Friends and Family:   . Attends Religious Services:   . Active Member of Clubs or Organizations:   . Attends Club or Organization Meetings:   . Marital Status:   Intimate Partner Violence: Not At Risk  . Fear of Current or Ex-Partner: No  . Emotionally Abused: No  . Physically Abused: No  . Sexually Abused: No   Family History: No family history on file.  Review of Systems: Constitutional: Denies fevers, chills or abnormal weight loss Eyes: Denies blurriness of vision Ears, nose, mouth, throat, and face: Denies mucositis or sore throat Respiratory: Denies cough, dyspnea or wheezes Cardiovascular: Denies palpitation,   chest discomfort or lower extremity swelling Gastrointestinal:  Denies nausea, constipation, diarrhea GU: Denies dysuria or incontinence Skin: Denies abnormal skin rashes Neurological: Per HPI Musculoskeletal: Denies joint pain, back or neck discomfort. No decrease in ROM Behavioral/Psych: Denies anxiety, disturbance in thought content, and mood instability  Physical Exam: Vitals:   09/07/19 1233  BP: (!) 156/90  Pulse: (!) 55  Resp: 20  Temp: 97.9 F (36.6 C)  SpO2: 100%   KPS: 80. General: Alert, cooperative, pleasant, in no acute distress Head: Craniotomy scar noted, dry and intact. EENT: No conjunctival injection or scleral icterus. Oral mucosa moist Lungs: Resp effort normal Cardiac: Regular rate and rhythm Abdomen: Soft, non-distended abdomen Skin: No rashes cyanosis or petechiae. Extremities: No clubbing or edema  Neurologic Exam: Mental Status: Awake, alert, attentive to examiner. Oriented to self and environment. Moderate mixed dysphasia  motor>sensory. Cranial Nerves: Visual acuity is grossly normal. Right homonymous hemianopia. Extra-ocular movements intact. No ptosis. Face is symmetric, tongue midline. Motor: Tone and bulk are normal. Power is full in both arms and legs. Reflexes are symmetric, no pathologic reflexes present. Intact finger to nose bilaterally Sensory: Intact to light touch and temperature Gait: Wide based, dystaxic  Labs: I have reviewed the data as listed    Component Value Date/Time   NA 141 09/07/2019 1154   K 4.2 09/07/2019 1154   CL 108 09/07/2019 1154   CO2 22 09/07/2019 1154   GLUCOSE 170 (H) 09/07/2019 1154   BUN 16 09/07/2019 1154   CREATININE 1.23 09/07/2019 1154   CALCIUM 9.2 09/07/2019 1154   PROT 6.7 09/07/2019 1154   ALBUMIN 3.7 09/07/2019 1154   AST 12 (L) 09/07/2019 1154   ALT 21 09/07/2019 1154   ALKPHOS 92 09/07/2019 1154   BILITOT 0.6 09/07/2019 1154   GFRNONAA >60 09/07/2019 1154   GFRAA >60 09/07/2019 1154   Lab Results  Component Value Date   WBC 13.2 (H) 09/07/2019   NEUTROABS 12.3 (H) 09/07/2019   HGB 15.1 09/07/2019   HCT 42.4 09/07/2019   MCV 87.2 09/07/2019   PLT 117 (L) 09/07/2019    Assessment/Plan Glioblastoma ([facility]) - Plan: DISCONTINUED: 0.9 %  sodium chloride infusion, DISCONTINUED: bevacizumab-bvzr (ZIRABEV) 900 mg in sodium chloride 0.9 % 100 mL chemo infusion  Focal seizures ([facility])  Mr. Veach is clinically stable today, no new or progressive deficits.  Labs are within normal limits.  He will proceed with todays avastin infusion 10mg/kg.  Avastin continues to control enhancing burden of disease and can be continued in the interim as a quality of life measure.  Avastin should be held for the following:  ANC less than 500  Platelets less than 50,000  LFT or creatinine greater than 2x ULN  If clinical concerns/contraindications develop  For headaches, ok to continue dosing decadron at 4mg daily for now.  This medication is providing benefit as a  quality of life measeure.  We appreciate the opportunity to participate in the care of Advith P Soltis.    We recommend he return to clinic in 2 weeks following next MRI brain.   All questions were answered. The patient knows to call the clinic with any problems, questions or concerns. No barriers to learning were detected.  I have spent a total of 30 minutes of face-to-face and non-face-to-face time, excluding clinical staff time, preparing to see patient, ordering tests and/or medications, counseling the patient, and independently interpreting results and communicating results to the patient/family/caregiver    Zachary K Vaslow, MD Medical   Director of Neuro-Oncology [facility] [place] 09/07/19 4:07 PM 

## 2019-09-07 NOTE — Patient Instructions (Signed)
Bevacizumab injection What is this medicine? BEVACIZUMAB (be va SIZ yoo mab) is a monoclonal antibody. It is used to treat many types of cancer. This medicine may be used for other purposes; ask your health care provider or pharmacist if you have questions. COMMON BRAND NAME(S): Avastin, MVASI, Zirabev What should I tell my health care provider before I take this medicine? They need to know if you have any of these conditions:  diabetes  heart disease  high blood pressure  history of coughing up blood  prior anthracycline chemotherapy (e.g., doxorubicin, daunorubicin, epirubicin)  recent or ongoing radiation therapy  recent or planning to have surgery  stroke  an unusual or allergic reaction to bevacizumab, hamster proteins, mouse proteins, other medicines, foods, dyes, or preservatives  pregnant or trying to get pregnant  breast-feeding How should I use this medicine? This medicine is for infusion into a vein. It is given by a health care professional in a hospital or clinic setting. Talk to your pediatrician regarding the use of this medicine in children. Special care may be needed. Overdosage: If you think you have taken too much of this medicine contact a poison control center or emergency room at once. NOTE: This medicine is only for you. Do not share this medicine with others. What if I miss a dose? It is important not to miss your dose. Call your doctor or health care professional if you are unable to keep an appointment. What may interact with this medicine? Interactions are not expected. This list may not describe all possible interactions. Give your health care provider a list of all the medicines, herbs, non-prescription drugs, or dietary supplements you use. Also tell them if you smoke, drink alcohol, or use illegal drugs. Some items may interact with your medicine. What should I watch for while using this medicine? Your condition will be monitored carefully while  you are receiving this medicine. You will need important blood work and urine testing done while you are taking this medicine. This medicine may increase your risk to bruise or bleed. Call your doctor or health care professional if you notice any unusual bleeding. Before having surgery, talk to your health care provider to make sure it is ok. This drug can increase the risk of poor healing of your surgical site or wound. You will need to stop this drug for 28 days before surgery. After surgery, wait at least 28 days before restarting this drug. Make sure the surgical site or wound is healed enough before restarting this drug. Talk to your health care provider if questions. Do not become pregnant while taking this medicine or for 6 months after stopping it. Women should inform their doctor if they wish to become pregnant or think they might be pregnant. There is a potential for serious side effects to an unborn child. Talk to your health care professional or pharmacist for more information. Do not breast-feed an infant while taking this medicine and for 6 months after the last dose. This medicine has caused ovarian failure in some women. This medicine may interfere with the ability to have a child. You should talk to your doctor or health care professional if you are concerned about your fertility. What side effects may I notice from receiving this medicine? Side effects that you should report to your doctor or health care professional as soon as possible:  allergic reactions like skin rash, itching or hives, swelling of the face, lips, or tongue  chest pain or chest tightness    chills  coughing up blood  high fever  seizures  severe constipation  signs and symptoms of bleeding such as bloody or black, tarry stools; red or dark-brown urine; spitting up blood or brown material that looks like coffee grounds; red spots on the skin; unusual bruising or bleeding from the eye, gums, or nose  signs  and symptoms of a blood clot such as breathing problems; chest pain; severe, sudden headache; pain, swelling, warmth in the leg  signs and symptoms of a stroke like changes in vision; confusion; trouble speaking or understanding; severe headaches; sudden numbness or weakness of the face, arm or leg; trouble walking; dizziness; loss of balance or coordination  stomach pain  sweating  swelling of legs or ankles  vomiting  weight gain Side effects that usually do not require medical attention (report to your doctor or health care professional if they continue or are bothersome):  back pain  changes in taste  decreased appetite  dry skin  nausea  tiredness This list may not describe all possible side effects. Call your doctor for medical advice about side effects. You may report side effects to FDA at 1-800-FDA-1088. Where should I keep my medicine? This drug is given in a hospital or clinic and will not be stored at home. NOTE: This sheet is a summary. It may not cover all possible information. If you have questions about this medicine, talk to your doctor, pharmacist, or health care provider.  2020 Elsevier/Gold Standard (2018-11-16 10:50:46)  

## 2019-09-08 ENCOUNTER — Telehealth: Payer: Self-pay | Admitting: Internal Medicine

## 2019-09-08 NOTE — Telephone Encounter (Signed)
Scheduled per 8/5 los. Unable to reach pt. Left voicemail with appt time and date. 

## 2019-09-20 ENCOUNTER — Other Ambulatory Visit: Payer: BC Managed Care – PPO

## 2019-09-20 ENCOUNTER — Ambulatory Visit
Admission: RE | Admit: 2019-09-20 | Discharge: 2019-09-20 | Disposition: A | Discharge status: Home / Self Care | Payer: BC Managed Care – PPO | Source: Ambulatory Visit | Attending: Internal Medicine | Admitting: Internal Medicine

## 2019-09-20 DIAGNOSIS — C719 Malignant neoplasm of brain, unspecified: Secondary | ICD-10-CM

## 2019-09-20 IMAGING — MR MR HEAD WO/W CM
13 series · 48 of 48 positions shown · IV contrast (multihance)
Comparison: MRI head [DATE]

CLINICAL DATA: Glioblastoma. Post resection and radiation.
Currently on Avastin

EXAM:
MRI HEAD WITHOUT AND WITH CONTRAST
TECHNIQUE: Multiplanar, multiecho pulse sequences of the brain and surrounding
structures were obtained without and with intravenous contrast.
CONTRAST:  15mL MULTIHANCE GADOBENATE DIMEGLUMINE 529 MG/ML IV SOLN

[Series 5: T1 · sagittal · 4.0mm · 0.94mm/px · 2 of 31 slices shown (1 of 3)]
[im 1/31]
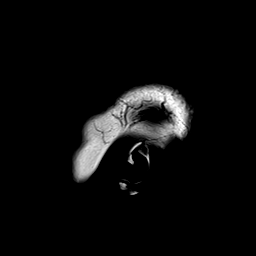
[im 31/31]
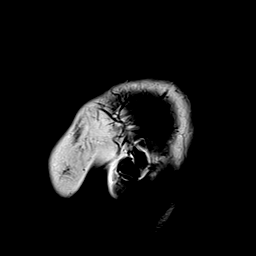

[Series 6: DWI · axial · 3.0mm · 1.44mm/px · z∈[-76,+83]mm · 6 of 98 slices shown (1 of 4)]
[im 1/98]
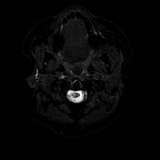
[im 20/98]
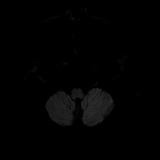
[im 39/98]
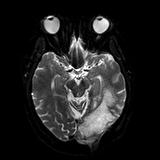
[im 59/98]
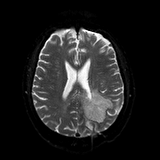
[im 78/98]
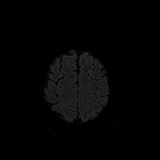
[im 98/98]
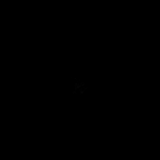

[Series 7: DWI · axial · 3.0mm · 1.44mm/px · z∈[-76,+83]mm · 3 of 49 slices shown (2 of 4)]
[im 1/49]
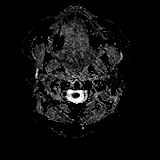
[im 25/49]
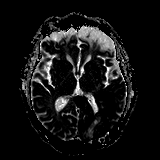
[im 49/49]
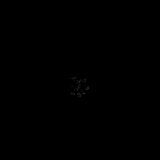

[Series 8: DWI · coronal · 5.0mm · 1.44mm/px · 3 of 58 slices shown (3 of 4)]
[im 1/58]
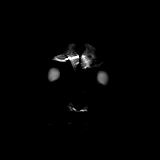
[im 29/58]
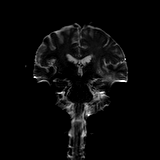
[im 58/58]
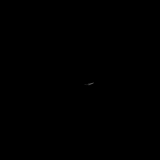

[Series 9: DWI · coronal · 5.0mm · 1.44mm/px · 2 of 30 slices shown (4 of 4)]
[im 1/30]
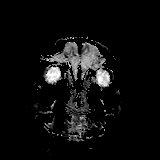
[im 30/30]
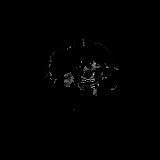

[Series 10: T2 · axial · 4.0mm · 0.36mm/px · z∈[-86,+75]mm · 2 of 32 slices shown]
[im 1/32]
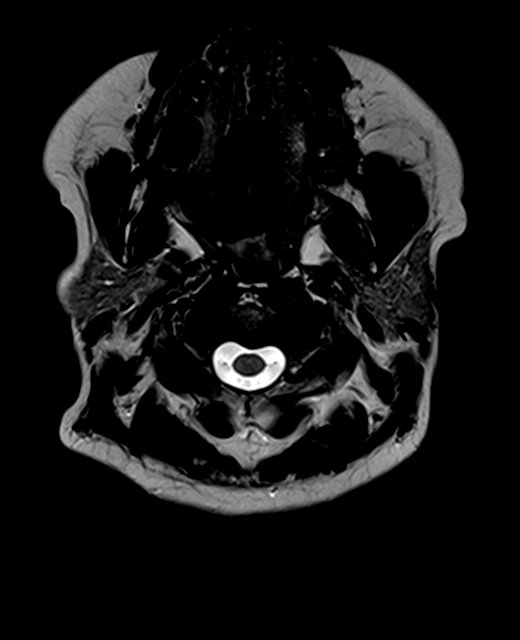
[im 32/32]
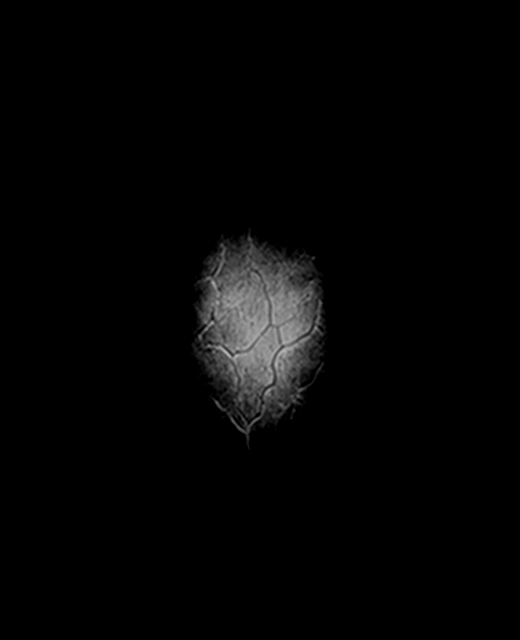

[Series 11: FLAIR · axial · 3.0mm · 0.72mm/px · 1 of 26 slices shown]
[im 1/26]
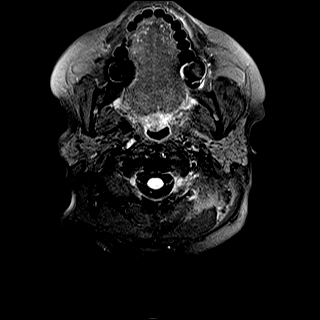

[Series 13: swi_images · axial · 1.5mm · 0.90mm/px · z∈[-77,+66]mm · 5 of 96 slices shown]
[im 1/96]
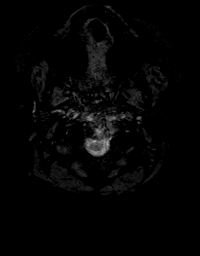
[im 24/96]
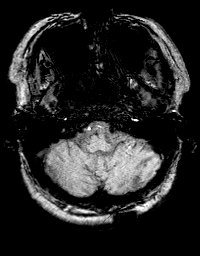
[im 48/96]
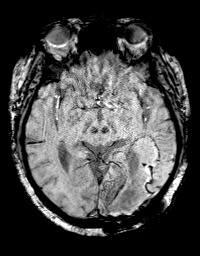
[im 72/96]
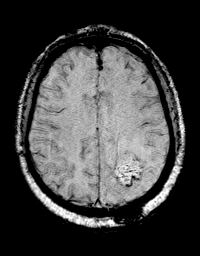
[im 96/96]
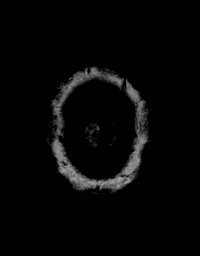

[Series 14: T1 · axial · 1.0mm · 0.94mm/px · z∈[-90,+69]mm · 9 of 160 slices shown (2 of 3)]
[im 1/160]
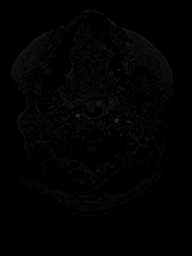
[im 20/160]
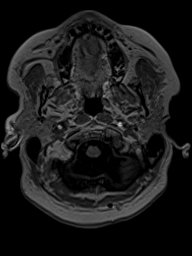
[im 40/160]
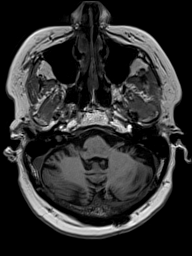
[im 60/160]
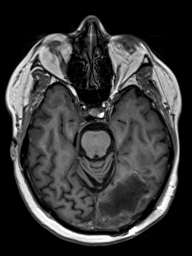
[im 80/160]
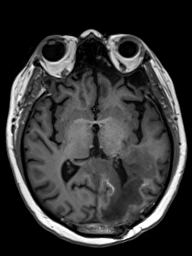
[im 100/160]
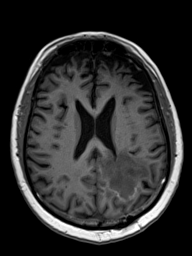
[im 120/160]
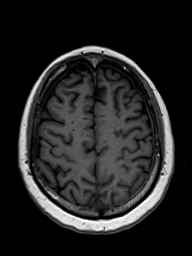
[im 140/160]
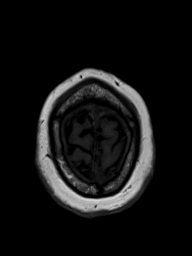
[im 160/160]
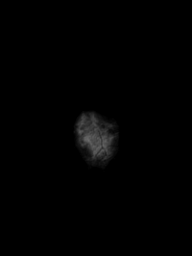

[Series 15: T2 post-contrast · coronal · 4.5mm · 0.36mm/px · 2 of 35 slices shown]
[im 1/35]
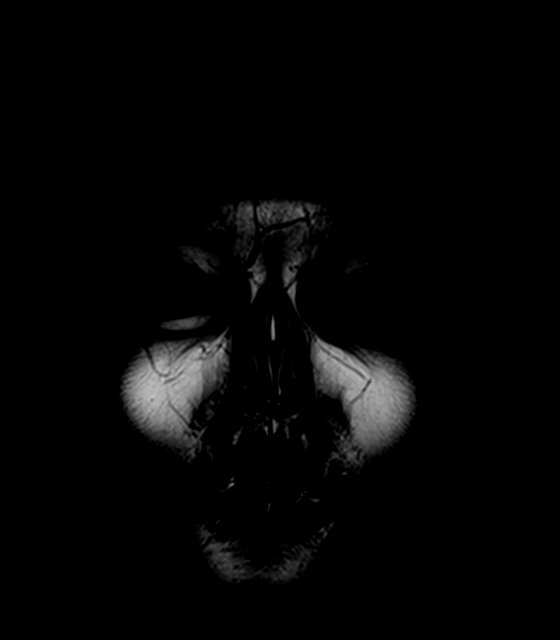
[im 35/35]
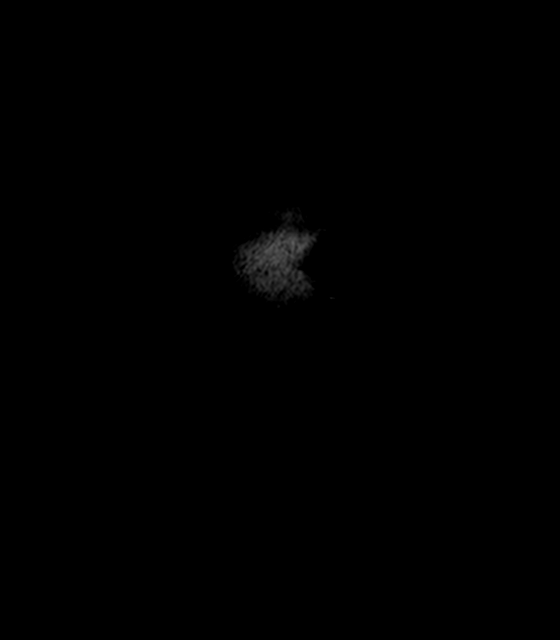

[Series 16: T1 · axial · 1.0mm · 0.94mm/px · z∈[-90,+69]mm · 9 of 160 slices shown (3 of 3)]
[im 1/160]
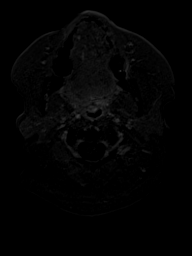
[im 20/160]
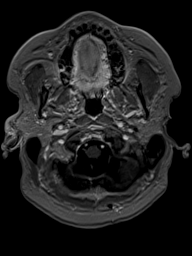
[im 40/160]
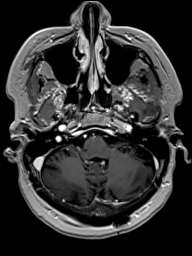
[im 60/160]
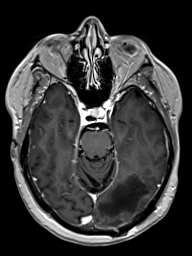
[im 80/160]
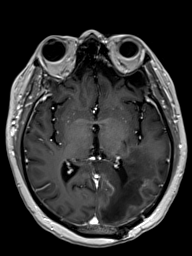
[im 100/160]
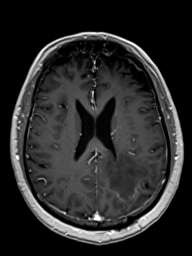
[im 120/160]
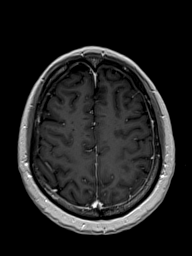
[im 140/160]
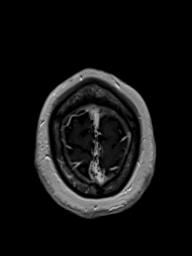
[im 160/160]
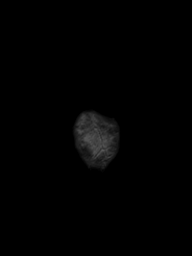

[Series 17: T1 post-contrast · coronal · 4.5mm · 0.72mm/px · 2 of 35 slices shown (1 of 2)]
[im 1/35]
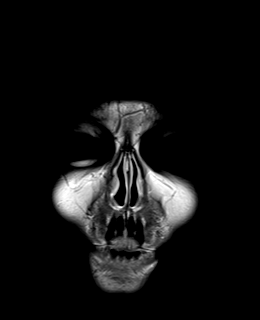
[im 35/35]
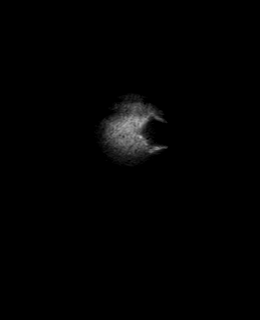

[Series 18: T1 post-contrast · sagittal · 4.0mm · 0.94mm/px · 2 of 31 slices shown (2 of 2)]
[im 1/31]
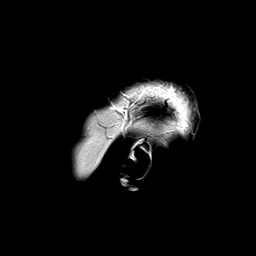
[im 31/31]
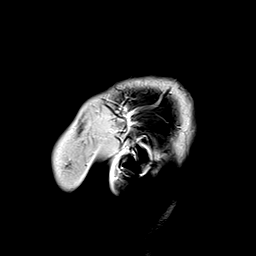

[48 of 48 positions shown; findings below may reference images not displayed]

FINDINGS: Brain: Left occipital mass shows interval enlargement. There is
progressive growth in the splenium now crossing the midline. As
noted previously, there is mild peripheral enhancement of the mass
likely due to Avastin therapy. There is mild nonenhancing white
matter hyperintensity surrounding the mass. No midline shift. The
mass shows extensive restricted diffusion similar to the prior
study.

Ventricle size normal. Chronic encephalomalacia in the frontal lobes
bilaterally likely due to prior trauma.

Vascular: Normal arterial flow voids

Skull and upper cervical spine: Left occipital craniotomy.

Sinuses/Orbits: Paranasal sinuses clear.  Negative orbit

Other: None
IMPRESSION: Progressive growth of mass in the left occipital lobe. Progressive
growth in the splenium with abnormality now crossing the midline to
the right. Minimal enhancement due to Avastin treatment.
Differential diagnosis includes progressive tumor growth versus
inflammatory response from treatment

## 2019-09-20 MED ORDER — GADOBENATE DIMEGLUMINE 529 MG/ML IV SOLN
15.0000 mL | Freq: Once | INTRAVENOUS | Status: AC | PRN
  Administered 2019-09-20: 15 mL via INTRAVENOUS

## 2019-09-21 ENCOUNTER — Inpatient Hospital Stay (HOSPITAL_BASED_OUTPATIENT_CLINIC_OR_DEPARTMENT_OTHER): Payer: BC Managed Care – PPO | Admitting: Internal Medicine

## 2019-09-21 ENCOUNTER — Other Ambulatory Visit: Payer: Self-pay

## 2019-09-21 ENCOUNTER — Encounter: Payer: Self-pay | Admitting: Internal Medicine

## 2019-09-21 ENCOUNTER — Inpatient Hospital Stay: Payer: BC Managed Care – PPO

## 2019-09-21 VITALS — BP 161/92 | HR 64 | Temp 97.9°F | Resp 17 | Ht 72.0 in | Wt 187.3 lb

## 2019-09-21 DIAGNOSIS — Z7189 Other specified counseling: Secondary | ICD-10-CM

## 2019-09-21 DIAGNOSIS — C714 Malignant neoplasm of occipital lobe: Secondary | ICD-10-CM | POA: Diagnosis not present

## 2019-09-21 DIAGNOSIS — C719 Malignant neoplasm of brain, unspecified: Secondary | ICD-10-CM | POA: Diagnosis not present

## 2019-09-21 DIAGNOSIS — R569 Unspecified convulsions: Secondary | ICD-10-CM | POA: Diagnosis not present

## 2019-09-21 LAB — CMP (CANCER CENTER ONLY)
ALT: 26 U/L (ref 0–44)
AST: 12 U/L — ABNORMAL LOW (ref 15–41)
Albumin: 3.6 g/dL (ref 3.5–5.0)
Alkaline Phosphatase: 97 U/L (ref 38–126)
Anion gap: 9 (ref 5–15)
BUN: 14 mg/dL (ref 6–20)
CO2: 23 mmol/L (ref 22–32)
Calcium: 9.4 mg/dL (ref 8.9–10.3)
Chloride: 108 mmol/L (ref 98–111)
Creatinine: 1.18 mg/dL (ref 0.61–1.24)
GFR, Est AFR Am: >60 mL/min (ref >60)
GFR, Estimated: >60 mL/min (ref >60)
Glucose, Bld: 110 mg/dL — ABNORMAL HIGH (ref 70–99)
Potassium: 4 mmol/L (ref 3.5–5.1)
Sodium: 140 mmol/L (ref 135–145)
Total Bilirubin: 0.6 mg/dL (ref 0.3–1.2)
Total Protein: 6.4 g/dL — ABNORMAL LOW (ref 6.5–8.1)

## 2019-09-21 LAB — CBC WITH DIFFERENTIAL (CANCER CENTER ONLY)
Abs Immature Granulocytes: 0.15 10*3/uL — ABNORMAL HIGH (ref 0.00–0.07)
Basophils Absolute: 0 10*3/uL (ref 0.0–0.1)
Basophils Relative: 0 %
Eosinophils Absolute: 0 10*3/uL (ref 0.0–0.5)
Eosinophils Relative: 0 %
HCT: 43.3 % (ref 39.0–52.0)
Hemoglobin: 15.3 g/dL (ref 13.0–17.0)
Immature Granulocytes: 1 %
Lymphocytes Relative: 8 %
Lymphs Abs: 1.1 10*3/uL (ref 0.7–4.0)
MCH: 31.4 pg (ref 26.0–34.0)
MCHC: 35.3 g/dL (ref 30.0–36.0)
MCV: 88.7 fL (ref 80.0–100.0)
Monocytes Absolute: 0.8 10*3/uL (ref 0.1–1.0)
Monocytes Relative: 6 %
Neutro Abs: 12.2 10*3/uL — ABNORMAL HIGH (ref 1.7–7.7)
Neutrophils Relative %: 85 %
Platelet Count: 124 10*3/uL — ABNORMAL LOW (ref 150–400)
RBC: 4.88 MIL/uL (ref 4.22–5.81)
RDW: 14.2 % (ref 11.5–15.5)
WBC Count: 14.3 10*3/uL — ABNORMAL HIGH (ref 4.0–10.5)
nRBC: 0 % (ref 0.0–0.2)

## 2019-09-21 LAB — TOTAL PROTEIN, URINE DIPSTICK: Protein, ur: NEGATIVE mg/dL

## 2019-09-21 MED ORDER — SODIUM CHLORIDE 0.9 % IV SOLN
Freq: Once | INTRAVENOUS | Status: AC
  Filled 2019-09-21: qty 250

## 2019-09-21 MED ORDER — SODIUM CHLORIDE 0.9 % IV SOLN
10.0000 mg/kg | Freq: Once | INTRAVENOUS | Status: AC
  Administered 2019-09-21: 900 mg via INTRAVENOUS
  Filled 2019-09-21: qty 32

## 2019-09-21 NOTE — Patient Instructions (Signed)
Bevacizumab injection What is this medicine? BEVACIZUMAB (be va SIZ yoo mab) is a monoclonal antibody. It is used to treat many types of cancer. This medicine may be used for other purposes; ask your health care provider or pharmacist if you have questions. COMMON BRAND NAME(S): Avastin, MVASI, Zirabev What should I tell my health care provider before I take this medicine? They need to know if you have any of these conditions:  diabetes  heart disease  high blood pressure  history of coughing up blood  prior anthracycline chemotherapy (e.g., doxorubicin, daunorubicin, epirubicin)  recent or ongoing radiation therapy  recent or planning to have surgery  stroke  an unusual or allergic reaction to bevacizumab, hamster proteins, mouse proteins, other medicines, foods, dyes, or preservatives  pregnant or trying to get pregnant  breast-feeding How should I use this medicine? This medicine is for infusion into a vein. It is given by a health care professional in a hospital or clinic setting. Talk to your pediatrician regarding the use of this medicine in children. Special care may be needed. Overdosage: If you think you have taken too much of this medicine contact a poison control center or emergency room at once. NOTE: This medicine is only for you. Do not share this medicine with others. What if I miss a dose? It is important not to miss your dose. Call your doctor or health care professional if you are unable to keep an appointment. What may interact with this medicine? Interactions are not expected. This list may not describe all possible interactions. Give your health care provider a list of all the medicines, herbs, non-prescription drugs, or dietary supplements you use. Also tell them if you smoke, drink alcohol, or use illegal drugs. Some items may interact with your medicine. What should I watch for while using this medicine? Your condition will be monitored carefully while  you are receiving this medicine. You will need important blood work and urine testing done while you are taking this medicine. This medicine may increase your risk to bruise or bleed. Call your doctor or health care professional if you notice any unusual bleeding. Before having surgery, talk to your health care provider to make sure it is ok. This drug can increase the risk of poor healing of your surgical site or wound. You will need to stop this drug for 28 days before surgery. After surgery, wait at least 28 days before restarting this drug. Make sure the surgical site or wound is healed enough before restarting this drug. Talk to your health care provider if questions. Do not become pregnant while taking this medicine or for 6 months after stopping it. Women should inform their doctor if they wish to become pregnant or think they might be pregnant. There is a potential for serious side effects to an unborn child. Talk to your health care professional or pharmacist for more information. Do not breast-feed an infant while taking this medicine and for 6 months after the last dose. This medicine has caused ovarian failure in some women. This medicine may interfere with the ability to have a child. You should talk to your doctor or health care professional if you are concerned about your fertility. What side effects may I notice from receiving this medicine? Side effects that you should report to your doctor or health care professional as soon as possible:  allergic reactions like skin rash, itching or hives, swelling of the face, lips, or tongue  chest pain or chest tightness    chills  coughing up blood  high fever  seizures  severe constipation  signs and symptoms of bleeding such as bloody or black, tarry stools; red or dark-brown urine; spitting up blood or brown material that looks like coffee grounds; red spots on the skin; unusual bruising or bleeding from the eye, gums, or nose  signs  and symptoms of a blood clot such as breathing problems; chest pain; severe, sudden headache; pain, swelling, warmth in the leg  signs and symptoms of a stroke like changes in vision; confusion; trouble speaking or understanding; severe headaches; sudden numbness or weakness of the face, arm or leg; trouble walking; dizziness; loss of balance or coordination  stomach pain  sweating  swelling of legs or ankles  vomiting  weight gain Side effects that usually do not require medical attention (report to your doctor or health care professional if they continue or are bothersome):  back pain  changes in taste  decreased appetite  dry skin  nausea  tiredness This list may not describe all possible side effects. Call your doctor for medical advice about side effects. You may report side effects to FDA at 1-800-FDA-1088. Where should I keep my medicine? This drug is given in a hospital or clinic and will not be stored at home. NOTE: This sheet is a summary. It may not cover all possible information. If you have questions about this medicine, talk to your doctor, pharmacist, or health care provider.  2020 Elsevier/Gold Standard (2018-11-16 10:50:46)  

## 2019-09-21 NOTE — Progress Notes (Signed)
[facility] [place], [place] [address] [phone]   Interval Evaluation  Date of Service: 09/21/19 Patient Name: Cody [name] Patient MRN: [id number] Patient DOB: April 10, 1960 Provider: Ventura Sellers, MD  Identifying Statement:  Cody [name] is a 59 y.o. male with left occipital glioblastoma   Oncologic History: Oncology History  Glioblastoma ([place])  09/29/2018 Surgery   Craniotomy, resection with Dr. Kathyrn Sheriff   10/21/2018 - 10/31/2018 Chemotherapy   The patient had [No matching medication found in this treatment plan]  for chemotherapy treatment.    01/09/2019 - 05/18/2019 Chemotherapy   The patient had [No matching medication found in this treatment plan]  for chemotherapy treatment.    01/23/2019 -  Chemotherapy   The patient had bevacizumab-bvzr (ZIRABEV) 800 mg in sodium chloride 0.9 % 100 mL chemo infusion, 10 mg/kg = 800 mg, Intravenous,  Once, 15 of 15 cycles Administration: 800 mg (01/23/2019), 800 mg (02/09/2019), 900 mg (02/24/2019), 900 mg (03/10/2019), 900 mg (03/24/2019), 900 mg (04/07/2019), 900 mg (04/21/2019), 900 mg (05/05/2019), 900 mg (05/30/2019), 900 mg (06/26/2019), 900 mg (06/12/2019), 900 mg (07/24/2019), 900 mg (08/08/2019), 900 mg (08/24/2019), 900 mg (09/07/2019)  for chemotherapy treatment.    05/30/2019 - 05/30/2019 Chemotherapy   The patient had dexamethasone (DECADRON) 4 MG tablet, 1 of 1 cycle, Start date: --, End date: -- lomustine (CEENU) 10 MG capsule, 10 mg (100 % of original dose 10 mg), Oral,  Once, 1 of 1 cycle, Start date: 05/30/2019, End date: 05/30/2019 Dose modification: 10 mg (original dose 10 mg, Cycle 1) lomustine (CEENU) 100 MG capsule, 100 mg (100 % of original dose 100 mg), Oral,  Once, 1 of 1 cycle, Start date: 05/30/2019, End date: 05/30/2019 Dose modification: 100 mg (original dose 100 mg, Cycle 1) lomustine (CEENU) 40 MG capsule, 80 mg (100 % of original dose 80 mg), Oral,  Once, 1 of  1 cycle, Start date: 05/30/2019, End date: 05/30/2019 Dose modification: 80 mg (original dose 80 mg, Cycle 1)  for chemotherapy treatment.      Biomarkers:  MGMT Unknown.  IDH 1/2 Wild type.  EGFR Unknown  TERT Unknown   Interval History:  Cody [name] presents today for clinical follow up following recent MRI brain.  Wife is at bedside today.  Language and right sided weakness are stable from prior.  Fatigue continues to worsen, and he is currently only awake 4-6 hours per day given his sleep volume.  Activity level is minimal because of little awake time.  Continues on decadron 45m daily.  H+P (10/18/18) Patient presented to medical attention in August 2020 with several weeks history of new onset headaches.  Progressive nature of symptoms led to an ED admission, where CNS imaging demonstrated enhancing mass in the left occipital lobe.  He underwent craniotomy and resection with Dr. [name] 84/40/34without complication.  He was discharged to home with small visual field impairment and otherwise functionally intact.  He denies headaches or any seizures since surgery, having completed decadron taper.    Medications: Current Outpatient Medications on File Prior to Visit  Medication Sig Dispense Refill  . acetaminophen (TYLENOL) 500 MG tablet Take 500 mg by mouth every 6 (six) hours as needed.    .Marland Kitchendexamethasone (DECADRON) 4 MG tablet Take 1 tablet (4 mg total) by mouth daily. 30 tablet 3  . finasteride (PROSCAR) 5 MG tablet Take 5 mg by mouth daily.    .Marland KitchenGLEOSTINE 10  MG capsule Take 10 mg by mouth once. CHEMO every 42 days (Patient not taking: Reported on 08/24/2019)    . GLEOSTINE 100 MG capsule Take 100 mg by mouth once. CHEMO, every 42 days (Patient not taking: Reported on 08/24/2019)    . GLEOSTINE 40 MG capsule Take 2 capsules by mouth See admin instructions. CHEMO, every 42 days (Patient not taking: Reported on 08/24/2019)    . HYDROcodone-acetaminophen (NORCO/VICODIN) 5-325 MG  tablet Take 1 tablet by mouth every 6 (six) hours as needed for moderate pain. (Patient not taking: Reported on 09/07/2019)    . levETIRAcetam (KEPPRA) 500 MG tablet TAKE 2 TABLETS(1000 MG) BY MOUTH TWICE DAILY 360 tablet 1  . oxyCODONE (OXY IR/ROXICODONE) 5 MG immediate release tablet Take 5 mg by mouth every 4 (four) hours as needed for severe pain. (Patient not taking: Reported on 09/07/2019)    . tamsulosin (FLOMAX) 0.4 MG CAPS capsule Take 0.4 mg by mouth daily.     No current facility-administered medications on file prior to visit.    Allergies: No Known Allergies Past Medical History:  Past Medical History:  Diagnosis Date  . Enlarged prostate   . Migraines    Past Surgical History:  Past Surgical History:  Procedure Laterality Date  . APPLICATION OF CRANIAL NAVIGATION Left 09/29/2018   Procedure: APPLICATION OF CRANIAL NAVIGATION;  Surgeon: [name], MD;  Location: [facility];  Service: Neurosurgery;  Laterality: Left;  . CRANIOTOMY Left 09/29/2018   Procedure: Sterotactic Left craniotomy for resection of tumor;  Surgeon: [name], MD;  Location: [facility];  Service: Neurosurgery;  Laterality: Left;  Sterotactic Left craniotomy for resection of tumor   Social History:  Social History   Socioeconomic History  . Marital status: Married    Spouse name: Not on file  . Number of children: Not on file  . Years of education: Not on file  . Highest education level: Not on file  Occupational History  . Not on file  Tobacco Use  . Smoking status: Never Smoker  . Smokeless tobacco: Never Used  Vaping Use  . Vaping Use: Never used  Substance and Sexual Activity  . Alcohol use: Never  . Drug use: Never  . Sexual activity: Not on file  Other Topics Concern  . Not on file  Social History Narrative  . Not on file   Social Determinants of Health   Financial Resource Strain:   . Difficulty of Paying Living Expenses: Not on file  Food Insecurity:   . Worried About  Charity fundraiser in the Last Year: Not on file  . Ran Out of Food in the Last Year: Not on file  Transportation Needs: Unmet Transportation Needs  . Lack of Transportation (Medical): Yes  . Lack of Transportation (Non-Medical): No  Physical Activity:   . Days of Exercise per Week: Not on file  . Minutes of Exercise per Session: Not on file  Stress:   . Feeling of Stress : Not on file  Social Connections:   . Frequency of Communication with Friends and Family: Not on file  . Frequency of Social Gatherings with Friends and Family: Not on file  . Attends Religious Services: Not on file  . Active Member of Clubs or Organizations: Not on file  . Attends Archivist Meetings: Not on file  . Marital Status: Not on file  Intimate Partner Violence: Not At Risk  . Fear of Current or Ex-Partner: No  . Emotionally Abused: No  .  Physically Abused: No  . Sexually Abused: No   Family History: No family history on file.  Review of Systems: Constitutional: Denies fevers, chills or abnormal weight loss Eyes: Denies blurriness of vision Ears, nose, mouth, throat, and face: Denies mucositis or sore throat Respiratory: Denies cough, dyspnea or wheezes Cardiovascular: Denies palpitation, chest discomfort or lower extremity swelling Gastrointestinal:  Denies nausea, constipation, diarrhea GU: Denies dysuria or incontinence Skin: Denies abnormal skin rashes Neurological: Per HPI Musculoskeletal: Denies joint pain, back or neck discomfort. No decrease in ROM Behavioral/Psych: Denies anxiety, disturbance in thought content, and mood instability  Physical Exam: Vitals:   09/21/19 1056  BP: (!) 161/92  Pulse: 64  Resp: 17  Temp: 97.9 F (36.6 C)  SpO2: 100%   KPS: 80. General: Alert, cooperative, pleasant, in no acute distress Head: Craniotomy scar noted, dry and intact. EENT: No conjunctival injection or scleral icterus. Oral mucosa moist Lungs: Resp effort normal Cardiac:  Regular rate and rhythm Abdomen: Soft, non-distended abdomen Skin: No rashes cyanosis or petechiae. Extremities: No clubbing or edema  Neurologic Exam: Mental Status: Awake, alert, attentive to examiner. Oriented to self and environment. Moderate mixed dysphasia motor>sensory. Cranial Nerves: Visual acuity is grossly normal. Right homonymous hemianopia. Extra-ocular movements intact. No ptosis. Face is symmetric, tongue midline. Motor: Tone and bulk are normal. Power is full in both arms and legs. Reflexes are symmetric, no pathologic reflexes present. Intact finger to nose bilaterally Sensory: Intact to light touch and temperature Gait: Wide based, dystaxic  Labs: I have reviewed the data as listed    Component Value Date/Time   NA 141 09/07/2019 1154   K 4.2 09/07/2019 1154   CL 108 09/07/2019 1154   CO2 22 09/07/2019 1154   GLUCOSE 170 (H) 09/07/2019 1154   BUN 16 09/07/2019 1154   CREATININE 1.23 09/07/2019 1154   CALCIUM 9.2 09/07/2019 1154   PROT 6.7 09/07/2019 1154   ALBUMIN 3.7 09/07/2019 1154   AST 12 (L) 09/07/2019 1154   ALT 21 09/07/2019 1154   ALKPHOS 92 09/07/2019 1154   BILITOT 0.6 09/07/2019 1154   GFRNONAA >60 09/07/2019 1154   GFRAA >60 09/07/2019 1154   Lab Results  Component Value Date   WBC 14.3 (H) 09/21/2019   NEUTROABS 12.2 (H) 09/21/2019   HGB 15.3 09/21/2019   HCT 43.3 09/21/2019   MCV 88.7 09/21/2019   PLT 124 (L) 09/21/2019   Imaging:  [facility] Clinician Interpretation: I have personally reviewed the CNS images as listed.  My interpretation, in the context of the patient's clinical presentation, is progressive disease  MR Brain W Wo Contrast  Result Date: 09/20/2019 CLINICAL DATA:  Glioblastoma. Post resection and radiation. Currently on Avastin EXAM: MRI HEAD WITHOUT AND WITH CONTRAST TECHNIQUE: Multiplanar, multiecho pulse sequences of the brain and surrounding structures were obtained without and with intravenous contrast. CONTRAST:  80m  MULTIHANCE GADOBENATE DIMEGLUMINE 529 MG/ML IV SOLN COMPARISON:  MRI head 07/21/2019 FINDINGS: Brain: Left occipital mass shows interval enlargement. There is progressive growth in the splenium now crossing the midline. As noted previously, there is mild peripheral enhancement of the mass likely due to Avastin therapy. There is mild nonenhancing white matter hyperintensity surrounding the mass. No midline shift. The mass shows extensive restricted diffusion similar to the prior study. Ventricle size normal. Chronic encephalomalacia in the frontal lobes bilaterally likely due to prior trauma. Vascular: Normal arterial flow voids Skull and upper cervical spine: Left occipital craniotomy. Sinuses/Orbits: Paranasal sinuses clear.  Negative orbit Other: None  IMPRESSION: Progressive growth of mass in the left occipital lobe. Progressive growth in the splenium with abnormality now crossing the midline to the right. Minimal enhancement due to Avastin treatment. Differential diagnosis includes progressive tumor growth versus inflammatory response from treatment Electronically Signed   By: Franchot Gallo M.D.   On: 09/20/2019 19:55   Assessment/Plan Glioblastoma ([place])  Focal seizures ([place])  Goals of care, counseling/discussion  Cody [name] is clinically stable today, no new or progressive deficits. MRI demonstrates further non-enhancing progression within the corpus callosum and left occipital lobe.  We again discussed goals of care and re-introduced consideration for cytotoxic chemotherapy given failure of avastin to control this region of the tumor.  He has refused salvage chemotherapy in the past.  He will proceed with todays avastin infusion 43m/kg.  Avastin continues to control enhancing burden of disease and can be continued in the interim as a quality of life measure.  Avastin should be held for the following:  ANC less than 500  Platelets less than 50,000  LFT or creatinine greater than 2x ULN   If clinical concerns/contraindications develop  May continue decadron at 422mdaily for now.  This medication is providing benefit as a quality of life measeure.  We appreciate the opportunity to participate in the care of Cody [name]   We recommend he return to clinic in 2 weeks for avastin infusion.  They will call [place] interested in adding additional chemotherapy.  All questions were answered. The patient knows to call the clinic with any problems, questions or concerns. No barriers to learning were detected.  I have spent a total of 40 minutes of face-to-face and non-face-to-face time, excluding clinical staff time, preparing to see patient, ordering tests and/or medications, counseling the patient, and independently interpreting results and communicating results to the patient/family/caregiver   ZaVentura SellersMD Medical Director of Neuro-Oncology [facility] [place]8/19/21 10:53 AM

## 2019-09-22 ENCOUNTER — Other Ambulatory Visit: Payer: Self-pay | Admitting: Internal Medicine

## 2019-09-22 ENCOUNTER — Telehealth: Payer: Self-pay | Admitting: Internal Medicine

## 2019-09-22 MED ORDER — METHYLPHENIDATE HCL 5 MG PO TABS
5.0000 mg | ORAL_TABLET | Freq: Two times a day (BID) | ORAL | 0 refills | Status: DC
Start: 2019-09-22 — End: 2019-11-15

## 2019-09-22 NOTE — Telephone Encounter (Signed)
No appointments scheduled per 8/19 los. No check out notes provided.

## 2019-09-27 ENCOUNTER — Telehealth: Payer: Self-pay | Admitting: Internal Medicine

## 2019-09-27 NOTE — Telephone Encounter (Signed)
Scheduled per 8/24 staff message. Unable to reach pt. Left voicemail with appt time and date.

## 2019-09-28 ENCOUNTER — Telehealth: Payer: Self-pay | Admitting: *Deleted

## 2019-09-28 ENCOUNTER — Emergency Department (HOSPITAL_COMMUNITY)
Admission: EM | Admit: 2019-09-28 | Discharge: 2019-09-28 | Disposition: A | Discharge status: Home / Self Care | Payer: BC Managed Care – PPO | Attending: Emergency Medicine | Admitting: Emergency Medicine

## 2019-09-28 ENCOUNTER — Other Ambulatory Visit: Payer: Self-pay

## 2019-09-28 ENCOUNTER — Encounter (HOSPITAL_COMMUNITY): Payer: Self-pay | Admitting: Emergency Medicine

## 2019-09-28 ENCOUNTER — Emergency Department (HOSPITAL_COMMUNITY): Payer: BC Managed Care – PPO

## 2019-09-28 DIAGNOSIS — Z79899 Other long term (current) drug therapy: Secondary | ICD-10-CM | POA: Diagnosis not present

## 2019-09-28 DIAGNOSIS — R4182 Altered mental status, unspecified: Secondary | ICD-10-CM | POA: Diagnosis not present

## 2019-09-28 DIAGNOSIS — Z20822 Contact with and (suspected) exposure to covid-19: Secondary | ICD-10-CM | POA: Insufficient documentation

## 2019-09-28 DIAGNOSIS — R569 Unspecified convulsions: Secondary | ICD-10-CM | POA: Diagnosis present

## 2019-09-28 DIAGNOSIS — C714 Malignant neoplasm of occipital lobe: Secondary | ICD-10-CM | POA: Insufficient documentation

## 2019-09-28 LAB — CBC WITH DIFFERENTIAL/PLATELET
Abs Immature Granulocytes: 0.19 10*3/uL — ABNORMAL HIGH (ref 0.00–0.07)
Basophils Absolute: 0 10*3/uL (ref 0.0–0.1)
Basophils Relative: 0 %
Eosinophils Absolute: 0 10*3/uL (ref 0.0–0.5)
Eosinophils Relative: 0 %
HCT: 45 % (ref 39.0–52.0)
Hemoglobin: 15.7 g/dL (ref 13.0–17.0)
Immature Granulocytes: 2 %
Lymphocytes Relative: 3 %
Lymphs Abs: 0.3 10*3/uL — ABNORMAL LOW (ref 0.7–4.0)
MCH: 31.5 pg (ref 26.0–34.0)
MCHC: 34.9 g/dL (ref 30.0–36.0)
MCV: 90.2 fL (ref 80.0–100.0)
Monocytes Absolute: 0.2 10*3/uL (ref 0.1–1.0)
Monocytes Relative: 2 %
Neutro Abs: 10.2 10*3/uL — ABNORMAL HIGH (ref 1.7–7.7)
Neutrophils Relative %: 93 %
Platelets: 100 10*3/uL — ABNORMAL LOW (ref 150–400)
RBC: 4.99 MIL/uL (ref 4.22–5.81)
RDW: 13.8 % (ref 11.5–15.5)
WBC: 10.9 10*3/uL — ABNORMAL HIGH (ref 4.0–10.5)
nRBC: 0 % (ref 0.0–0.2)

## 2019-09-28 LAB — URINALYSIS, ROUTINE W REFLEX MICROSCOPIC
Bilirubin Urine: NEGATIVE
Glucose, UA: NEGATIVE mg/dL
Hgb urine dipstick: NEGATIVE
Ketones, ur: NEGATIVE mg/dL
Leukocytes,Ua: NEGATIVE
Nitrite: NEGATIVE
Protein, ur: NEGATIVE mg/dL
Specific Gravity, Urine: 1.008 (ref 1.005–1.030)
pH: 7 (ref 5.0–8.0)

## 2019-09-28 LAB — BASIC METABOLIC PANEL
Anion gap: 10 (ref 5–15)
BUN: 14 mg/dL (ref 6–20)
CO2: 24 mmol/L (ref 22–32)
Calcium: 8.5 mg/dL — ABNORMAL LOW (ref 8.9–10.3)
Chloride: 105 mmol/L (ref 98–111)
Creatinine, Ser: 1.11 mg/dL (ref 0.61–1.24)
GFR calc Af Amer: >60 mL/min (ref >60)
GFR calc non Af Amer: >60 mL/min (ref >60)
Glucose, Bld: 144 mg/dL — ABNORMAL HIGH (ref 70–99)
Potassium: 4.5 mmol/L (ref 3.5–5.1)
Sodium: 139 mmol/L (ref 135–145)

## 2019-09-28 LAB — ETHANOL: Alcohol, Ethyl (B): <10 mg/dL (ref <10)

## 2019-09-28 LAB — PHOSPHORUS: Phosphorus: 2.9 mg/dL (ref 2.5–4.6)

## 2019-09-28 LAB — CBG MONITORING, ED: Glucose-Capillary: 149 mg/dL — ABNORMAL HIGH (ref 70–99)

## 2019-09-28 LAB — RAPID URINE DRUG SCREEN, HOSP PERFORMED
Amphetamines: NOT DETECTED
Barbiturates: NOT DETECTED
Benzodiazepines: POSITIVE — AB
Cocaine: NOT DETECTED
Opiates: NOT DETECTED
Tetrahydrocannabinol: NOT DETECTED

## 2019-09-28 LAB — SARS CORONAVIRUS 2 BY RT PCR (HOSPITAL ORDER, PERFORMED IN ~~LOC~~ HOSPITAL LAB): SARS Coronavirus 2: NEGATIVE

## 2019-09-28 LAB — MAGNESIUM: Magnesium: 2 mg/dL (ref 1.7–2.4)

## 2019-09-28 IMAGING — CT CT HEAD W/O CM
3 series · 15 of 47 positions shown, 18 images · non-contrast
Comparison: Brain MRI from [DATE].

CLINICAL DATA: Initial evaluation for acute seizure.

EXAM:
CT HEAD WITHOUT CONTRAST
TECHNIQUE: Contiguous axial images were obtained from the base of the skull
through the vertex without intravenous contrast.

[Series 2: head wo · axial · 0.43mm/px · z∈[+1418,+1543]mm · 9 of 31 slices shown, 12 images]
[im 3/31  brain]
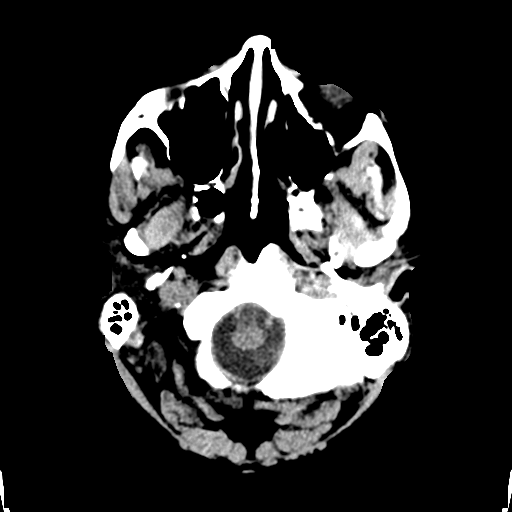
[im 3/31  bone]
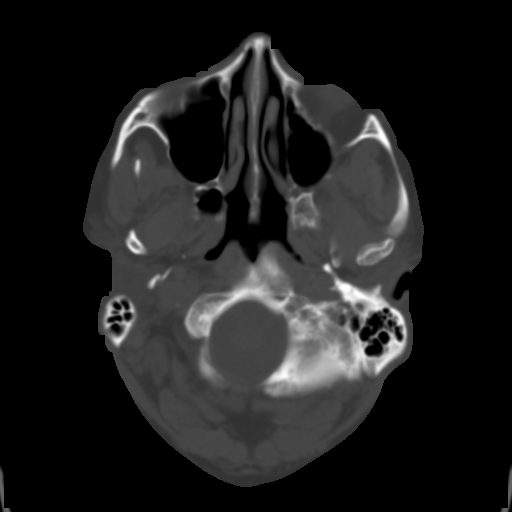
[im 6/31  brain]
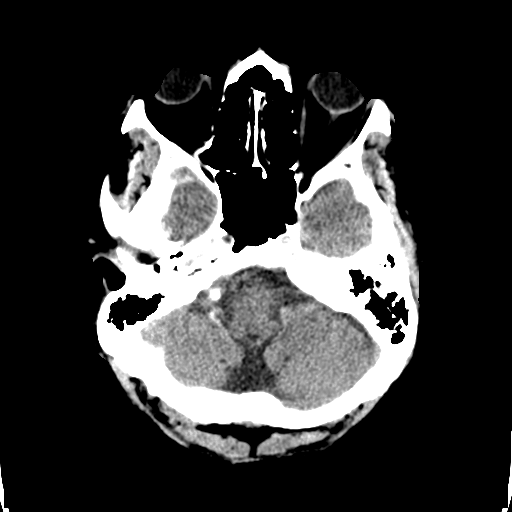
[im 9/31  brain]
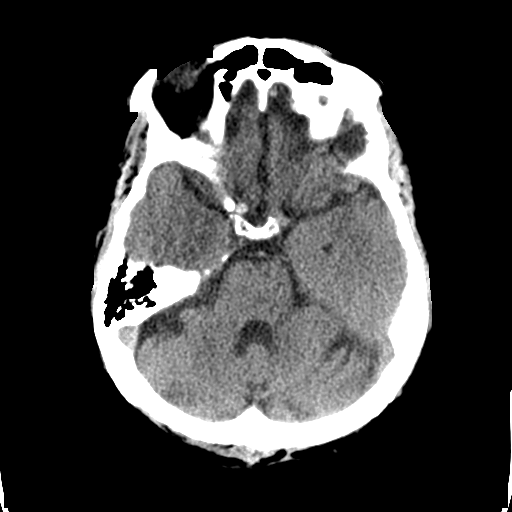
[im 12/31  brain]
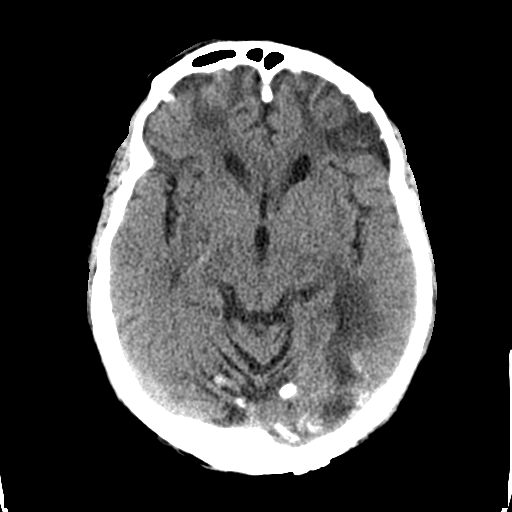
[im 16/31  brain]
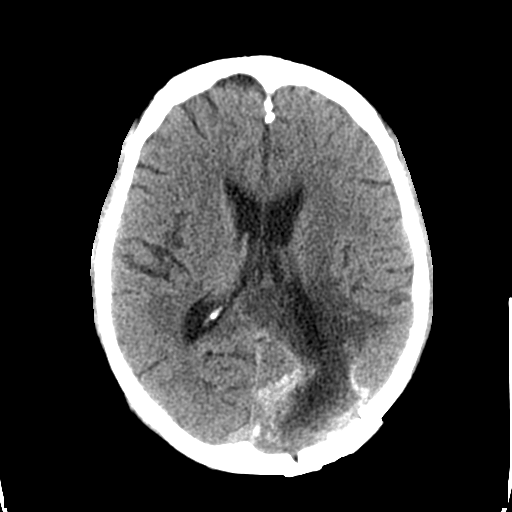
[im 16/31  bone]
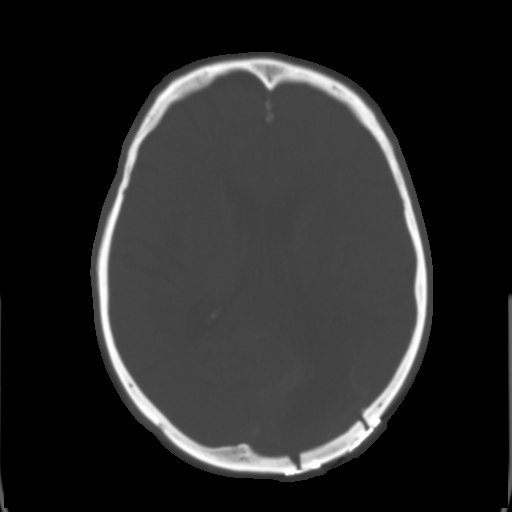
[im 19/31  brain]
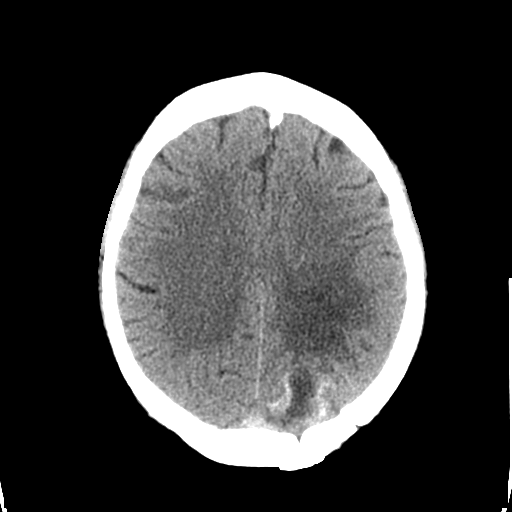
[im 22/31  brain]
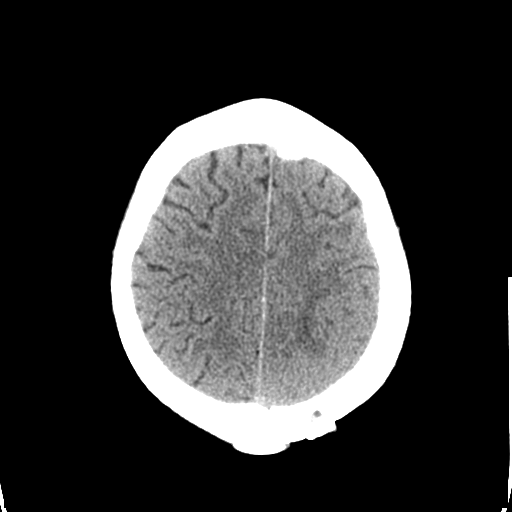
[im 25/31  brain]
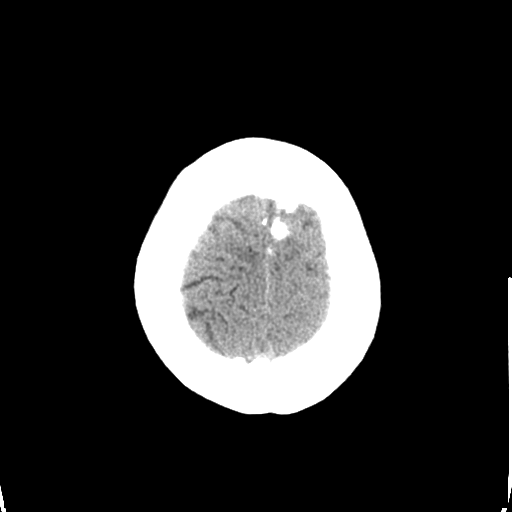
[im 28/31  brain]
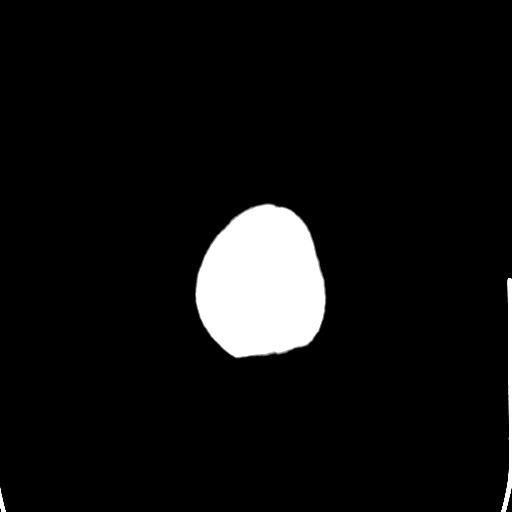
[im 28/31  bone]
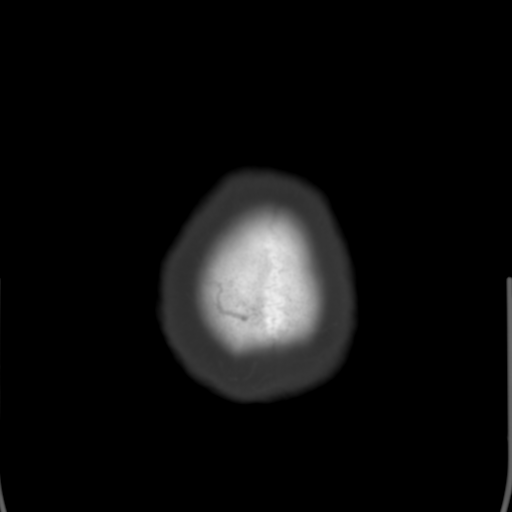

[Series 5: coronal soft tissue · coronal · 0.29mm/px · 3 of 66 slices shown]
[im 22/66  brain]
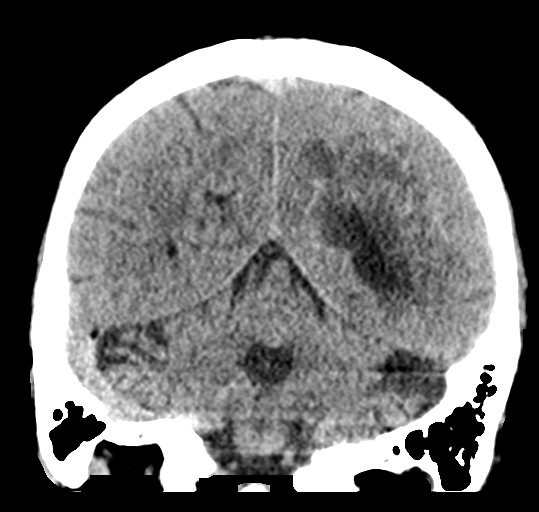
[im 29/66  brain]
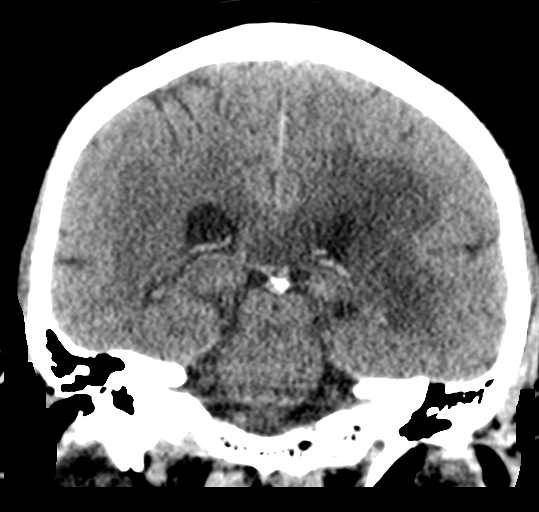
[im 37/66  brain]
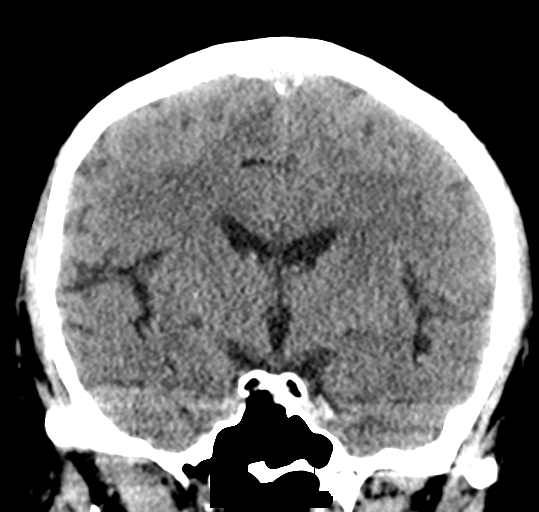

[Series 6: sagittal soft tissue · sagittal · 0.29mm/px · 3 of 53 slices shown]
[im 18/53  brain]
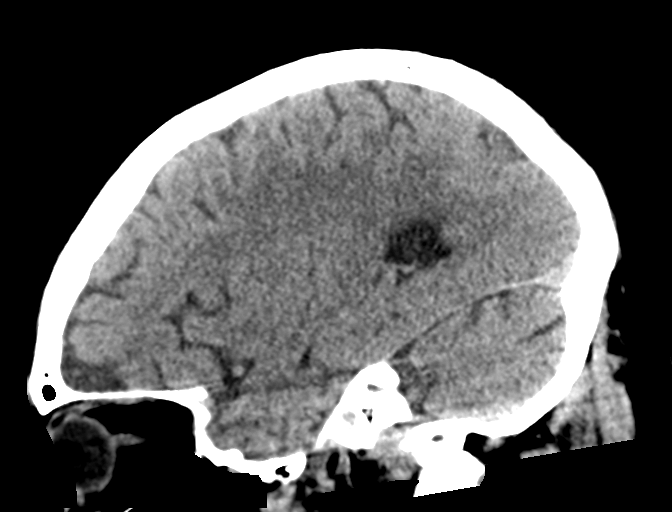
[im 27/53  brain]
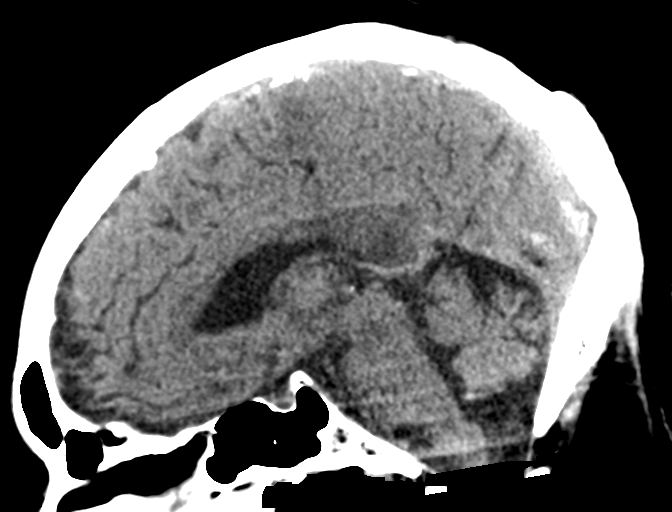
[im 35/53  brain]
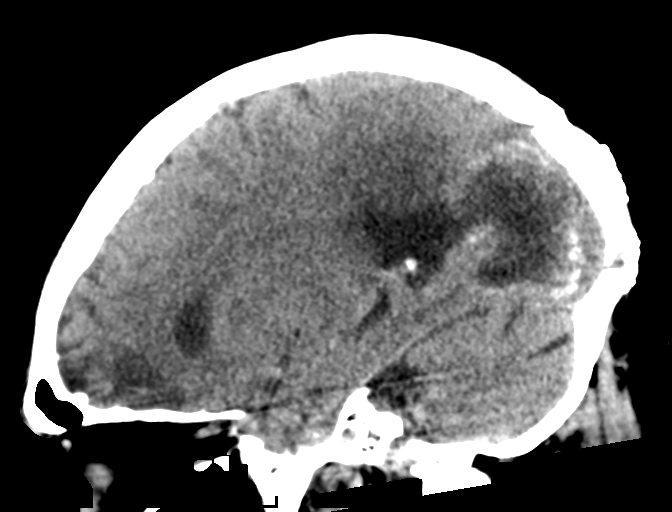

[15 of 47 positions shown; findings below may reference images not displayed]

FINDINGS: Brain: Postoperative changes from prior left parieto-occipital
craniotomy. Underlying parenchymal changes related to patient's
known GBM again seen within the underlying left parieto-occipital
region. Degree of hypodensity within this region is not
significantly changed as compared to recent MRI. Extension into the
splenium and across the midline again noted. Irregular
calcifications noted about the periphery of this lesion posteriorly,
likely post treatment effect. No significant regional mass effect or
midline shift. Overall, appearance is relatively stable from recent
MRI. No evidence for interval hemorrhage or other complication.

No other acute intracranial hemorrhage or large vessel territory
infarct. No hydrocephalus or extra-axial fluid collection. Chronic
bifrontal encephalomalacia again noted, likely related to remote
trauma.

Vascular: No hyperdense vessel. Scattered vascular calcifications
noted within the carotid siphons.

Skull: Prior left posterior craniotomy. Scalp soft tissues
demonstrate no acute finding.

Sinuses/Orbits: Globes and orbital soft tissues within normal
limits. Paranasal sinuses and mastoid air cells are clear.

Other: None.
IMPRESSION: 1. No significant interval change in size and appearance of
patient's known GBM centered at the left occipital lobe with
associated post treatment changes. No significant regional mass
effect or midline shift. No interval hemorrhage or other
complication.
2. No other acute intracranial abnormality.
3. Chronic bifrontal encephalomalacia, likely related to remote
trauma.

## 2019-09-28 MED ORDER — LORAZEPAM 2 MG/ML IJ SOLN
4.0000 mg | INTRAMUSCULAR | Status: AC | PRN
  Administered 2019-09-28 (×2): 2 mg via INTRAVENOUS
  Filled 2019-09-28 (×2): qty 2

## 2019-09-28 MED ORDER — DEXAMETHASONE 4 MG PO TABS: 4.0000 mg | ORAL_TABLET | Freq: Two times a day (BID) | ORAL | 0 refills | Status: DC

## 2019-09-28 MED ORDER — LEVETIRACETAM IN NACL 1000 MG/100ML IV SOLN
1000.0000 mg | Freq: Once | INTRAVENOUS | Status: AC
  Administered 2019-09-28: 1000 mg via INTRAVENOUS
  Filled 2019-09-28: qty 100

## 2019-09-28 MED ORDER — DEXAMETHASONE SODIUM PHOSPHATE 10 MG/ML IJ SOLN
10.0000 mg | Freq: Once | INTRAMUSCULAR | Status: AC
  Administered 2019-09-28: 10 mg via INTRAVENOUS
  Filled 2019-09-28: qty 1

## 2019-09-28 MED ORDER — LEVETIRACETAM 500 MG PO TABS: ORAL_TABLET | ORAL | 0 refills | Status: DC

## 2019-09-28 MED ORDER — SODIUM CHLORIDE 0.9 % IV BOLUS
1000.0000 mL | Freq: Once | INTRAVENOUS | Status: AC
  Administered 2019-09-28: 1000 mL via INTRAVENOUS

## 2019-09-28 MED ORDER — SODIUM CHLORIDE 0.9 % IV SOLN: INTRAVENOUS | Status: DC

## 2019-09-28 NOTE — ED Notes (Addendum)
Patient's wife called me into room stating that she thinks her husband had a seizure. When going to assess patient, he appears to have some minor twitching. Called Dr. Gertie Fey and he stated to try 2mg  IV ativan first. Will continue to monitor.   2040 - Witnessed another mild seizure. Administered another 2mg  IV ativan. Will notify Dr. Gertie Fey. Will continue to monitor.

## 2019-09-28 NOTE — Telephone Encounter (Signed)
Received notification from [facility] that ritalin was approved 09/26/2019-09/26/2022

## 2019-09-28 NOTE — ED Provider Notes (Signed)
[facility] DEPT Provider Note   CSN: [id number] Arrival date & time: 09/28/19  1743     History Chief Complaint  Patient presents with  . Seizures    Cody [name] is a 59 y.o. male.  The history is provided by the spouse and medical records. No language interpreter was used.     59 year old male with history of glioblastoma, focal seizures, currently on Keppra brought here via EMS from home for seizure-like activities.  Per EMS note, patient family reported seizure-like activities including jerking movement in his right arm and right leg as well as right-sided face twitching lasting for nearly an hour.  Patient was at his baseline this morning.  Seizure activity started approximately 2 hours ago.  Patient received 2.5 mg of IV Versed prior to arrival which did slow the frequencies of the movement.  Additional history of limited as patient unable to answer any question appropriately.  Level 5 caveat is due to altered mental status likely from postictal state.  Past Medical History:  Diagnosis Date  . Enlarged prostate   . Migraines     Patient Active Problem List   Diagnosis Date Noted  . Focal seizures ([place]) 01/09/2019  . Goals of care, counseling/discussion 01/09/2019  . BPH (benign prostatic hyperplasia) 10/29/2018  . Acute metabolic encephalopathy 41/66/0630  . Altered mental status   . 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 Glioblastoma ([place]) 09/27/2018    Past Surgical History:  Procedure Laterality Date  . APPLICATION OF CRANIAL NAVIGATION Left 09/29/2018   Procedure: APPLICATION OF CRANIAL NAVIGATION;  Surgeon: [name], MD;  Location: [facility];  Service: Neurosurgery;  Laterality: Left;  . CRANIOTOMY Left 09/29/2018   Procedure: Sterotactic Left craniotomy for resection of tumor;  Surgeon: [name], MD;  Location: [facility];  Service: Neurosurgery;  Laterality: Left;  Sterotactic Left craniotomy for  resection of tumor       No family history on file.  Social History   Tobacco Use  . Smoking status: Never Smoker  . Smokeless tobacco: Never Used  Vaping Use  . Vaping Use: Never used  Substance Use Topics  . Alcohol use: Never  . Drug use: Never    Home Medications Prior to Admission medications   Medication Sig Start Date End Date Taking? Authorizing Provider  acetaminophen (TYLENOL) 500 MG tablet Take 500 mg by mouth every 6 (six) hours as needed.    [provider]  dexamethasone (DECADRON) 4 MG tablet Take 1 tablet (4 mg total) by mouth daily. 09/07/19   Ventura Sellers, MD  finasteride (PROSCAR) 5 MG tablet Take 5 mg by mouth daily.    [provider]  GLEOSTINE 10 MG capsule Take 10 mg by mouth once. CHEMO every 42 days Patient not taking: Reported on 08/24/2019 05/31/19   [provider]  GLEOSTINE 100 MG capsule Take 100 mg by mouth once. CHEMO, every 42 days Patient not taking: Reported on 08/24/2019 05/31/19   [provider]  GLEOSTINE 40 MG capsule Take 2 capsules by mouth See admin instructions. CHEMO, every 42 days Patient not taking: Reported on 08/24/2019 05/31/19   [provider]  HYDROcodone-acetaminophen (NORCO/VICODIN) 5-325 MG tablet Take 1 tablet by mouth every 6 (six) hours as needed for moderate pain. Patient not taking: Reported on 09/07/2019    [provider]  levETIRAcetam (KEPPRA) 500 MG tablet TAKE 2 TABLETS(1000 MG) BY MOUTH TWICE DAILY 05/08/19   Vaslow, Acey Lav, MD  methylphenidate (RITALIN) 5 MG tablet Take 1 tablet (5 mg total) by mouth 2 (two) times daily. 09/22/19   Vaslow, Acey Lav, MD  oxyCODONE (OXY IR/ROXICODONE) 5 MG immediate release tablet Take 5 mg by mouth every 4 (four) hours as needed for severe pain. Patient not taking: Reported on 09/07/2019    [provider]  tamsulosin (FLOMAX) 0.4 MG CAPS capsule Take 0.4 mg by mouth daily. 12/09/18   [provider]     Allergies    Patient has no known allergies.  Review of Systems   Review of Systems  Unable to perform ROS: Mental status change  Endocrine: Negative for heat intolerance.    Physical Exam Updated Vital Signs There were no vitals taken for this visit.  Physical Exam Vitals and nursing note reviewed.  Constitutional:      Appearance: He is well-developed.     Comments: Drowsy in postictal state unable to answer any question  HENT:     Head: Normocephalic and atraumatic.     Mouth/Throat:     Comments: No obvious tongue injury Eyes:     Conjunctiva/sclera: Conjunctivae normal.     Pupils: Pupils are equal, round, and reactive to light.  Cardiovascular:     Rate and Rhythm: Normal rate and regular rhythm.     Pulses: Normal pulses.     Heart sounds: Normal heart sounds.  Pulmonary:     Effort: Pulmonary effort is normal.     Breath sounds: Normal breath sounds.  Abdominal:     Palpations: Abdomen is soft.  Musculoskeletal:     Cervical back: Normal range of motion and neck supple. No rigidity.     Comments: Does not follow commands.  Skin:    Findings: No rash.  Neurological:     Mental Status: He is disoriented.     Comments: In postictal state, unable to follow commands.     ED Results / Procedures / Treatments   Labs (all labs ordered are listed, but only abnormal results are displayed) Labs Reviewed  BASIC METABOLIC PANEL - Abnormal; Notable for the following components:      Result Value   Glucose, Bld 144 (*)    Calcium 8.5 (*)    All other components within normal limits  CBC WITH DIFFERENTIAL/PLATELET - Abnormal; Notable for the following components:   WBC 10.9 (*)    Platelets 100 (*)    Neutro Abs 10.2 (*)    Lymphs Abs 0.3 (*)    Abs Immature Granulocytes 0.19 (*)    All other components within normal limits  RAPID URINE DRUG SCREEN, HOSP PERFORMED - Abnormal; Notable for the following components:   Benzodiazepines POSITIVE (*)    All other  components within normal limits  URINALYSIS, ROUTINE W REFLEX MICROSCOPIC - Abnormal; Notable for the following components:   Color, Urine STRAW (*)    All other components within normal limits  CBG MONITORING, ED - Abnormal; Notable for the following components:   Glucose-Capillary 149 (*)    All other components within normal limits  SARS CORONAVIRUS 2 BY RT PCR (HOSPITAL ORDER, [facility])  MAGNESIUM  PHOSPHORUS  ETHANOL  LEVETIRACETAM LEVEL    EKG None  Radiology CT HEAD WO CONTRAST  Result Date: 09/28/2019 CLINICAL DATA:  Initial evaluation for acute seizure. EXAM: CT HEAD WITHOUT CONTRAST TECHNIQUE: Contiguous axial images were obtained from the base of the skull through the vertex without intravenous contrast. COMPARISON:  Brain MRI from  09/20/2019. FINDINGS: Brain: Postoperative changes from prior left parieto-occipital craniotomy. Underlying parenchymal changes related to patient's known GBM again seen within the underlying left parieto-occipital region. Degree of hypodensity within this region is not significantly changed as compared to recent MRI. Extension into the splenium and across the midline again noted. Irregular calcifications noted about the periphery of this lesion posteriorly, likely post treatment effect. No significant regional mass effect or midline shift. Overall, appearance is relatively stable from recent MRI. No evidence for interval hemorrhage or other complication. No other acute intracranial hemorrhage or large vessel territory infarct. No hydrocephalus or extra-axial fluid collection. Chronic bifrontal encephalomalacia again noted, likely related to remote trauma. Vascular: No hyperdense vessel. Scattered vascular calcifications noted within the carotid siphons. Skull: Prior left posterior craniotomy. Scalp soft tissues demonstrate no acute finding. Sinuses/Orbits: Globes and orbital soft tissues within normal limits. Paranasal sinuses  and mastoid air cells are clear. Other: None. IMPRESSION: 1. No significant interval change in size and appearance of patient's known GBM centered at the left occipital lobe with associated post treatment changes. No significant regional mass effect or midline shift. No interval hemorrhage or other complication. 2. No other acute intracranial abnormality. 3. 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 Electronically Signed   By: Jeannine Boga M.D.   On: 09/28/2019 19:33    Procedures Procedures (including critical care time)  Medications Ordered in ED Medications  sodium chloride 0.9 % bolus 1,000 mL (0 mLs Intravenous Stopped 09/28/19 1944)    And  0.9 %  sodium chloride infusion ( Intravenous Stopped 09/28/19 2310)  LORazepam (ATIVAN) injection 4 mg (2 mg Intravenous Given 09/28/19 2040)  levETIRAcetam (KEPPRA) IVPB 1000 mg/100 mL premix (0 mg Intravenous Stopped 09/28/19 2310)  dexamethasone (DECADRON) injection 10 mg (10 mg Intravenous Given 09/28/19 2214)    ED Course  I have reviewed the triage vital signs and the nursing notes.  Pertinent labs & imaging results that were available during my care of the patient were reviewed by me and considered in my medical decision making (see chart for details).    MDM Rules/Calculators/A&P                          BP (!) 159/95   Pulse 92   Temp 98 F (36.7 C) (Oral)   Resp 16   Ht 5\' 10"  (1.778 m)   Wt 84.8 kg   SpO2 97%   BMI 26.83 kg/m   Final Clinical Impression(s) / ED Diagnoses Final diagnoses:  Seizure ([place])    Rx / DC Orders ED Discharge Orders         Ordered    dexamethasone (DECADRON) 4 MG tablet  2 times daily        09/28/19 2307    levETIRAcetam (KEPPRA) 500 MG tablet        09/28/19 2307         6:41 PM I was able to get additional history through wife who is now at bedside.  Patient with known history of glioblastoma status post craniotomy in 2020 who has not had any seizure  activities for at least a year presenting today with witnessed seizure activities involving the right side of his arms and legs and face.  Wife also report brain tumor has recurred and actively growing.  He is on oral chemo agent.  He has had recurrent headaches improved with steroids and pain medication.  At baseline he has difficulty communicating  due to his speech speech impairment.  He has been compliant with his antiepileptic medication, Keppra.  No recent sickness.  His oncologist is Dr. Mickeal Skinner.  Work-up initiated, will obtain head CT scan and check labs.  Patient given Ativan as needed for seizure activities.  Will check Keppra level.  9:49 PM Appreciate consultation from on call neurologist Dr. Rory Percy who recommend loading pt with Keppra 1,000mg  here, and to also give decadron 10mg  IV.  Pt to go home with increase Keppra to 1,500mg  BID, and decadron 4mg  BID.  Pt to f/u with oncologist Dr. Mickeal Skinner.  Suspect seizure 2/2 to glioblastoma.    11:12 PM Patient had received Decadron as well as Keppra.  At this time he appears to be more alert.  Wife is at bedside.  Both are comfortable going home.  Will follow up with Dr. Mickeal Skinner outpatient.  Return precaution discussed.  Cody [name] was evaluated in Emergency Department on 09/28/2019 for the symptoms described in the history of present illness. He was evaluated in the context of the global COVID-19 pandemic, which necessitated consideration that the patient might be at risk for infection with the SARS-CoV-2 virus that causes COVID-19. Institutional protocols and algorithms that pertain to the evaluation of patients at risk for COVID-19 are in a state of rapid change based on information released by regulatory bodies including the CDC and federal and state organizations. These policies and algorithms were followed during the patient's care in the ED.    Domenic Moras, PA-C 09/28/19 2312    Lacretia Leigh, MD 10/02/19 [phone]

## 2019-09-28 NOTE — Discharge Instructions (Signed)
Please increase decadron to 4mg  twice daily, as well as keppra 1,500mg  twice daily for better control of seizure.  Call and follow up closely with Dr. Mickeal Skinner for further care.

## 2019-09-28 NOTE — Plan of Care (Signed)
Called by Domenic Moras, PA-C ED provider regarding this patient. Multiple seizure-like episodes on the right side. Has had twitching with increased frequency. Given 2 mg IV Ativan.  Has a left-sided GBM with progression. Currently on Keppra 1000 twice daily and Decadron 4 mg daily. Has refused salvage chemotherapy. Patient of Dr. Mickeal Skinner.  Recommendations: -Decadron 10 mg IV x1 now and increase daily Decadron dose to 4 mg twice daily p.o. -Increase Keppra to 1500 mg twice daily.  Loaded with additional Keppra 1000 mg IV now.  -If back to baseline, do not see a need to admit as we know why the seizures and where the seizures are coming from.  Please call back with questions. Follow-up with Dr. Shelly Bombard as an outpatient.  -- Amie Portland, MD [facility] Pager: [phone] If 7pm to 7am, please call on call as listed on [facility].

## 2019-09-28 NOTE — ED Triage Notes (Signed)
Pt BIBA from home.   Per EMS- Pt family called EMS due to witnessed seizure like activity lasting appx 37min-1hr.  Seizure activity described as "right arm, right leg, right side of face twitching, jerking, intermittently tensed.    EMS gave 2.5 mg IV versed 1716, which slowed frequency of movements.   Family reports pt was at baseline this morning, "up walking around." Found with seizure like activity around 1650.

## 2019-10-02 ENCOUNTER — Telehealth: Payer: Self-pay | Admitting: *Deleted

## 2019-10-02 ENCOUNTER — Other Ambulatory Visit: Payer: Self-pay | Admitting: Internal Medicine

## 2019-10-02 MED ORDER — OXYCODONE HCL 5 MG PO TABS
5.0000 mg | ORAL_TABLET | ORAL | 0 refills | Status: AC | PRN
Start: 2019-10-02 — End: ?

## 2019-10-02 NOTE — Telephone Encounter (Signed)
Patients wife called.  Since recent ED visit for seizure patient is unsafe with ambulation to leave at home alone and she wanted recommendations on help. Explained services most people use plus community resources and/or use of friends who she could get to help watch for long hours while she worked.  Offered [place] as a [employer] manager for any FMLA paperwork she might need to have completed if she ends up having to stay at home with him for safety reasons.  She also reported that patient since ED visit is on Decadron 4 mg BID and increased Keppra dose of 1500 mg BID.  He is having worsening of headahces this past weekend for about 4 days.  She is alternating between Tylenol and Percocet and wanted to know if that was safe.  Explained that both have tylenol in them and daily dosage concerns for that.   Routed to MD to see if any other alternative could be provided for relief with Headaches.  Pending response.

## 2019-10-02 NOTE — Telephone Encounter (Signed)
Dr. Mickeal Skinner will see patient this Thursday in office.  Until then in regards to Headaches he will order Oxycodone only (short term) until he is seen to avoid overuse of Acetaminophen.

## 2019-10-05 ENCOUNTER — Inpatient Hospital Stay: Payer: BC Managed Care – PPO

## 2019-10-05 ENCOUNTER — Other Ambulatory Visit: Payer: Self-pay

## 2019-10-05 ENCOUNTER — Inpatient Hospital Stay: Payer: BC Managed Care – PPO | Attending: Internal Medicine | Admitting: Internal Medicine

## 2019-10-05 VITALS — BP 146/109 | HR 96 | Temp 97.5°F | Resp 20 | Ht 70.0 in | Wt 182.8 lb

## 2019-10-05 DIAGNOSIS — C714 Malignant neoplasm of occipital lobe: Secondary | ICD-10-CM | POA: Diagnosis present

## 2019-10-05 DIAGNOSIS — R569 Unspecified convulsions: Secondary | ICD-10-CM | POA: Insufficient documentation

## 2019-10-05 DIAGNOSIS — R5383 Other fatigue: Secondary | ICD-10-CM | POA: Insufficient documentation

## 2019-10-05 DIAGNOSIS — C719 Malignant neoplasm of brain, unspecified: Secondary | ICD-10-CM

## 2019-10-05 DIAGNOSIS — Z7189 Other specified counseling: Secondary | ICD-10-CM | POA: Diagnosis not present

## 2019-10-05 LAB — CBC WITH DIFFERENTIAL (CANCER CENTER ONLY)
Abs Immature Granulocytes: 0.32 10*3/uL — ABNORMAL HIGH (ref 0.00–0.07)
Basophils Absolute: 0 10*3/uL (ref 0.0–0.1)
Basophils Relative: 0 %
Eosinophils Absolute: 0 10*3/uL (ref 0.0–0.5)
Eosinophils Relative: 0 %
HCT: 46.8 % (ref 39.0–52.0)
Hemoglobin: 16.8 g/dL (ref 13.0–17.0)
Immature Granulocytes: 3 %
Lymphocytes Relative: 4 %
Lymphs Abs: 0.6 10*3/uL — ABNORMAL LOW (ref 0.7–4.0)
MCH: 31.6 pg (ref 26.0–34.0)
MCHC: 35.9 g/dL (ref 30.0–36.0)
MCV: 88 fL (ref 80.0–100.0)
Monocytes Absolute: 0.6 10*3/uL (ref 0.1–1.0)
Monocytes Relative: 4 %
Neutro Abs: 11.4 10*3/uL — ABNORMAL HIGH (ref 1.7–7.7)
Neutrophils Relative %: 89 %
Platelet Count: 139 10*3/uL — ABNORMAL LOW (ref 150–400)
RBC: 5.32 MIL/uL (ref 4.22–5.81)
RDW: 13 % (ref 11.5–15.5)
WBC Count: 12.8 10*3/uL — ABNORMAL HIGH (ref 4.0–10.5)
nRBC: 0 % (ref 0.0–0.2)

## 2019-10-05 LAB — CMP (CANCER CENTER ONLY)
ALT: 33 U/L (ref 0–44)
AST: 13 U/L — ABNORMAL LOW (ref 15–41)
Albumin: 3.7 g/dL (ref 3.5–5.0)
Alkaline Phosphatase: 83 U/L (ref 38–126)
Anion gap: 11 (ref 5–15)
BUN: 20 mg/dL (ref 6–20)
CO2: 23 mmol/L (ref 22–32)
Calcium: 9.5 mg/dL (ref 8.9–10.3)
Chloride: 107 mmol/L (ref 98–111)
Creatinine: 1.15 mg/dL (ref 0.61–1.24)
GFR, Est AFR Am: >60 mL/min (ref >60)
GFR, Estimated: >60 mL/min (ref >60)
Glucose, Bld: 157 mg/dL — ABNORMAL HIGH (ref 70–99)
Potassium: 4.1 mmol/L (ref 3.5–5.1)
Sodium: 141 mmol/L (ref 135–145)
Total Bilirubin: 0.8 mg/dL (ref 0.3–1.2)
Total Protein: 6.7 g/dL (ref 6.5–8.1)

## 2019-10-05 LAB — TOTAL PROTEIN, URINE DIPSTICK: Protein, ur: NEGATIVE mg/dL

## 2019-10-06 NOTE — Progress Notes (Signed)
[facility] [place], [place] [address] [phone]   Interval Evaluation  Date of Service: 10/06/19 Patient Name: Cody [name] Patient MRN: [id number] Patient DOB: Oct 24, 1960 Provider: Ventura Sellers, MD  Identifying Statement:  Cody [name] is a 59 y.o. male with left occipital glioblastoma   Oncologic History: Oncology History  Glioblastoma ([facility])  09/29/2018 Surgery   Craniotomy, resection with Dr. Kathyrn Sheriff   10/21/2018 - 10/31/2018 Chemotherapy   The patient had [No matching medication found in this treatment plan]  for chemotherapy treatment.    01/09/2019 - 05/18/2019 Chemotherapy   The patient had [No matching medication found in this treatment plan]  for chemotherapy treatment.    01/23/2019 -  Chemotherapy   The patient had bevacizumab-bvzr (ZIRABEV) 800 mg in sodium chloride 0.9 % 100 mL chemo infusion, 10 mg/kg = 800 mg, Intravenous,  Once, 16 of 18 cycles Administration: 800 mg (01/23/2019), 800 mg (02/09/2019), 900 mg (02/24/2019), 900 mg (03/10/2019), 900 mg (03/24/2019), 900 mg (04/07/2019), 900 mg (04/21/2019), 900 mg (05/05/2019), 900 mg (05/30/2019), 900 mg (06/26/2019), 900 mg (06/12/2019), 900 mg (07/24/2019), 900 mg (08/08/2019), 900 mg (08/24/2019), 900 mg (09/07/2019), 900 mg (09/21/2019)  for chemotherapy treatment.    05/30/2019 - 05/30/2019 Chemotherapy   The patient had dexamethasone (DECADRON) 4 MG tablet, 1 of 1 cycle, Start date: --, End date: -- lomustine (CEENU) 10 MG capsule, 10 mg (100 % of original dose 10 mg), Oral,  Once, 1 of 1 cycle, Start date: 05/30/2019, End date: 05/30/2019 Dose modification: 10 mg (original dose 10 mg, Cycle 1) lomustine (CEENU) 100 MG capsule, 100 mg (100 % of original dose 100 mg), Oral,  Once, 1 of 1 cycle, Start date: 05/30/2019, End date: 05/30/2019 Dose modification: 100 mg (original dose 100 mg, Cycle 1) lomustine (CEENU) 40 MG capsule, 80 mg (100 % of original dose 80  mg), Oral,  Once, 1 of 1 cycle, Start date: 05/30/2019, End date: 05/30/2019 Dose modification: 80 mg (original dose 80 mg, Cycle 1)  for chemotherapy treatment.      Biomarkers:  MGMT Unknown.  IDH 1/2 Wild type.  EGFR Unknown  TERT Unknown   Interval History:  Cody [name] presents today for planned avastin infusion.  Wife is at bedside today.  He did have an ED visit for prolonged left sided convulsive seizure.  Keppra was increased to 156m twice per day and decadron to 463mtwice per day; he was not admitted. Language and right sided weakness continue gradual decline  Fatigue is severe, he is awake minimally during the day. He expresses disinterest in pursuing further treatment due to poor quality of life.  H+P (10/18/18) Patient presented to medical attention in August 2020 with several weeks history of new onset headaches.  Progressive nature of symptoms led to an ED admission, where CNS imaging demonstrated enhancing mass in the left occipital lobe.  He underwent craniotomy and resection with Dr. NuKathyrn Sheriffn 09/05/51/29ithout complication.  He was discharged to home with small visual field impairment and otherwise functionally intact.  He denies headaches or any seizures since surgery, having completed decadron taper.    Medications: Current Outpatient Medications on File Prior to Visit  Medication Sig Dispense Refill   acetaminophen (TYLENOL) 500 MG tablet Take 500 mg by mouth every 6 (six) hours as needed for headache.      dexamethasone (DECADRON) 4 MG tablet Take 1 tablet (4 mg total) by mouth 2 (  two) times daily. 30 tablet 0   finasteride (PROSCAR) 5 MG tablet Take 5 mg by mouth daily.     HYDROcodone-acetaminophen (NORCO/VICODIN) 5-325 MG tablet Take 1 tablet by mouth every 6 (six) hours as needed for moderate pain. Headaches     levETIRAcetam (KEPPRA) 500 MG tablet TAKE 3 TABLETS(1500 MG) BY MOUTH TWICE DAILY 90 tablet 0   methylphenidate (RITALIN) 5 MG tablet Take 1  tablet (5 mg total) by mouth 2 (two) times daily. 60 tablet 0   oxyCODONE (OXY IR/ROXICODONE) 5 MG immediate release tablet Take 1 tablet (5 mg total) by mouth every 4 (four) hours as needed for severe pain. 30 tablet 0   tamsulosin (FLOMAX) 0.4 MG CAPS capsule Take 0.4 mg by mouth daily.     No current facility-administered medications on file prior to visit.    Allergies: No Known Allergies Past Medical History:  Past Medical History:  Diagnosis Date   Enlarged prostate    Migraines    Past Surgical History:  Past Surgical History:  Procedure Laterality Date   APPLICATION OF CRANIAL NAVIGATION Left 09/29/2018   Procedure: APPLICATION OF CRANIAL NAVIGATION;  Surgeon: [name], MD;  Location: [facility];  Service: Neurosurgery;  Laterality: Left;   CRANIOTOMY Left 09/29/2018   Procedure: Sterotactic Left craniotomy for resection of tumor;  Surgeon: [name], MD;  Location: [facility];  Service: Neurosurgery;  Laterality: Left;  Sterotactic Left craniotomy for resection of tumor   Social History:  Social History   Socioeconomic History   Marital status: Married    Spouse name: Not on file   Number of children: Not on file   Years of education: Not on file   Highest education level: Not on file  Occupational History   Not on file  Tobacco Use   Smoking status: Never Smoker   Smokeless tobacco: Never Used  Vaping Use   Vaping Use: Never used  Substance and Sexual Activity   Alcohol use: Never   Drug use: Never   Sexual activity: Not on file  Other Topics Concern   Not on file  Social History Narrative   Not on file   Social Determinants of Health   Financial Resource Strain:    Difficulty of Paying Living Expenses: Not on file  Food Insecurity:    Worried About [place] in the Last Year: Not on file   Ran Out of Food in the Last Year: Not on file  Transportation Needs: Unmet Transportation Needs   Lack of Transportation  (Medical): Yes   Lack of Transportation (Non-Medical): No  Physical Activity:    Days of Exercise per Week: Not on file   Minutes of Exercise per Session: Not on file  Stress:    Feeling of Stress : Not on file  Social Connections:    Frequency of Communication with Friends and Family: Not on file   Frequency of Social Gatherings with Friends and Family: Not on file   Attends Religious Services: Not on file   Active Member of Clubs or Organizations: Not on file   Attends Archivist Meetings: Not on file   Marital Status: Not on file  Intimate Partner Violence: Not At Risk   Fear of Current or Ex-Partner: No   Emotionally Abused: No   Physically Abused: No   Sexually Abused: No   Family History: No family history on file.  Review of Systems: Constitutional: Denies fevers, chills or abnormal weight loss Eyes: Denies blurriness  of vision Ears, nose, mouth, throat, and face: Denies mucositis or sore throat Respiratory: Denies cough, dyspnea or wheezes Cardiovascular: Denies palpitation, chest discomfort or lower extremity swelling Gastrointestinal:  Denies nausea, constipation, diarrhea GU: Denies dysuria or incontinence Skin: Denies abnormal skin rashes Neurological: Per HPI Musculoskeletal: Denies joint pain, back or neck discomfort. No decrease in ROM Behavioral/Psych: Denies anxiety, disturbance in thought content, and mood instability  Physical Exam: Vitals:   10/05/19 1116  BP: (!) 146/109  Pulse: 96  Resp: 20  Temp: (!) 97.5 F (36.4 C)  SpO2: 100%   KPS: 80. General: Alert, cooperative, pleasant, in no acute distress Head: Craniotomy scar noted, dry and intact. EENT: No conjunctival injection or scleral icterus. Oral mucosa moist Lungs: Resp effort normal Cardiac: Regular rate and rhythm Abdomen: Soft, non-distended abdomen Skin: No rashes cyanosis or petechiae. Extremities: No clubbing or edema  Neurologic Exam: Mental Status:  Awake, alert, attentive to examiner. Oriented to self and environment. Moderate mixed dysphasia motor>sensory. Cranial Nerves: Visual acuity is grossly normal. Right homonymous hemianopia. Extra-ocular movements intact. No ptosis. Face is symmetric, tongue midline. Motor: Tone and bulk are normal. Power is full in both arms and legs. Reflexes are symmetric, no pathologic reflexes present. Intact finger to nose bilaterally Sensory: Intact to light touch and temperature Gait: Wide based, dystaxic  Labs: I have reviewed the data as listed    Component Value Date/Time   NA 141 10/05/2019 1045   K 4.1 10/05/2019 1045   CL 107 10/05/2019 1045   CO2 23 10/05/2019 1045   GLUCOSE 157 (H) 10/05/2019 1045   BUN 20 10/05/2019 1045   CREATININE 1.15 10/05/2019 1045   CALCIUM 9.5 10/05/2019 1045   PROT 6.7 10/05/2019 1045   ALBUMIN 3.7 10/05/2019 1045   AST 13 (L) 10/05/2019 1045   ALT 33 10/05/2019 1045   ALKPHOS 83 10/05/2019 1045   BILITOT 0.8 10/05/2019 1045   GFRNONAA >60 10/05/2019 1045   GFRAA >60 10/05/2019 1045   Lab Results  Component Value Date   WBC 12.8 (H) 10/05/2019   NEUTROABS 11.4 (H) 10/05/2019   HGB 16.8 10/05/2019   HCT 46.8 10/05/2019   MCV 88.0 10/05/2019   PLT 139 (L) 10/05/2019    Assessment/Plan Glioblastoma (HCC)  Focal seizures (HCC)  Goals of care, counseling/discussion  Mr. Statz is clinically progressive today.  Overall quality of life is poor due to fatigue, motor and language dyfunction, Avastin has not improved this situation recently.   After extensive goals of care discussion today, he has elected to pursue home hospice services.   In the meantime may continue Keppra 1500 BID and Decadron 52m BID in addition to PRN oxycodone for severe headaches.  We appreciate the opportunity to participate in the care of Cody [name]    We will remain as primary physician during and through hospice transition.  We are happy to see him at any time  with questions or clinical concerns.   All questions were answered. The patient knows to call the clinic with any problems, questions or concerns. No barriers to learning were detected.  I have spent a total of 30 minutes of face-to-face and non-face-to-face time, excluding clinical staff time, preparing to see patient, ordering tests and/or medications, counseling the patient, and independently interpreting results and communicating results to the patient/family/caregiver   ZVentura Sellers MD Medical Director of Neuro-Oncology [facility]09/03/21 8:56 AM

## 2019-10-10 ENCOUNTER — Telehealth: Payer: Self-pay | Admitting: *Deleted

## 2019-10-10 NOTE — Telephone Encounter (Signed)
Scanned letter to wife Cody [name] for leave of absence from work.   [email or web address]

## 2019-10-17 ENCOUNTER — Telehealth: Payer: Self-pay

## 2019-10-17 NOTE — Telephone Encounter (Signed)
Received TC from pts wife stating that the patient needed some paperwork filled out by Dr Mickeal Skinner. Gave her our fax [phone] so that papers could be faxed over. (Waiting for fax now). Wife also stated that once the paper work was filled out she would like a phone call so that she can stop by [facility] and pick up papers. Her # [phone]

## 2019-11-13 ENCOUNTER — Telehealth: Payer: Self-pay | Admitting: *Deleted

## 2019-11-13 NOTE — Telephone Encounter (Signed)
Completed paperwork for [employer].  Faxed completed copy to [phone] and mailed copy to spouse.

## 2019-11-14 ENCOUNTER — Inpatient Hospital Stay (HOSPITAL_COMMUNITY)
Admission: EM | Admit: 2019-11-14 | Discharge: 2019-11-15 | DRG: 951 | Disposition: A | Discharge status: Hospice | Payer: BC Managed Care – PPO | Attending: Internal Medicine | Admitting: Internal Medicine

## 2019-11-14 ENCOUNTER — Other Ambulatory Visit: Payer: Self-pay

## 2019-11-14 ENCOUNTER — Emergency Department (HOSPITAL_COMMUNITY): Payer: BC Managed Care – PPO

## 2019-11-14 ENCOUNTER — Encounter (HOSPITAL_COMMUNITY): Payer: Self-pay | Admitting: Emergency Medicine

## 2019-11-14 DIAGNOSIS — R627 Adult failure to thrive: Secondary | ICD-10-CM | POA: Diagnosis not present

## 2019-11-14 DIAGNOSIS — G40401 Other generalized epilepsy and epileptic syndromes, not intractable, with status epilepticus: Secondary | ICD-10-CM | POA: Diagnosis present

## 2019-11-14 DIAGNOSIS — R569 Unspecified convulsions: Secondary | ICD-10-CM | POA: Diagnosis not present

## 2019-11-14 DIAGNOSIS — C714 Malignant neoplasm of occipital lobe: Secondary | ICD-10-CM | POA: Diagnosis present

## 2019-11-14 DIAGNOSIS — N400 Enlarged prostate without lower urinary tract symptoms: Secondary | ICD-10-CM | POA: Diagnosis present

## 2019-11-14 DIAGNOSIS — W19XXXA Unspecified fall, initial encounter: Secondary | ICD-10-CM | POA: Diagnosis present

## 2019-11-14 DIAGNOSIS — U071 COVID-19: Secondary | ICD-10-CM | POA: Diagnosis present

## 2019-11-14 DIAGNOSIS — Z79899 Other long term (current) drug therapy: Secondary | ICD-10-CM

## 2019-11-14 DIAGNOSIS — R531 Weakness: Secondary | ICD-10-CM | POA: Diagnosis present

## 2019-11-14 DIAGNOSIS — C719 Malignant neoplasm of brain, unspecified: Secondary | ICD-10-CM

## 2019-11-14 DIAGNOSIS — G936 Cerebral edema: Secondary | ICD-10-CM | POA: Diagnosis present

## 2019-11-14 DIAGNOSIS — G40901 Epilepsy, unspecified, not intractable, with status epilepticus: Secondary | ICD-10-CM

## 2019-11-14 DIAGNOSIS — Z515 Encounter for palliative care: Secondary | ICD-10-CM | POA: Diagnosis not present

## 2019-11-14 DIAGNOSIS — Z66 Do not resuscitate: Secondary | ICD-10-CM | POA: Diagnosis present

## 2019-11-14 HISTORY — DX: Malignant (primary) neoplasm, unspecified: C80.1

## 2019-11-14 LAB — RESPIRATORY PANEL BY RT PCR (FLU A&B, COVID)
Influenza A by PCR: NEGATIVE
Influenza B by PCR: NEGATIVE
SARS Coronavirus 2 by RT PCR: POSITIVE — AB

## 2019-11-14 IMAGING — CT CT HEAD W/O CM
3 series · 15 of 47 positions shown, 18 images · non-contrast
Comparison: Head CT [DATE] and MRI [DATE]

CLINICAL DATA: Seizure activity. History of glioblastoma status
post resection and radiation.

EXAM:
CT HEAD WITHOUT CONTRAST
TECHNIQUE: Contiguous axial images were obtained from the base of the skull
through the vertex without intravenous contrast.

[Series 2: head wo · axial · 0.47mm/px · z∈[-19,+106]mm · 9 of 30 slices shown, 12 images]
[im 3/30  brain]
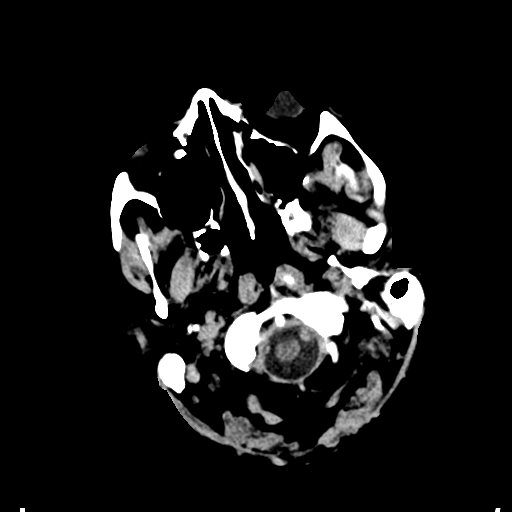
[im 3/30  bone]
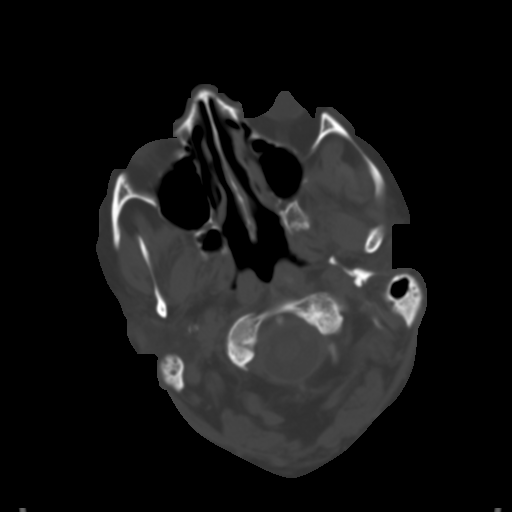
[im 6/30  brain]
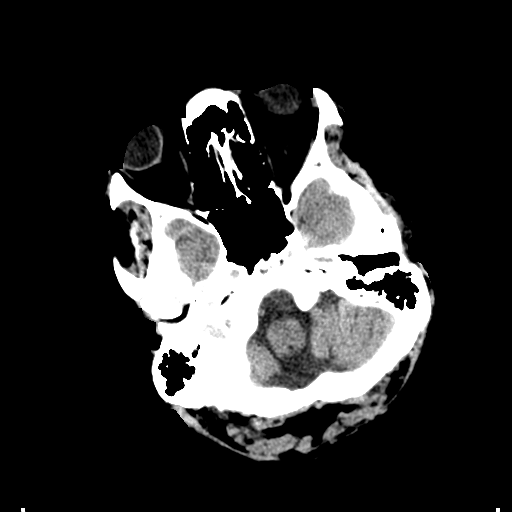
[im 9/30  brain]
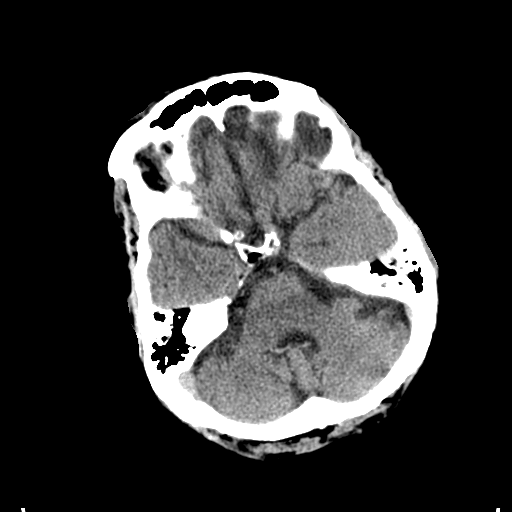
[im 12/30  brain]
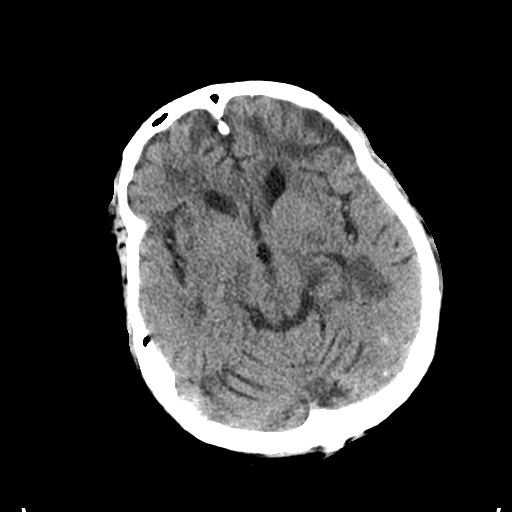
[im 16/30  brain]
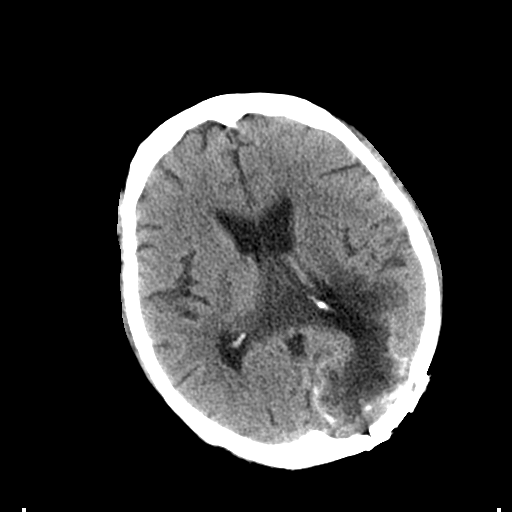
[im 16/30  bone]
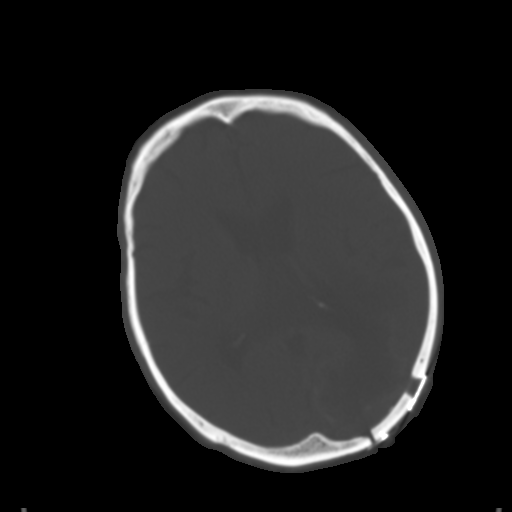
[im 19/30  brain]
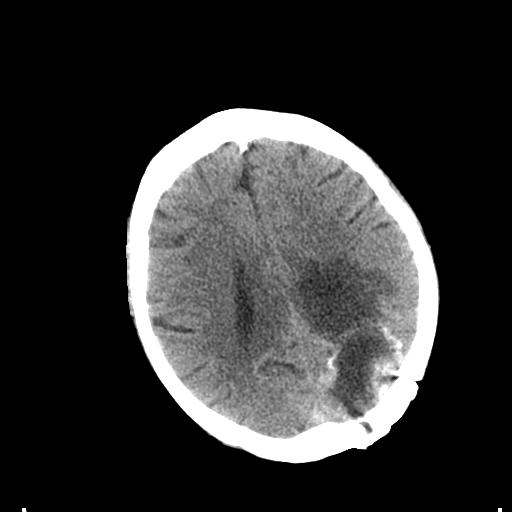
[im 22/30  brain]
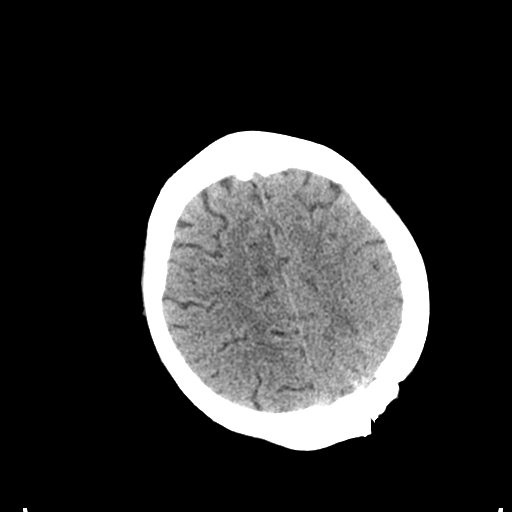
[im 25/30  brain]
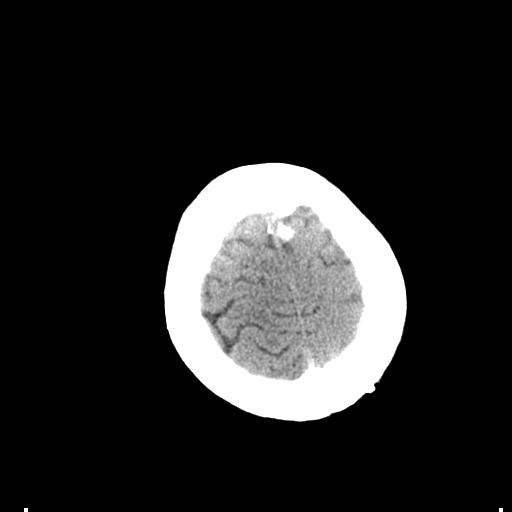
[im 28/30  brain]
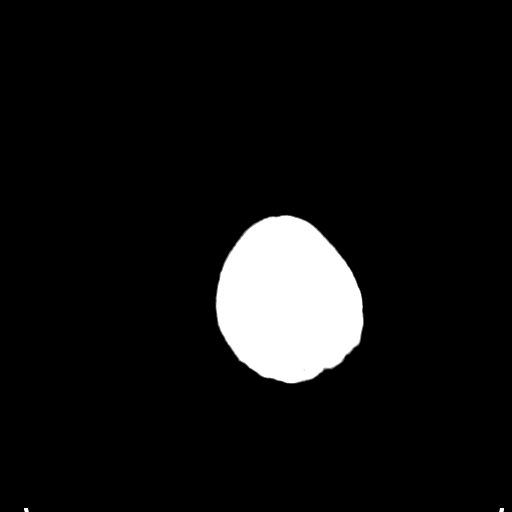
[im 28/30  bone]
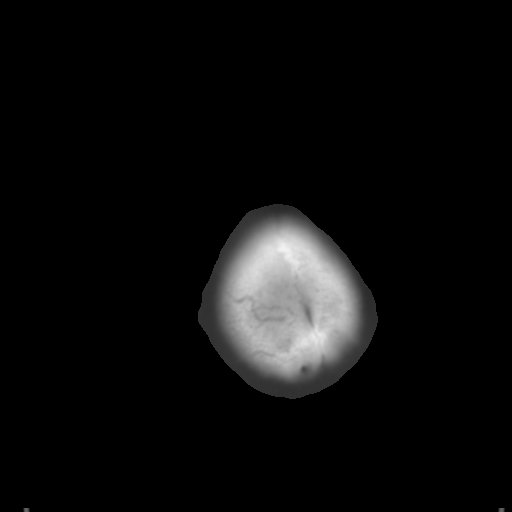

[Series 4: coronal soft tissue · coronal · 0.29mm/px · 3 of 65 slices shown]
[im 22/65  brain]
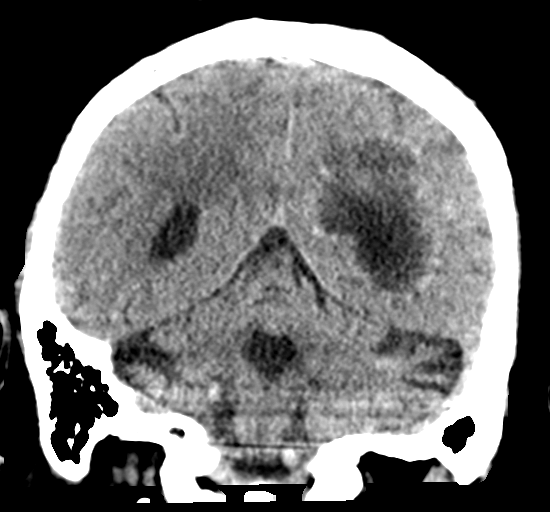
[im 29/65  brain]
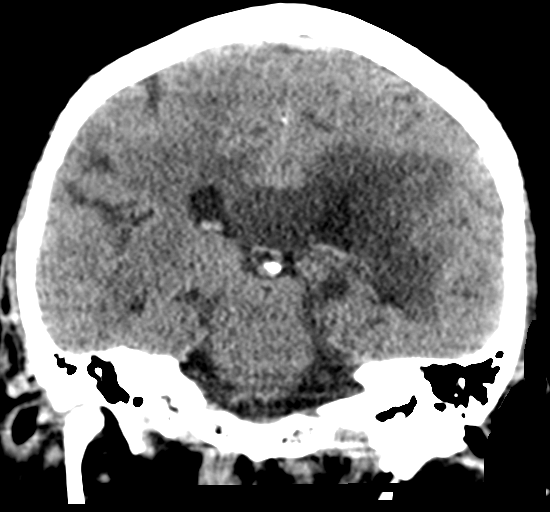
[im 36/65  brain]
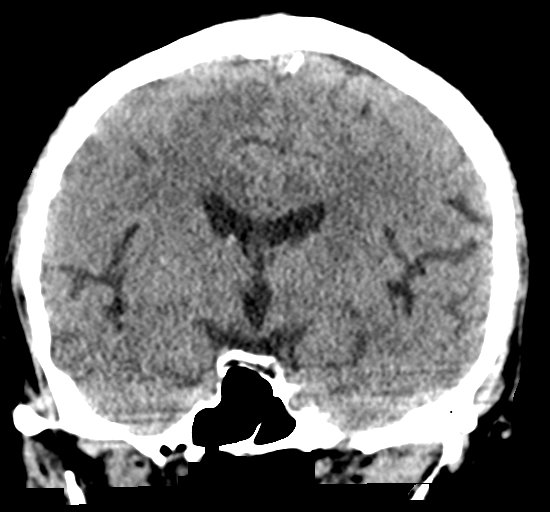

[Series 5: sagittal soft tissue · sagittal · 0.29mm/px · 3 of 50 slices shown]
[im 17/50  brain]
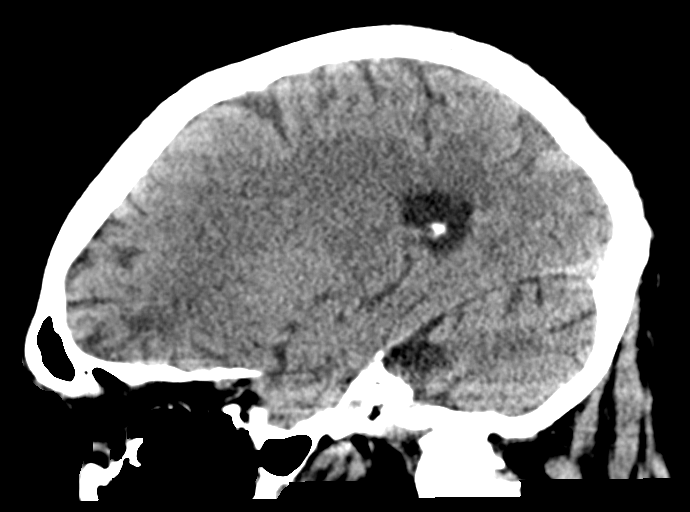
[im 25/50  brain]
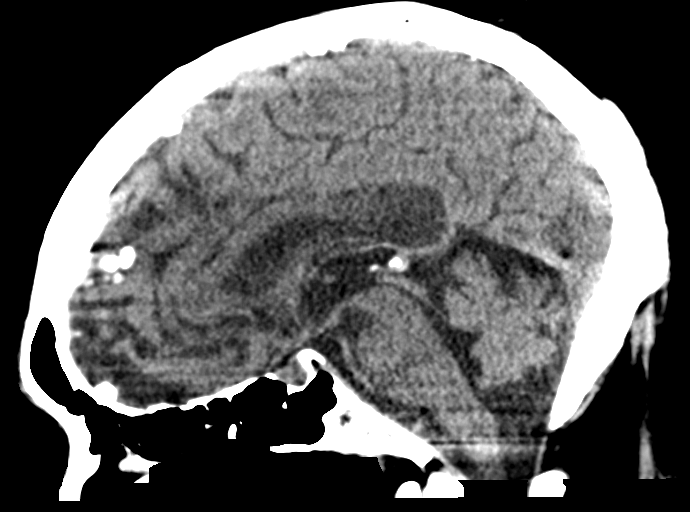
[im 33/50  brain]
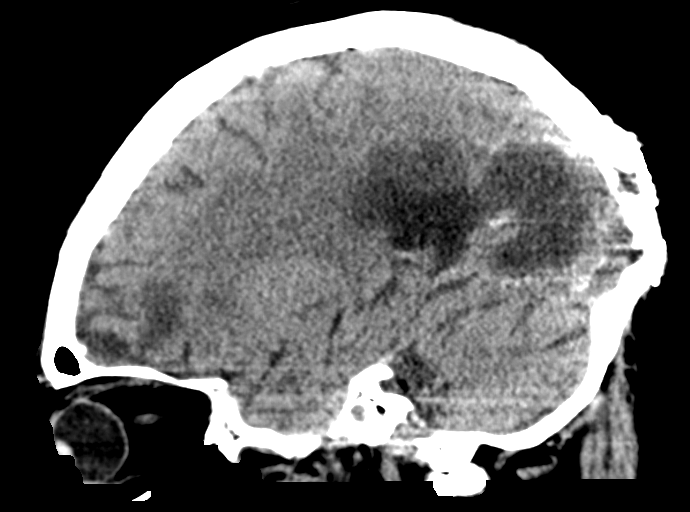

[15 of 47 positions shown; findings below may reference images not displayed]

FINDINGS: Brain: Sequelae of left parieto-occipital craniotomy are again
identified. A hypoattenuating mass is again seen in the left
occipital lobe with peripheral calcification. Masslike
hypoattenuation extending into the splenium of the corpus callosum
and across the midline to the right has progressed from the prior
CT, and hypoattenuation extending anteriorly in the left parietal
lobe may have also mildly progressed. There is no midline shift or
other significant mass effect. No acute cortically based infarct,
intracranial hemorrhage, or extra-axial fluid collection is
identified. Encephalomalacia is again noted anteriorly in both
frontal lobes. The ventricles are unchanged in size.

Vascular: No hyperdense vessel.

Skull: No acute fracture or suspicious osseous lesion.

Sinuses/Orbits: Visualized paranasal sinuses and mastoid air cells
are clear. Unremarkable orbits.

Other: None.
IMPRESSION: 1. Left occipital glioblastoma with increased masslike
hypoattenuation in the splenium of the corpus callosum suggesting
progressive tumor.
2. No evidence of acute intracranial abnormality.

## 2019-11-14 MED ORDER — DEXAMETHASONE SODIUM PHOSPHATE 10 MG/ML IJ SOLN
10.0000 mg | Freq: Once | INTRAMUSCULAR | Status: AC
  Administered 2019-11-14: 10 mg via INTRAVENOUS
  Filled 2019-11-14: qty 1

## 2019-11-14 MED ORDER — SODIUM CHLORIDE 0.9 % IV SOLN
75.0000 mL/h | INTRAVENOUS | Status: DC
  Administered 2019-11-14 – 2019-11-15 (×3): 75 mL/h via INTRAVENOUS

## 2019-11-14 MED ORDER — SODIUM CHLORIDE 0.9 % IV BOLUS (SEPSIS)
1000.0000 mL | Freq: Once | INTRAVENOUS | Status: AC
  Administered 2019-11-14: 1000 mL via INTRAVENOUS

## 2019-11-14 MED ORDER — LORAZEPAM 2 MG/ML IJ SOLN: 2.0000 mg | INTRAMUSCULAR | Status: AC

## 2019-11-14 MED ORDER — DEXAMETHASONE SODIUM PHOSPHATE 4 MG/ML IJ SOLN
4.0000 mg | Freq: Three times a day (TID) | INTRAMUSCULAR | Status: DC
  Administered 2019-11-14 – 2019-11-15 (×4): 4 mg via INTRAVENOUS
  Filled 2019-11-14 (×4): qty 1

## 2019-11-14 MED ORDER — LEVETIRACETAM IN NACL 1000 MG/100ML IV SOLN
1000.0000 mg | Freq: Once | INTRAVENOUS | Status: AC
  Administered 2019-11-14 (×2): 1000 mg via INTRAVENOUS
  Filled 2019-11-14: qty 100

## 2019-11-14 MED ORDER — SODIUM CHLORIDE 0.9 % IV SOLN
1500.0000 mg | Freq: Once | INTRAVENOUS | Status: AC
  Administered 2019-11-14: 1500 mg via INTRAVENOUS
  Filled 2019-11-14: qty 30

## 2019-11-14 MED ORDER — MORPHINE SULFATE (PF) 2 MG/ML IV SOLN
2.0000 mg | INTRAVENOUS | Status: DC | PRN
  Administered 2019-11-14 – 2019-11-15 (×2): 2 mg via INTRAVENOUS
  Filled 2019-11-14 (×2): qty 1

## 2019-11-14 MED ORDER — LORAZEPAM 2 MG/ML IJ SOLN
INTRAMUSCULAR | Status: AC
  Administered 2019-11-14: 2 mg
  Filled 2019-11-14: qty 1

## 2019-11-14 MED ORDER — LORAZEPAM 2 MG/ML IJ SOLN: 1.0000 mg | INTRAMUSCULAR | Status: DC | PRN

## 2019-11-14 MED ORDER — LEVETIRACETAM IN NACL 1500 MG/100ML IV SOLN
1500.0000 mg | Freq: Two times a day (BID) | INTRAVENOUS | Status: DC
  Administered 2019-11-14 – 2019-11-15 (×2): 1500 mg via INTRAVENOUS
  Filled 2019-11-14 (×3): qty 100

## 2019-11-14 MED ORDER — ONDANSETRON HCL 4 MG/2ML IJ SOLN: 4.0000 mg | Freq: Four times a day (QID) | INTRAMUSCULAR | Status: DC | PRN

## 2019-11-14 MED ORDER — SODIUM CHLORIDE 0.9 % IV SOLN
1000.0000 mL | INTRAVENOUS | Status: DC
  Administered 2019-11-14: 1000 mL via INTRAVENOUS

## 2019-11-14 MED ORDER — ONDANSETRON HCL 4 MG PO TABS: 4.0000 mg | ORAL_TABLET | Freq: Four times a day (QID) | ORAL | Status: DC | PRN

## 2019-11-14 NOTE — ED Notes (Signed)
Patient transported to CT 

## 2019-11-14 NOTE — ED Provider Notes (Signed)
[facility] DEPT Provider Note  CSN: [id number] Arrival date & time: 11/14/19 0406  Chief Complaint(s) Seizures  HPI Cody [name] is a 59 y.o. male with a history of glioblastoma currently under hospice care who presents to the emergency department with recurring seizures at home.  Patient is on [place] for seizures.  Wife reports that he had approximately 6 focal seizures and 3 grand mall seizures lasting approximately 2-1/2 minutes each.  This was in the last several hours.  The wife spoke with palliative care on call nurse.  Given the recurring grand mal seizures, wife chose to contact EMS.  In route patient had another grand mal seizure requiring 2.5 mg of Versed.  Remainder of history, ROS, and physical exam limited due to patient's condition (unresponsive). Additional information was obtained from EMS and wife.   Level V Caveat.  Wife requests comfort care for the patient, but would like a repeat CT head to see if there are any major changes.  HPI  Past Medical History Past Medical History:  Diagnosis Date  . Cancer ([facility])   . Enlarged prostate   . Migraines    Patient Active Problem List   Diagnosis Date Noted  . Focal seizures ([facility]) 01/09/2019  . Goals of care, counseling/discussion 01/09/2019  . BPH (benign prostatic hyperplasia) 10/29/2018  . Acute metabolic encephalopathy 40/97/3532  . Altered mental status   . Primary cancer of occipital lobe ([facility]) 10/21/2018  . Glioblastoma ([facility]) 09/27/2018   Home Medication(s) Prior to Admission medications   Medication Sig Start Date End Date Taking? Authorizing Provider  acetaminophen (TYLENOL) 500 MG tablet Take 500 mg by mouth every 6 (six) hours as needed for headache.     [provider]  dexamethasone (DECADRON) 4 MG tablet Take 1 tablet (4 mg total) by mouth 2 (two) times daily. 09/28/19   Domenic Moras, PA-C  finasteride (PROSCAR) 5 MG tablet Take 5 mg by mouth daily.     [provider]  HYDROcodone-acetaminophen (NORCO/VICODIN) 5-325 MG tablet Take 1 tablet by mouth every 6 (six) hours as needed for moderate pain. Headaches    [provider]  levETIRAcetam (KEPPRA) 500 MG tablet TAKE 3 TABLETS(1500 MG) BY MOUTH TWICE DAILY 09/28/19   Domenic Moras, PA-C  methylphenidate (RITALIN) 5 MG tablet Take 1 tablet (5 mg total) by mouth 2 (two) times daily. 09/22/19   Vaslow, Acey Lav, MD  oxyCODONE (OXY IR/ROXICODONE) 5 MG immediate release tablet Take 1 tablet (5 mg total) by mouth every 4 (four) hours as needed for severe pain. 10/02/19   Ventura Sellers, MD  tamsulosin (FLOMAX) 0.4 MG CAPS capsule Take 0.4 mg by mouth daily. 12/09/18   [provider]                                                                                                                                    Past Surgical History  Past Surgical History:  Procedure Laterality Date  . APPLICATION OF CRANIAL NAVIGATION Left 09/29/2018   Procedure: APPLICATION OF CRANIAL NAVIGATION;  Surgeon: [name], MD;  Location: [facility];  Service: Neurosurgery;  Laterality: Left;  . CRANIOTOMY Left 09/29/2018   Procedure: Sterotactic Left craniotomy for resection of tumor;  Surgeon: [name], MD;  Location: [facility];  Service: Neurosurgery;  Laterality: Left;  Sterotactic Left craniotomy for resection of tumor   Family History History reviewed. No pertinent family history.  Social History Social History   Tobacco Use  . Smoking status: Never Smoker  . Smokeless tobacco: Never Used  Vaping Use  . Vaping Use: Never used  Substance Use Topics  . Alcohol use: Never  . Drug use: Never   Allergies Patient has no known allergies.  Review of Systems Review of Systems  Unable to perform ROS: Patient unresponsive    Physical Exam Vital Signs  I have reviewed the triage vital signs BP (!) 142/100   Pulse 94   Temp 97.7 F (36.5 C) (Axillary)   Resp (!) 22   Ht  6' (1.829 m)   Wt 81.6 kg   SpO2 100%   BMI 24.41 kg/m   Physical Exam Vitals reviewed.  Constitutional:      General: He is not in acute distress.    Appearance: He is well-developed. He is not diaphoretic.     Interventions: Face mask in place.  HENT:     Head: Normocephalic and atraumatic.     Nose: Nose normal.  Eyes:     General: No scleral icterus.       Right eye: No discharge.        Left eye: No discharge.     Conjunctiva/sclera: Conjunctivae normal.     Pupils: Pupils are equal, round, and reactive to light.  Cardiovascular:     Rate and Rhythm: Normal rate and regular rhythm.     Heart sounds: No murmur heard.  No friction rub. No gallop.   Pulmonary:     Effort: Pulmonary effort is normal. No respiratory distress.     Breath sounds: Normal breath sounds. No stridor. No rales.  Abdominal:     General: There is no distension.     Palpations: Abdomen is soft.     Tenderness: There is no abdominal tenderness.  Musculoskeletal:        General: No tenderness.     Cervical back: Normal range of motion and neck supple.  Skin:    General: Skin is warm and dry.     Findings: No erythema or rash.  Neurological:     Mental Status: He is unresponsive.     GCS: GCS eye subscore is 1. GCS verbal subscore is 1. GCS motor subscore is 5.     ED Results and Treatments Labs (all labs ordered are listed, but only abnormal results are displayed) Labs Reviewed  RESPIRATORY PANEL BY RT PCR (FLU A&B, COVID)  EKG  EKG Interpretation  Date/Time:    Ventricular Rate:    PR Interval:    QRS Duration:   QT Interval:    QTC Calculation:   R Axis:     Text Interpretation:        Radiology No results found.  Pertinent labs & imaging results that were available during my care of the patient were reviewed by me and considered in my medical decision making  (see chart for details).  Medications Ordered in ED Medications  sodium chloride 0.9 % bolus 1,000 mL (1,000 mLs Intravenous New Bag/Given 11/14/19 0506)    Followed by  0.9 %  sodium chloride infusion (has no administration in time range)  levETIRAcetam (KEPPRA) IVPB 1000 mg/100 mL premix (1,000 mg Intravenous New Bag/Given 11/14/19 0504)  dexamethasone (DECADRON) injection 10 mg (10 mg Intravenous Given 11/14/19 0504)  LORazepam (ATIVAN) injection 2 mg ( Intravenous Canceled Entry 11/14/19 0505)                                                                                                                                    Procedures .1-3 Lead EKG Interpretation Performed by: Fatima Blank, MD Authorized by: Fatima Blank, MD     Interpretation: normal     ECG rate:  94   ECG rate assessment: normal     Rhythm: sinus rhythm     Ectopy: none     Conduction: normal   .Critical Care Performed by: Fatima Blank, MD Authorized by: Fatima Blank, MD    CRITICAL CARE Performed by: Grayce Sessions Kwynn Schlotter Total critical care time: 45 minutes Critical care time was exclusive of separately billable procedures and treating other patients. Critical care was necessary to treat or prevent imminent or life-threatening deterioration. Critical care was time spent personally by me on the following activities: development of treatment plan with patient and/or surrogate as well as nursing, discussions with consultants, evaluation of patient's response to treatment, examination of patient, obtaining history from patient or surrogate, ordering and performing treatments and interventions, ordering and review of laboratory studies, ordering and review of radiographic studies, pulse oximetry and re-evaluation of patient's condition.    (including critical care time)  Medical Decision Making / ED Course I have reviewed the nursing notes for this encounter and the  patient's prior records (if available in EHR or on provided paperwork).   Cody [name] was evaluated in Emergency Department on 11/14/2019 for the symptoms described in the history of present illness. He was evaluated in the context of the global COVID-19 pandemic, which necessitated consideration that the patient might be at risk for infection with the SARS-CoV-2 virus that causes COVID-19. Institutional protocols and algorithms that pertain to the evaluation of patients at risk for COVID-19 are in a state of rapid change based on information released by regulatory bodies including the CDC and federal and state organizations. These policies and  algorithms were followed during the patient's care in the ED.  Here for status epilepticus due to GBM. Patient's wife request no labs but is okay with CT of the head. Patient was given IV fluids and dose of Keppra was ordered.  Prior to the [place] patient had another grand mal seizure lasting approximately 2-1/2 minutes witnessed by nurse.  He was given 2 mg of Ativan.  Keppra was hung.  CT head appears to have increased vasogenic edema this patient was given Decadron.  Wife now at bedside.  Will consult neurology and hospitalist for admission.      Final Clinical Impression(s) / ED Diagnoses Final diagnoses:  GBM (glioblastoma multiforme) ([facility])  Status epilepticus ([facility])      This chart was dictated using voice recognition software.  Despite best efforts to proofread,  errors can occur which can change the documentation meaning.   Fatima Blank, MD 11/14/19 [phone]

## 2019-11-14 NOTE — Progress Notes (Signed)
Pharmacy Consult   58 y/o M with a h/o glioblastoma and seizures admitted with seizure. Patient takes Keppra 1500 mg bid and was loaded with 1500 mg fosphenytoin, Keppra 1000 mg iv, and ativan 2 mg iv in ED. Plan is to add vimpat after checking EKG. No interactions with current meds or with home meds. Renal function is at baseline. Will continue to follow remotely and make recommendations as needed. Thank you for the consult.   Ulice Dash, PharmD, BCPS [phone]

## 2019-11-14 NOTE — Progress Notes (Addendum)
[facility])  Cody [name] is a current hospice patient with [facility]. Patients CTI is Glioblastoma. Hospice will continue to follow this patient during hospital stay. Due to positive Covid result we will touch base by phone. Wife activated EMS due to uncontrolled Seizures. Per spouse he has had them before but unsure if related to having Covid. This is a related admission per [facility] MD.   Cody [name] with wife and exchanged report with hospital team. Patient will be admitted to [facility] for monitoring of Seizures. ACC will continue to follow up with patient and family until discharge.   VS:148/90, HR 59, RR 12, O2 100% non-rebreather-15L I&O:137.8/0? 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     - family reports no respiratory symptoms at home     - pt is comfort care measures, will hold treatment     - remain in isolation   Hospice/Comfort Care patient     - wife confirms that he will remain comfort care measures w/ goal to bring him home once he stabilizes   Discharge planning: Unsure at this time Family contact: Spoke to wife Cody [name] IDG: updated GOC: clear  Should  this patient need ambulance transfer at discharge-Please use [facility] as they contract this service for our active hospice patients.   Please feel free to call with any hospice related questions.  Cody [name], BSN, [facility] (in [place]) [phone]

## 2019-11-14 NOTE — ED Notes (Signed)
Pts spouse updated on plan of care and pts COVID positive result.

## 2019-11-14 NOTE — H&P (Addendum)
History and Physical    Cody [name] [id number] DOB: 13-Apr-1960 DOA: 11/14/2019  PCP: Maury Dus, MD  Patient coming from: Home  Chief Complaint: Seizures  HPI: Cody [name] is a 60 y.o. male with medical history significant of glioblastoma, seizures.    Hx from wife. Pt fell yesterday but was able to get up and move around. No head injury. Was normal for the majority of the day. Around 1830 hrs, the patient seemed to decline. He developed right side weakness and he couldn't feed himself. He attempted to get up to go to the restroom but couldn't stand to walk. By 2300 hrs, he started having seizures. Lasted 10 seconds at first, but then started prolonging. Had 6 seizures in a row. Pt's wife called hospice and was advised to call back if he worsened. He continued to have seizures and a grand mal seiziure was noted. She then called 911.    ED Course: Found to have seizures. He was given a dose of decadron, ativan, and a loading dose of fosphenytoin. TR[facility] was called for admission.  Review of Systems:  No fevers, N/V/D. No cough, dyspnea. No chest pain. Reports he had headache. Review of systems is otherwise negative for all not mentioned in HPI.   PMHx Past Medical History:  Diagnosis Date  . Cancer ([place])   . Enlarged prostate   . Migraines     PSHx Past Surgical History:  Procedure Laterality Date  . APPLICATION OF CRANIAL NAVIGATION Left 09/29/2018   Procedure: APPLICATION OF CRANIAL NAVIGATION;  Surgeon: [name], MD;  Location: [facility];  Service: Neurosurgery;  Laterality: Left;  . CRANIOTOMY Left 09/29/2018   Procedure: Sterotactic Left craniotomy for resection of tumor;  Surgeon: [name], MD;  Location: [facility];  Service: Neurosurgery;  Laterality: Left;  Sterotactic Left craniotomy for resection of tumor    SocHx  reports that he has never smoked. He has never used smokeless tobacco. He reports that he does not drink alcohol and does not use  drugs.  No Known Allergies  FamHx History reviewed. No pertinent family history.  Prior to Admission medications   Medication Sig Start Date End Date Taking? Authorizing Provider  acetaminophen (TYLENOL) 500 MG tablet Take 500 mg by mouth every 6 (six) hours as needed for headache.     [provider]  dexamethasone (DECADRON) 4 MG tablet Take 1 tablet (4 mg total) by mouth 2 (two) times daily. 09/28/19   Domenic Moras, PA-C  finasteride (PROSCAR) 5 MG tablet Take 5 mg by mouth daily.    [provider]  HYDROcodone-acetaminophen (NORCO/VICODIN) 5-325 MG tablet Take 1 tablet by mouth every 6 (six) hours as needed for moderate pain. Headaches    [provider]  levETIRAcetam (KEPPRA) 500 MG tablet TAKE 3 TABLETS(1500 MG) BY MOUTH TWICE DAILY 09/28/19   Domenic Moras, PA-C  methylphenidate (RITALIN) 5 MG tablet Take 1 tablet (5 mg total) by mouth 2 (two) times daily. 09/22/19   Vaslow, Acey Lav, MD  oxyCODONE (OXY IR/ROXICODONE) 5 MG immediate release tablet Take 1 tablet (5 mg total) by mouth every 4 (four) hours as needed for severe pain. 10/02/19   Ventura Sellers, MD  tamsulosin (FLOMAX) 0.4 MG CAPS capsule Take 0.4 mg by mouth daily. 12/09/18   [provider]    Physical Exam: Vitals:   11/14/19 0413 11/14/19 0425 11/14/19 0604 11/14/19 0710  BP:  (!) 142/100 (!) 141/101 (!) 124/105  Pulse:  94 67 81  Resp:  (!) 22 17 18   Temp:  97.7 F (36.5 C)    TempSrc:  Axillary    SpO2:  100% 100% 100%  Weight: 81.6 kg     Height: 6' (1.829 m)       General: 59 y.o. male resting in bed in NAD Eyes: PERRL, normal sclera ENMT: Nares patent w/o discharge, orophaynx clear, dentition normal, ears w/o discharge/lesions/ulcers Neck: Supple, trachea midline Cardiovascular: RRR, +S1, S2, no m/g/r, equal pulses throughout Respiratory: CTABL, no w/r/r, normal WOB GI: BS+, NDNT, no masses noted, no organomegaly noted MSK: No e/c/c Skin: No rashes, bruises,  ulcerations noted Neuro: somnolent  Labs on Admission: I have personally reviewed following labs and imaging studies  CBC: No results for input(s): WBC, NEUTROABS, HGB, HCT, MCV, PLT in the last 168 hours. Basic Metabolic Panel: No results for input(s): NA, K, CL, CO2, GLUCOSE, BUN, CREATININE, CALCIUM, MG, PHOS in the last 168 hours. GFR: CrCl cannot be calculated (Patient's most recent lab result is older than the maximum 21 days allowed.). Liver Function Tests: No results for input(s): AST, ALT, ALKPHOS, BILITOT, PROT, ALBUMIN in the last 168 hours. No results for input(s): LIPASE, AMYLASE in the last 168 hours. No results for input(s): AMMONIA in the last 168 hours. Coagulation Profile: No results for input(s): INR, PROTIME in the last 168 hours. Cardiac Enzymes: No results for input(s): CKTOTAL, CKMB, CKMBINDEX, TROPONINI in the last 168 hours. BNP (last 3 results) No results for input(s): PROBNP in the last 8760 hours. HbA1C: No results for input(s): HGBA1C in the last 72 hours. CBG: No results for input(s): GLUCAP in the last 168 hours. Lipid Profile: No results for input(s): CHOL, HDL, LDLCALC, TRIG, CHOLHDL, LDLDIRECT in the last 72 hours. Thyroid Function Tests: No results for input(s): TSH, T4TOTAL, FREET4, T3FREE, THYROIDAB in the last 72 hours. Anemia Panel: No results for input(s): VITAMINB12, FOLATE, FERRITIN, TIBC, IRON, RETICCTPCT in the last 72 hours. Urine analysis:    Component Value Date/Time   COLORURINE STRAW (A) 09/28/2019 2035   APPEARANCEUR CLEAR 09/28/2019 2035   LABSPEC 1.008 09/28/2019 2035   PHURINE 7.0 09/28/2019 2035   GLUCOSEU NEGATIVE 09/28/2019 2035   HGBUR NEGATIVE 09/28/2019 2035   [place] 09/28/2019 2035   [place] NEGATIVE 09/28/2019 2035   PROTEINUR NEGATIVE 10/05/2019 1109   NITRITE NEGATIVE 09/28/2019 2035   LEUKOCYTESUR NEGATIVE 09/28/2019 2035    Radiological Exams on Admission: CT Head Wo Contrast  Result  Date: 11/14/2019 CLINICAL DATA:  Seizure activity. History of glioblastoma status post resection and radiation. EXAM: CT HEAD WITHOUT CONTRAST TECHNIQUE: Contiguous axial images were obtained from the base of the skull through the vertex without intravenous contrast. COMPARISON:  Head CT 09/28/2019 and MRI 09/20/2019 FINDINGS: Brain: Sequelae of left parieto-occipital craniotomy are again identified. A hypoattenuating mass is again seen in the left occipital lobe with peripheral calcification. Masslike hypoattenuation extending into the splenium of the corpus callosum and across the midline to the right has progressed from the prior CT, and hypoattenuation extending anteriorly in the left parietal lobe may have also mildly progressed. There is no midline shift or other significant mass effect. No acute cortically based infarct, intracranial hemorrhage, or extra-axial fluid collection is identified. Encephalomalacia is again noted anteriorly in both frontal lobes. The ventricles are unchanged in size. Vascular: No hyperdense vessel. Skull: No acute fracture or suspicious osseous lesion. Sinuses/Orbits: Visualized paranasal sinuses and mastoid air cells are clear. Unremarkable orbits. Other: None. IMPRESSION: 1. Left occipital glioblastoma with  increased masslike hypoattenuation in the splenium of the corpus callosum suggesting progressive tumor. 2. No evidence of acute intracranial abnormality. Electronically Signed   By: Logan Bores M.D.   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     - family reports no respiratory symptoms at home     - pt is comfort care measures, will hold treatment     - remain in isolation   Hospice/Comfort Care patient     - wife confirms that he will remain comfort care measures w/ goal to bring him home once he stabilizes  DVT prophylaxis: SCDs  Code Status: DNR  Family Communication: Spoke with wife by phone  Consults called: Spoke with neurology  Admission status: Inpatient   Status is: Inpatient  Remains inpatient appropriate because:Inpatient level of care appropriate due to severity of illness   Dispo: The patient is from: Home              Anticipated d/c is to: Home              Anticipated d/c date is: 2 days              Patient currently is not medically stable to d/c.  75 minutes spent in the coordination of care today.    Jonnie Finner DO [facility]  If 7PM-7AM, please contact night-coverage [email or web address]  11/14/2019, 7:23 AM

## 2019-11-14 NOTE — ED Notes (Signed)
Marylyn Ishihara MD paged with pts COVID positive result

## 2019-11-14 NOTE — Progress Notes (Signed)
Patient arrived via stretcher from ED. Transferred to bed with three person assist. Only responding to painful stimuli at this time. Unable to grip hands. All VSS. O2 is 100% on 15liters NRB. Patient cleaned up, gown applied. Spoke with Hyman Bible, patient's wife, who is upset d/t not being able to be with patient. Notified Materials engineer in regards to wife upset. Will continue to monitor closely. Seizure pads applied to all bed rails.

## 2019-11-14 NOTE — ED Triage Notes (Signed)
Pt presents by [facility] for seizure activity due to brain cancer. EMS reports seizure activity since 2300. EMS administered 2.5mg  Versed IV for seizure control.

## 2019-11-14 NOTE — Progress Notes (Signed)
Report called to [facility]. Patient will be moved to 4west room 5993 for wife Solmon Ice to be with patient d/t comfort measures/hospice and this has been approved by administrators.

## 2019-11-15 ENCOUNTER — Telehealth: Payer: Self-pay | Admitting: *Deleted

## 2019-11-15 DIAGNOSIS — R627 Adult failure to thrive: Secondary | ICD-10-CM

## 2019-11-15 DIAGNOSIS — C719 Malignant neoplasm of brain, unspecified: Secondary | ICD-10-CM

## 2019-11-15 DIAGNOSIS — U071 COVID-19: Secondary | ICD-10-CM

## 2019-11-15 LAB — HIV ANTIBODY (ROUTINE TESTING W REFLEX): HIV Screen 4th Generation wRfx: NONREACTIVE

## 2019-11-15 MED ORDER — DEXAMETHASONE 4 MG PO TABS: 4.0000 mg | ORAL_TABLET | Freq: Three times a day (TID) | ORAL | 0 refills | Status: AC

## 2019-11-15 MED ORDER — LACOSAMIDE 50 MG PO TABS: 50.0000 mg | ORAL_TABLET | Freq: Two times a day (BID) | ORAL | 0 refills | Status: DC

## 2019-11-15 MED ORDER — LACOSAMIDE 50 MG PO TABS: 50.0000 mg | ORAL_TABLET | Freq: Two times a day (BID) | ORAL | 0 refills | Status: AC

## 2019-11-15 MED ORDER — LORAZEPAM 1 MG PO TABS: 1.0000 mg | ORAL_TABLET | Freq: Three times a day (TID) | ORAL | 0 refills | Status: AC | PRN

## 2019-11-15 NOTE — Discharge Summary (Signed)
Discharge Summary  Cody [name] [id number] DOB: 1960/06/18  PCP: Maury Dus, MD  Admit date: 11/14/2019 Discharge date: 11/15/2019  Time spent: 55mins, more than 50% time spent on coordination of care.  Case discussed with neurology, hospice and wife.  Recommendations for Outpatient Follow-up:  1. F/u with home hospice .  Discharge Diagnoses:  Active Hospital Problems   Diagnosis Date Noted  . Seizure ([place]) 11/14/2019    Resolved Hospital Problems  No resolved problems to display.    Discharge Condition: stable  Diet recommendation: Diet as tolerated  Filed Weights   11/14/19 0413  Weight: 81.6 kg    History of present illness: (Per admitting provider Dr. Marylyn Ishihara) PCP: Maury Dus, MD  Patient coming from: Home  Chief Complaint: Seizures  HPI: Cody [name] is a 59 y.o. male with medical history significant of glioblastoma, seizures.    Hx from wife. Pt fell yesterday but was able to get up and move around. No head injury. Was normal for the majority of the day. Around 1830 hrs, the patient seemed to decline. He developed right side weakness and he couldn't feed himself. He attempted to get up to go to the restroom but couldn't stand to walk. By 2300 hrs, he started having seizures. Lasted 10 seconds at first, but then started prolonging. Had 6 seizures in a row. Pt's wife called hospice and was advised to call back if he worsened. He continued to have seizures and a grand mal seiziure was noted. She then called 911.    ED Course: Found to have seizures. He was given a dose of decadron, ativan, and a loading dose of fosphenytoin. TRH was called for admission.  Hospital Course:  Active Problems:   Seizure ([place])   Seizure likely secondary to brain mass with history of left occipital glioblastoma -Received fosphenytoinx1  in the ED -He initially received IV Keppra, now he is able to take oral meds transition to oral Keppra 1500 mg twice  daily -Case discussed with neurology who recommended increase Decadron and start Vimpat, case discussed with hospice who stated likely Vimpat will not be covered, will order Ativan as needed -Patient is seizure-free for 24 hours in the hospital, he desires to go home, wife confirmed patient to remain full comfort measures and would like to him to go home as well -Hospice service will continue close follow-up with the patient  COVID-19 positive -No respiratory symptoms, he is currently on room air, no hypoxia -Wife confirmed COVID-19 positive as well   Procedures:  None  Consultations:  Phone conversation with neurology  Phone conversation with hospice service  Discharge Exam: BP (!) 152/96 (BP Location: Left Arm)   Pulse 63   Temp 97.8 F (36.6 C) (Oral)   Resp 17   Ht 6' (1.829 m)   Wt 81.6 kg   SpO2 97%   BMI 24.41 kg/m   General: Alert and interactive, appear weak Cardiovascular: RRR Respiratory: Normal respiratory effort  Discharge Instructions You were cared for by a hospitalist during your hospital stay. If you have any questions about your discharge medications or the care you received while you were in the hospital after you are discharged, you can call the unit and asked to speak with the hospitalist on call if the hospitalist that took care of you is not available. Once you are discharged, your primary care physician will handle any further medical issues. Please note that NO REFILLS for any discharge medications will be authorized once you are  discharged, as it is imperative that you return to your primary care physician (or establish a relationship with a primary care physician if you do not have one) for your aftercare needs so that they can reassess your need for medications and monitor your lab values.  Discharge Instructions    Diet general   Complete by: As directed    Increase activity slowly   Complete by: As directed      Allergies as of 11/15/2019    No Known Allergies     Medication List    STOP taking these medications   methylphenidate 5 MG tablet Commonly known as: Ritalin     TAKE these medications   acetaminophen 500 MG tablet Commonly known as: TYLENOL Take 500 mg by mouth every 6 (six) hours as needed for headache.   dexamethasone 4 MG tablet Commonly known as: DECADRON Take 1 tablet (4 mg total) by mouth 3 (three) times daily. What changed: when to take this   finasteride 5 MG tablet Commonly known as: PROSCAR Take 5 mg by mouth daily.   HYDROcodone-acetaminophen 5-325 MG tablet Commonly known as: NORCO/VICODIN Take 1 tablet by mouth every 6 (six) hours as needed for moderate pain. Headaches   lacosamide 50 MG Tabs tablet Commonly known as: Vimpat Take 1 tablet (50 mg total) by mouth 2 (two) times daily.   levETIRAcetam 500 MG tablet Commonly known as: KEPPRA TAKE 3 TABLETS(1500 MG) BY MOUTH TWICE DAILY What changed:   how much to take  how to take this  when to take this  additional instructions   LORazepam 1 MG tablet Commonly known as: Ativan Take 1 tablet (1 mg total) by mouth every 8 (eight) hours as needed for anxiety or seizure.   oxyCODONE 5 MG immediate release tablet Commonly known as: Oxy IR/ROXICODONE Take 1 tablet (5 mg total) by mouth every 4 (four) hours as needed for severe pain.   tamsulosin 0.4 MG Caps capsule Commonly known as: FLOMAX Take 0.4 mg by mouth daily.      No Known Allergies    The results of significant diagnostics from this hospitalization (including imaging, microbiology, ancillary and laboratory) are listed below for reference.    Significant Diagnostic Studies: CT Head Wo Contrast  Result Date: 11/14/2019 CLINICAL DATA:  Seizure activity. History of glioblastoma status post resection and radiation. EXAM: CT HEAD WITHOUT CONTRAST TECHNIQUE: Contiguous axial images were obtained from the base of the skull through the vertex without intravenous  contrast. COMPARISON:  Head CT 09/28/2019 and MRI 09/20/2019 FINDINGS: Brain: Sequelae of left parieto-occipital craniotomy are again identified. A hypoattenuating mass is again seen in the left occipital lobe with peripheral calcification. Masslike hypoattenuation extending into the splenium of the corpus callosum and across the midline to the right has progressed from the prior CT, and hypoattenuation extending anteriorly in the left parietal lobe may have also mildly progressed. There is no midline shift or other significant mass effect. No acute cortically based infarct, intracranial hemorrhage, or extra-axial fluid collection is identified. Encephalomalacia is again noted anteriorly in both frontal lobes. The ventricles are unchanged in size. Vascular: No hyperdense vessel. Skull: No acute fracture or suspicious osseous lesion. Sinuses/Orbits: Visualized paranasal sinuses and mastoid air cells are clear. Unremarkable orbits. Other: None. IMPRESSION: 1. Left occipital glioblastoma with increased masslike hypoattenuation in the splenium of the corpus callosum suggesting progressive tumor. 2. No evidence of acute intracranial abnormality. Electronically Signed   By: Logan Bores M.D.   On: 11/14/2019 05:29  Microbiology: Recent Results (from the past 240 hour(s))  Respiratory Panel by RT PCR (Flu A&B, Covid) - Nasopharyngeal Swab     Status: Abnormal   Collection Time: 11/14/19  5:09 AM   Specimen: Nasopharyngeal Swab  Result Value Ref Range Status   SARS Coronavirus 2 by RT PCR POSITIVE (A) NEGATIVE Final    Comment: CRITICAL RESULT CALLED TO, READ BACK BY AND VERIFIED WITH: Mallie Mussel, D. RN @0719  ON 10.12.2021 BY NMCCOY (NOTE) SARS-CoV-2 target nucleic acids are DETECTED.  SARS-CoV-2 RNA is generally detectable in upper respiratory specimens  during the acute phase of infection. Positive results are indicative of the presence of the identified virus, but do not rule out bacterial infection or  co-infection with other pathogens not detected by the test. Clinical correlation with patient history and other diagnostic information is necessary to determine patient infection status. The expected result is Negative.  Fact Sheet for Patients:  PinkCheek.be  Fact Sheet for Healthcare Providers: GravelBags.it  This test is not yet approved or cleared by the [place] FDA and  has been authorized for detection and/or diagnosis of SARS-CoV-2 by FDA under an Emergency Use Authorization (EUA).  This EUA will remain in effect (meaning  this test can be used) for the duration of  the COVID-19 declaration under Section 564(b)(1) of the Act, 21 U.S.C. section 360bbb-3(b)(1), unless the authorization is terminated or revoked sooner.      Influenza A by PCR NEGATIVE NEGATIVE Final   Influenza B by PCR NEGATIVE NEGATIVE Final    Comment: (NOTE) The Xpert Xpress SARS-CoV-2/FLU/RSV assay is intended as an aid in  the diagnosis of influenza from Nasopharyngeal swab specimens and  should not be used as a sole basis for treatment. Nasal washings and  aspirates are unacceptable for Xpert Xpress SARS-CoV-2/FLU/RSV  testing.  Fact Sheet for Patients: PinkCheek.be  Fact Sheet for Healthcare Providers: GravelBags.it  This test is not yet approved or cleared by the [place] FDA and  has been authorized for detection and/or diagnosis of SARS-CoV-2 by  FDA under an Emergency Use Authorization (EUA). This EUA will remain  in effect (meaning this test can be used) for the duration of the  Covid-19 declaration under Section 564(b)(1) of the Act, 21  U.S.C. section 360bbb-3(b)(1), unless the authorization is  terminated or revoked. Performed at [facility] [place] [address] [place], [place] [address]      Labs: Basic Metabolic Panel: No results for  input(s): NA, K, CL, CO2, GLUCOSE, BUN, CREATININE, CALCIUM, MG, PHOS in the last 168 hours. Liver Function Tests: No results for input(s): AST, ALT, ALKPHOS, BILITOT, PROT, ALBUMIN in the last 168 hours. No results for input(s): LIPASE, AMYLASE in the last 168 hours. No results for input(s): AMMONIA in the last 168 hours. CBC: No results for input(s): WBC, NEUTROABS, HGB, HCT, MCV, PLT in the last 168 hours. Cardiac Enzymes: No results for input(s): CKTOTAL, CKMB, CKMBINDEX, TROPONINI in the last 168 hours. BNP: BNP (last 3 results) No results for input(s): BNP in the last 8760 hours.  ProBNP (last 3 results) No results for input(s): PROBNP in the last 8760 hours.  CBG: No results for input(s): GLUCAP in the last 168 hours.     Signed:  Florencia Reasons MD, PhD, FACP  [facility] 11/15/2019, 3:00 PM

## 2019-11-15 NOTE — Progress Notes (Signed)
Wife called RN to let her know she went and got tested due to pt incidentally testing positive yesterday. Wife's test came back positive. She is an Visual merchandiser, however, this RN confirmed with Agricultural consultant, Marissa, and let the wife, Solmon Ice, know that she would not be permitted to come visit since she did test positive. Wife verbalized understanding and confirmed she could come pick him up if he is discharged today. RN informed wife I would update her as I got information about discharge.

## 2019-11-15 NOTE — Plan of Care (Signed)
Patient remain somnolent but responsive to voice and painful stimuli. No episode of seizures overnight. Morphine IV given PRN for pain. IVF infusing @ 72ml/hr. Seizure and covid precautions continued.    Problem: Education: Goal: Knowledge of General Education information will improve Description: Including pain rating scale, medication(s)/side effects and non-pharmacologic comfort measures Outcome: Progressing   Problem: Health Behavior/Discharge Planning: Goal: Ability to manage health-related needs will improve Outcome: Progressing   Problem: Clinical Measurements: Goal: Ability to maintain clinical measurements within normal limits will improve Outcome: Progressing Goal: Will remain free from infection Outcome: Progressing Goal: Diagnostic test results will improve Outcome: Progressing Goal: Respiratory complications will improve Outcome: Progressing Goal: Cardiovascular complication will be avoided Outcome: Progressing   Problem: Activity: Goal: Risk for activity intolerance will decrease Outcome: Progressing   Problem: Nutrition: Goal: Adequate nutrition will be maintained Outcome: Progressing   Problem: Coping: Goal: Level of anxiety will decrease Outcome: Progressing   Problem: Elimination: Goal: Will not experience complications related to bowel motility Outcome: Progressing Goal: Will not experience complications related to urinary retention Outcome: Progressing   Problem: Pain Managment: Goal: General experience of comfort will improve Outcome: Progressing   Problem: Safety: Goal: Ability to remain free from injury will improve Outcome: Progressing   Problem: Skin Integrity: Goal: Risk for impaired skin integrity will decrease Outcome: Progressing

## 2019-11-15 NOTE — Progress Notes (Signed)
RN performed bedside swallow on patient per Dr. Phoebe Sharps request. Pt tolerated well with no issues. Pt drank 240cc of water and ate 1/2 cup of applesauce.

## 2019-11-15 NOTE — Progress Notes (Signed)
RN at bedside. Pt trying to throw his legs over bed and get up. RN placed bed alarms back on. Pt has nonskid socks on and seizure padding still in place but no seizure activity noted this shift. Pt reports "I'm trying." Pt reports he is ready to go home and see his wife. No other needs at this time.

## 2019-11-15 NOTE — Telephone Encounter (Signed)
Received vm message from pt's hospice nurse. She states pt was admitted to [facility] through the ED yesterday for uncontrolled seizures. His seizures are under control at this time and he is being discharged home this afternoon. Hospice nurse wanted to make sure that Dr. Mickeal Skinner is aware of this and aware of the patient discharge seizure medications. She wanted to review his seizure discharge meds.  Unclear if hospice will pay for Vimpat.  His case manager, Debroah Baller' will call on Thursday, 11/16/19

## 2019-11-15 NOTE — TOC Progression Note (Signed)
Transition of Care [facility]) - Progression Note    Patient Details  Name: TRANQUILINO FISCHLER MRN: [id number] Date of Birth: [birth year]  [facility] of [facility] CM/SW Contact  Purcell Mouton, RN Phone Number: 11/15/2019, 3:12 PM  Clinical Narrative:    Charisse Klinefelter was called.         Expected Discharge Plan and Services           Expected Discharge Date: 11/15/19                                     Social Determinants of Health (SDOH) Interventions    Readmission Risk Interventions No flowsheet data found.

## 2019-11-15 NOTE — Progress Notes (Signed)
Manufacturing engineer ([facility]) Community Based Palliative Care       This patient receives hospice services in the community through [facility].  Spoke with pt's spouse Solmon Ice to discuss goals for patient.  Felicia reports she prefers for pt to be dc'ed home if medically appropriate.  TOC made aware.    [facility] will continue to follow for any discharge planning needs and to coordinate continuation of hospice care.  Thank you for the opportunity to participate in this patient's care.     Domenic Moras, BSN, RN [facility] Liaison   [phone] (24h on call)

## 2019-11-15 NOTE — Progress Notes (Signed)
RN called pt's wife, Solmon Ice, to review discharge paperwork with her regarding medications. All questions addressed. RN informed her to make sure and get the d/c packet from EMS upon arrival to her home. Felicia verbalized understanding. Pt's IV's were removed prior to discharge. No other needs at this time.

## 2019-11-16 ENCOUNTER — Telehealth: Payer: Self-pay | Admitting: *Deleted

## 2019-11-16 ENCOUNTER — Other Ambulatory Visit: Payer: Self-pay | Admitting: Internal Medicine

## 2019-11-16 MED ORDER — ZONISAMIDE 100 MG PO CAPS: 100.0000 mg | ORAL_CAPSULE | Freq: Every day | ORAL | 3 refills | Status: AC

## 2019-11-16 NOTE — Telephone Encounter (Signed)
Patient was discharged from hospital on vimpat which out of pocket was going to be $700.00.  Per Dr. Mickeal Skinner approved switch to Zonisamide 100 daily instead of Vimpat.  Wife and hospice both made aware.[name]

## 2019-11-20 ENCOUNTER — Other Ambulatory Visit: Payer: Self-pay | Admitting: Internal Medicine

## 2019-11-20 ENCOUNTER — Telehealth: Payer: Self-pay

## 2019-11-20 MED ORDER — LEVETIRACETAM 100 MG/ML PO SOLN: 1500.0000 mg | Freq: Two times a day (BID) | ORAL | 3 refills | Status: AC

## 2019-11-20 NOTE — Telephone Encounter (Signed)
Patient's hospice nurse Mikayla called and stated patient is starting to have difficulty swallowing. Requesting a new prescription for Keprra oral solution. Dr. Mickeal Skinner made aware and Dr. Mickeal Skinner sent in a new prescription for [place]. Mikayla made aware and verbalized understanding.

## 2019-11-22 ENCOUNTER — Telehealth: Payer: Self-pay | Admitting: *Deleted

## 2019-11-22 NOTE — Telephone Encounter (Addendum)
Patient is now not able to swallow his meds, even crushed. Hospice RN is requesting script for liquid morphine solution for him. Send to [facility] at [facility]. [address].  Called and spoke w/his wife, who reports he takes 1-2 of the oxyir 5 mg/day. He has not taken one today and said this request can wait till Dr. Mickeal Skinner is in the office tomorrow. Hospice RN thinks he is making transition now to dying and most likely will begin to need more pain medication. Requests she be called tomorrow with what MD orders for him.

## 2019-11-23 ENCOUNTER — Other Ambulatory Visit: Payer: Self-pay | Admitting: Internal Medicine

## 2019-11-23 MED ORDER — MORPHINE SULFATE (CONCENTRATE) 10 MG /0.5 ML PO SOLN: 20.0000 mg | ORAL | 0 refills | Status: AC | PRN

## 2019-11-28 ENCOUNTER — Telehealth: Payer: Self-pay | Admitting: *Deleted

## 2019-11-28 NOTE — Telephone Encounter (Signed)
Received notification from [facility] that patient deceased 12/15/19

## 2019-12-04 DEATH — deceased

## 2019-12-22 ENCOUNTER — Telehealth: Payer: Self-pay

## 2019-12-22 NOTE — Telephone Encounter (Signed)
Levada Dy with [facility] called to advise Dr. Mickeal Skinner is approved and has been sent the login information for [facility] to complete the pts death certificate.
